# Patient Record
Sex: Male | Born: 1935 | Race: White | Hispanic: No | Marital: Married | Smoking: Never smoker
Health system: Southern US, Community
[De-identification: ages and names within clinical notes are randomized; demographics above are authoritative.]

## PROBLEM LIST (undated history)

## (undated) DIAGNOSIS — M21379 Foot drop, unspecified foot: Secondary | ICD-10-CM

## (undated) DIAGNOSIS — N400 Enlarged prostate without lower urinary tract symptoms: Secondary | ICD-10-CM

## (undated) DIAGNOSIS — C801 Malignant (primary) neoplasm, unspecified: Secondary | ICD-10-CM

## (undated) DIAGNOSIS — G629 Polyneuropathy, unspecified: Secondary | ICD-10-CM

## (undated) DIAGNOSIS — I5032 Chronic diastolic (congestive) heart failure: Secondary | ICD-10-CM

## (undated) DIAGNOSIS — K759 Inflammatory liver disease, unspecified: Secondary | ICD-10-CM

## (undated) DIAGNOSIS — I499 Cardiac arrhythmia, unspecified: Secondary | ICD-10-CM

## (undated) DIAGNOSIS — Z86718 Personal history of other venous thrombosis and embolism: Secondary | ICD-10-CM

## (undated) DIAGNOSIS — M779 Enthesopathy, unspecified: Secondary | ICD-10-CM

## (undated) DIAGNOSIS — N529 Male erectile dysfunction, unspecified: Secondary | ICD-10-CM

## (undated) DIAGNOSIS — M6740 Ganglion, unspecified site: Secondary | ICD-10-CM

## (undated) DIAGNOSIS — R0600 Dyspnea, unspecified: Secondary | ICD-10-CM

## (undated) DIAGNOSIS — J189 Pneumonia, unspecified organism: Secondary | ICD-10-CM

## (undated) DIAGNOSIS — J9621 Acute and chronic respiratory failure with hypoxia: Secondary | ICD-10-CM

## (undated) DIAGNOSIS — J986 Disorders of diaphragm: Secondary | ICD-10-CM

## (undated) DIAGNOSIS — E854 Organ-limited amyloidosis: Secondary | ICD-10-CM

## (undated) DIAGNOSIS — I482 Chronic atrial fibrillation, unspecified: Secondary | ICD-10-CM

## (undated) DIAGNOSIS — I2510 Atherosclerotic heart disease of native coronary artery without angina pectoris: Secondary | ICD-10-CM

## (undated) HISTORY — PX: BACK SURGERY: SHX140

## (undated) HISTORY — DX: Foot drop, unspecified foot: M21.379

## (undated) HISTORY — PX: PEG TUBE PLACEMENT: SUR1034

## (undated) HISTORY — PX: TONSILLECTOMY: SUR1361

## (undated) HISTORY — DX: Male erectile dysfunction, unspecified: N52.9

## (undated) HISTORY — PX: EYE SURGERY: SHX253

## (undated) HISTORY — DX: Enthesopathy, unspecified: M77.9

## (undated) HISTORY — PX: JOINT REPLACEMENT: SHX530

## (undated) HISTORY — DX: Benign prostatic hyperplasia without lower urinary tract symptoms: N40.0

## (undated) HISTORY — DX: Polyneuropathy, unspecified: G62.9

## (undated) HISTORY — DX: Ganglion, unspecified site: M67.40

## (undated) HISTORY — PX: OTHER SURGICAL HISTORY: SHX169

## (undated) HISTORY — PX: TRACHEOSTOMY: SUR1362

## (undated) HISTORY — DX: Atherosclerotic heart disease of native coronary artery without angina pectoris: I25.10

## (undated) HISTORY — PX: ASCENDING AORTIC ANEURYSM REPAIR: SHX1191

---

## 2019-02-15 HISTORY — PX: CARDIAC CATHETERIZATION: SHX172

## 2019-02-22 HISTORY — PX: CORONARY ARTERY BYPASS GRAFT: SHX141

## 2019-02-22 HISTORY — PX: ASCENDING AORTIC ANEURYSM REPAIR: SHX1191

## 2019-03-13 DIAGNOSIS — I639 Cerebral infarction, unspecified: Secondary | ICD-10-CM

## 2019-03-13 HISTORY — DX: Cerebral infarction, unspecified: I63.9

## 2019-06-04 ENCOUNTER — Other Ambulatory Visit (HOSPITAL_COMMUNITY): Payer: Medicare Other

## 2019-06-04 ENCOUNTER — Inpatient Hospital Stay
Admission: RE | Admit: 2019-06-04 | Discharge: 2019-07-10 | Disposition: A | Discharge status: Rehabilitation Facility | Payer: Medicare Other | Source: Other Acute Inpatient Hospital | Attending: Internal Medicine | Admitting: Internal Medicine

## 2019-06-04 DIAGNOSIS — J9690 Respiratory failure, unspecified, unspecified whether with hypoxia or hypercapnia: Secondary | ICD-10-CM

## 2019-06-04 DIAGNOSIS — R509 Fever, unspecified: Secondary | ICD-10-CM

## 2019-06-04 DIAGNOSIS — I482 Chronic atrial fibrillation, unspecified: Secondary | ICD-10-CM | POA: Diagnosis present

## 2019-06-04 DIAGNOSIS — J986 Disorders of diaphragm: Secondary | ICD-10-CM | POA: Diagnosis present

## 2019-06-04 DIAGNOSIS — Z86718 Personal history of other venous thrombosis and embolism: Secondary | ICD-10-CM

## 2019-06-04 DIAGNOSIS — I5032 Chronic diastolic (congestive) heart failure: Secondary | ICD-10-CM | POA: Diagnosis present

## 2019-06-04 DIAGNOSIS — J9621 Acute and chronic respiratory failure with hypoxia: Secondary | ICD-10-CM | POA: Diagnosis present

## 2019-06-04 DIAGNOSIS — Z931 Gastrostomy status: Secondary | ICD-10-CM

## 2019-06-04 DIAGNOSIS — R52 Pain, unspecified: Secondary | ICD-10-CM

## 2019-06-04 HISTORY — DX: Disorders of diaphragm: J98.6

## 2019-06-04 HISTORY — DX: Chronic diastolic (congestive) heart failure: I50.32

## 2019-06-04 HISTORY — DX: Chronic atrial fibrillation, unspecified: I48.20

## 2019-06-04 HISTORY — DX: Personal history of other venous thrombosis and embolism: Z86.718

## 2019-06-04 HISTORY — DX: Acute and chronic respiratory failure with hypoxia: J96.21

## 2019-06-04 LAB — BLOOD GAS, ARTERIAL
Acid-Base Excess: 5.5 mmol/L — ABNORMAL HIGH (ref 0.0–2.0)
Bicarbonate: 29.4 mmol/L — ABNORMAL HIGH (ref 20.0–28.0)
FIO2: 40
O2 Saturation: 97.7 %
Patient temperature: 37
pCO2 arterial: 41.9 mmHg (ref 32.0–48.0)
pH, Arterial: 7.46 — ABNORMAL HIGH (ref 7.350–7.450)
pO2, Arterial: 102 mmHg (ref 83.0–108.0)

## 2019-06-05 ENCOUNTER — Other Ambulatory Visit (HOSPITAL_COMMUNITY): Payer: Medicare Other

## 2019-06-05 DIAGNOSIS — I5032 Chronic diastolic (congestive) heart failure: Secondary | ICD-10-CM | POA: Diagnosis not present

## 2019-06-05 DIAGNOSIS — I482 Chronic atrial fibrillation, unspecified: Secondary | ICD-10-CM

## 2019-06-05 DIAGNOSIS — J986 Disorders of diaphragm: Secondary | ICD-10-CM

## 2019-06-05 DIAGNOSIS — J9621 Acute and chronic respiratory failure with hypoxia: Secondary | ICD-10-CM | POA: Diagnosis not present

## 2019-06-05 DIAGNOSIS — Z86718 Personal history of other venous thrombosis and embolism: Secondary | ICD-10-CM

## 2019-06-05 LAB — CBC
HCT: 29.8 % — ABNORMAL LOW (ref 39.0–52.0)
Hemoglobin: 8.7 g/dL — ABNORMAL LOW (ref 13.0–17.0)
MCH: 26.6 pg (ref 26.0–34.0)
MCHC: 29.2 g/dL — ABNORMAL LOW (ref 30.0–36.0)
MCV: 91.1 fL (ref 80.0–100.0)
Platelets: 303 10*3/uL (ref 150–400)
RBC: 3.27 MIL/uL — ABNORMAL LOW (ref 4.22–5.81)
RDW: 18.2 % — ABNORMAL HIGH (ref 11.5–15.5)
WBC: 6.1 10*3/uL (ref 4.0–10.5)
nRBC: 0 % (ref 0.0–0.2)

## 2019-06-05 LAB — COMPREHENSIVE METABOLIC PANEL
ALT: 36 U/L (ref 0–44)
AST: 40 U/L (ref 15–41)
Albumin: 2.4 g/dL — ABNORMAL LOW (ref 3.5–5.0)
Alkaline Phosphatase: 158 U/L — ABNORMAL HIGH (ref 38–126)
Anion gap: 10 (ref 5–15)
BUN: 20 mg/dL (ref 8–23)
CO2: 29 mmol/L (ref 22–32)
Calcium: 8.9 mg/dL (ref 8.9–10.3)
Chloride: 96 mmol/L — ABNORMAL LOW (ref 98–111)
Creatinine, Ser: 0.67 mg/dL (ref 0.61–1.24)
GFR calc Af Amer: >60 mL/min (ref >60)
GFR calc non Af Amer: >60 mL/min (ref >60)
Glucose, Bld: 99 mg/dL (ref 70–99)
Potassium: 4.1 mmol/L (ref 3.5–5.1)
Sodium: 135 mmol/L (ref 135–145)
Total Bilirubin: 0.4 mg/dL (ref 0.3–1.2)
Total Protein: 6.4 g/dL — ABNORMAL LOW (ref 6.5–8.1)

## 2019-06-05 LAB — PROTIME-INR
INR: 1.1 (ref 0.8–1.2)
Prothrombin Time: 14.1 seconds (ref 11.4–15.2)

## 2019-06-05 LAB — MAGNESIUM: Magnesium: 1.8 mg/dL (ref 1.7–2.4)

## 2019-06-05 LAB — TSH: TSH: 2.456 u[IU]/mL (ref 0.350–4.500)

## 2019-06-05 LAB — APTT: aPTT: 44 seconds — ABNORMAL HIGH (ref 24–36)

## 2019-06-05 MED ORDER — DEXTROMETHORPHAN POLISTIREX ER 30 MG/5ML PO SUER
30.00 | ORAL | Status: DC
Start: ? — End: 2019-06-05

## 2019-06-05 MED ORDER — SODIUM CHLORIDE 0.9 % IV SOLN
0.00 | INTRAVENOUS | Status: DC
Start: ? — End: 2019-06-05

## 2019-06-05 MED ORDER — POTASSIUM CHLORIDE CRYS ER 10 MEQ PO TBCR
20.00 | EXTENDED_RELEASE_TABLET | ORAL | Status: DC
Start: ? — End: 2019-06-05

## 2019-06-05 MED ORDER — GENERIC EXTERNAL MEDICATION
Status: DC
Start: ? — End: 2019-06-05

## 2019-06-05 MED ORDER — POTASSIUM CHLORIDE 20 MEQ/100ML IV SOLN
40.00 | INTRAVENOUS | Status: DC
Start: ? — End: 2019-06-05

## 2019-06-05 MED ORDER — ATORVASTATIN CALCIUM 80 MG PO TABS
80.00 | ORAL_TABLET | ORAL | Status: DC
Start: 2019-06-05 — End: 2019-06-05

## 2019-06-05 MED ORDER — SENNOSIDES 8.6 MG PO TABS
2.00 | ORAL_TABLET | ORAL | Status: DC
Start: ? — End: 2019-06-05

## 2019-06-05 MED ORDER — POTASSIUM CHLORIDE 40 MEQ/100ML IV SOLN
40.00 | INTRAVENOUS | Status: DC
Start: ? — End: 2019-06-05

## 2019-06-05 MED ORDER — DEXTROSE 5 % IV SOLN
42.00 | INTRAVENOUS | Status: DC
Start: ? — End: 2019-06-05

## 2019-06-05 MED ORDER — GUAIFENESIN 100 MG/5ML PO SYRP
10.00 | ORAL_SOLUTION | ORAL | Status: DC
Start: ? — End: 2019-06-05

## 2019-06-05 MED ORDER — POLYETHYLENE GLYCOL 3350 17 GM/SCOOP PO POWD
17.00 | ORAL | Status: DC
Start: 2019-06-05 — End: 2019-06-05

## 2019-06-05 MED ORDER — ENOXAPARIN SODIUM 40 MG/0.4ML ~~LOC~~ SOLN
40.00 | SUBCUTANEOUS | Status: DC
Start: 2019-06-05 — End: 2019-06-05

## 2019-06-05 MED ORDER — POTASSIUM CHLORIDE 20 MEQ PO PACK
40.00 | PACK | ORAL | Status: DC
Start: ? — End: 2019-06-05

## 2019-06-05 MED ORDER — MELATONIN 3 MG PO TABS
3.00 | ORAL_TABLET | ORAL | Status: DC
Start: ? — End: 2019-06-05

## 2019-06-05 MED ORDER — ASPIRIN 81 MG PO CHEW
81.00 | CHEWABLE_TABLET | ORAL | Status: DC
Start: 2019-06-05 — End: 2019-06-05

## 2019-06-05 MED ORDER — CARBOXYMETHYLCELLULOSE SODIUM 1 % OP GEL
1.00 | OPHTHALMIC | Status: DC
Start: ? — End: 2019-06-05

## 2019-06-05 MED ORDER — POTASSIUM CHLORIDE 20 MEQ/100ML IV SOLN
20.00 | INTRAVENOUS | Status: DC
Start: ? — End: 2019-06-05

## 2019-06-05 MED ORDER — GENERIC EXTERNAL MEDICATION
15.00 | Status: DC
Start: ? — End: 2019-06-05

## 2019-06-05 MED ORDER — GENERIC EXTERNAL MEDICATION
30.00 | Status: DC
Start: 2019-06-05 — End: 2019-06-05

## 2019-06-05 MED ORDER — ONDANSETRON HCL 4 MG/2ML IJ SOLN
4.00 | INTRAMUSCULAR | Status: DC
Start: ? — End: 2019-06-05

## 2019-06-05 MED ORDER — GENERIC EXTERNAL MEDICATION
12.50 | Status: DC
Start: 2019-06-04 — End: 2019-06-05

## 2019-06-05 MED ORDER — POTASSIUM CHLORIDE 20 MEQ PO PACK
20.00 | PACK | ORAL | Status: DC
Start: ? — End: 2019-06-05

## 2019-06-05 MED ORDER — DEXTROSE 50 % IV SOLN
12.50 | INTRAVENOUS | Status: DC
Start: ? — End: 2019-06-05

## 2019-06-05 MED ORDER — FINASTERIDE 5 MG PO TABS
5.00 | ORAL_TABLET | ORAL | Status: DC
Start: 2019-06-05 — End: 2019-06-05

## 2019-06-05 MED ORDER — IPRATROPIUM-ALBUTEROL 0.5-2.5 (3) MG/3ML IN SOLN
3.00 | RESPIRATORY_TRACT | Status: DC
Start: ? — End: 2019-06-05

## 2019-06-05 MED ORDER — POTASSIUM CHLORIDE CRYS ER 10 MEQ PO TBCR
40.00 | EXTENDED_RELEASE_TABLET | ORAL | Status: DC
Start: ? — End: 2019-06-05

## 2019-06-05 MED ORDER — DEXTROSE 50 % IV SOLN
25.00 | INTRAVENOUS | Status: DC
Start: ? — End: 2019-06-05

## 2019-06-05 MED ORDER — MAGNESIUM SULFATE 2 GM/50ML IV SOLN
6.00 | INTRAVENOUS | Status: DC
Start: ? — End: 2019-06-05

## 2019-06-05 MED ORDER — MAGNESIUM SULFATE 2 GM/50ML IV SOLN
2.00 | INTRAVENOUS | Status: DC
Start: ? — End: 2019-06-05

## 2019-06-05 MED ORDER — FUROSEMIDE 20 MG PO TABS
20.00 | ORAL_TABLET | ORAL | Status: DC
Start: 2019-06-05 — End: 2019-06-05

## 2019-06-05 MED ORDER — GLUCAGON (RDNA) 1 MG IJ KIT
1.00 | PACK | INTRAMUSCULAR | Status: DC
Start: ? — End: 2019-06-05

## 2019-06-05 MED ORDER — ACETAMINOPHEN 325 MG PO TABS
650.00 | ORAL_TABLET | ORAL | Status: DC
Start: ? — End: 2019-06-05

## 2019-06-05 MED ORDER — CHLORHEXIDINE GLUCONATE 0.12 % MT SOLN
15.00 | OROMUCOSAL | Status: DC
Start: 2019-06-04 — End: 2019-06-05

## 2019-06-05 MED ORDER — METOCLOPRAMIDE HCL 5 MG PO TABS
10.00 | ORAL_TABLET | ORAL | Status: DC
Start: 2019-06-04 — End: 2019-06-05

## 2019-06-05 MED ORDER — MAGNESIUM SULFATE 4 GM/100ML IV SOLN
4.00 | INTRAVENOUS | Status: DC
Start: ? — End: 2019-06-05

## 2019-06-05 NOTE — Consult Note (Signed)
Pulmonary Critical Care Medicine Travis Brewer: 06/05/2019  PULMONARY CRITICAL CARE [name]  [name]  DOB: 1934-10-18   DOA: 06/04/2019  Referring Physician: Merton Border, MD  HPI: Travis Brewer is a 85 y.o. male seen for follow up of Acute on Chronic Respiratory Failure.  Patient has multiple medical problems including ascending aortic aneurysm atrial fibrillation melanoma syncope pneumonia delirium who was admitted to the hospital after a 5.3 cm ascending aortic aneurysm repair and coronary artery disease with a history of a CABG.  Patient apparently had been having some issues with syncope was found to have multivessel coronary artery disease as well as the aneurysm underwent a CABG as well as repair complicated by a carbon dioxide embolism from the vein harvest.  He developed atrial fibrillation postoperatively also requiring amiodarone.  Patient also had respiratory failure with increased PCO2 and required ongoing intubation and chemical ventilation.  Patient at that time had been discharged was readmitted on December 4 for low sodium and was felt to be in volume overload with SIADH.  Fluid restriction was done and patient also was placed on the ventilator.  Basically has not been able to come off of the ventilator he also apparently had a sniff test showing a right hemidiaphragm paralysis and left hemidiaphragm which was greatly diminished and movement suggesting paresis.  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  Patient currently has been on pressure support wean and we are trying to gradually increase this time.  Review of Systems:  ROS performed and is unremarkable other than noted above.  Past Medical History:  Diagnosis Date  . Acute respiratory failure requiring reintubation 03/06/2019  . Arthritis  . Ascending aortic aneurysm 02/22/2019  .  Atrial fibrillation 02/2019  . Melanoma 2010  left shoulder  . Near syncope  . Palliative care by specialist 04/03/2019  . Pneumonia 19454  pt was 85 years old  . Postoperative delirium 02/2019  . Rotator cuff dysfunction, left 1995   Past Surgical History:  Procedure Laterality Date  . ASCENDING AORTIC ANEURYSM REPAIR N/A 02/22/2019  Procedure: REPAIR, ANEURYSM, AORTA, ASCENDING, EVH; Surgeon: Travis Cork, MD; Location: [facility] ZBOR 5; Brewer: Cardiac Surgery; Laterality: N/A;  . BACK SURGERY 2010  . CORONARY ARTERIOGRAPHY N/A 02/15/2019  Procedure: Coronary Arteriography; Surgeon: Travis Garin, MD; Location: [facility] CVIL; Brewer: Cardiac Catheterization; Laterality: N/A;  . CORONARY ARTERY BYPASS GRAFT N/A 02/22/2019  Procedure: CABG (CORONARY ARTERY BYPASS GRAFT; Surgeon: Travis Cork, MD; Location: [facility] 5; Brewer: Cardiac Surgery; Laterality: N/A;  . FOOT BONE SPUR EXCISION  . GANGLION CYST EXCISION Left  . KNEE REPLACEMENT TOTAL Bilateral left knee 2012, right knee 2020   Immunization History: There is no immunization history on file for this patient.  Social History Social History   Tobacco Use  . Smoking status: Never Smoker  . Smokeless tobacco: Never Used  Substance Use Topics  . Alcohol use: Yes  Comment: 10 alcoholic beverages a week  . Drug use: Not on file    Family History  Problem Relation Age of Onset  . Stroke Father  . Diabetes Brother    Medications: Reviewed on Rounds  Physical Exam:  Vitals: Temperature is 98.4 pulse 98 respiratory rate 16 blood pressure is 102/64 saturations 100%  Ventilator Settings mode of ventilation assist control FiO2 40% tidal volume 400 PEEP 5 respiratory rate set at 16  .  General: Comfortable at this time . Eyes: Grossly normal lids, irises & conjunctiva . ENT: grossly tongue is normal . Neck: no obvious mass . Cardiovascular: S1-S2 normal no gallop or rub . Respiratory: No rhonchi no rales are noted at  this time . Abdomen: Soft and nontender . Skin: no rash seen on limited exam . Musculoskeletal: not rigid . Psychiatric:unable to assess . Neurologic: no seizure no involuntary movements         Labs on Admission:  Basic Metabolic Panel: Recent Labs  Lab 06/05/19 0612  NA 135  K 4.1  CL 96*  CO2 29  GLUCOSE 99  BUN 20  CREATININE 0.67  CALCIUM 8.9  MG 1.8    Recent Labs  Lab 06/04/19 1932  PHART 7.460*  PCO2ART 41.9  PO2ART 102  HCO3 29.4*  O2SAT 97.7    Liver Function Tests: Recent Labs  Lab 06/05/19 0612  AST 40  ALT 36  ALKPHOS 158*  BILITOT 0.4  PROT 6.4*  ALBUMIN 2.4*   No results for input(s): LIPASE, AMYLASE in the last 168 hours. No results for input(s): AMMONIA in the last 168 hours.  CBC: Recent Labs  Lab 06/05/19 0612  WBC 6.1  HGB 8.7*  HCT 29.8*  MCV 91.1  PLT 303    Cardiac Enzymes: No results for input(s): CKTOTAL, CKMB, CKMBINDEX, TROPONINI in the last 168 hours.  BNP (last 3 results) No results for input(s): BNP in the last 8760 hours.  ProBNP (last 3 results) No results for input(s): PROBNP in the last 8760 hours.   Radiological Exams on Admission: DG ABDOMEN PEG TUBE LOCATION  Result Date: 06/04/2019 CLINICAL DATA:  PEG placement. EXAM: ABDOMEN - 1 VIEW COMPARISON:  None. FINDINGS: Enteric tube administered through indwelling gastrostomy tube. Contrast opacifies the stomach, duodenum, proximal small bowel. No evidence of extravasation or leak. Air-filled but nondilated small and large bowel in the abdomen. Bilateral pleural effusions suspected at the lung bases. IMPRESSION: Enteric tube in the stomach.  No evidence of extravasation or leak. Electronically Signed   By: Travis Brewer M.D.   On: 06/04/2019 20:25   DG CHEST PORT 1 VIEW  Result Date: 06/05/2019 CLINICAL DATA:  Respiratory failure, tracheostomy EXAM: PORTABLE CHEST 1 VIEW COMPARISON:  None FINDINGS: Tracheostomy tube terminates in the mid trachea.  Postsurgical changes related to prior CABG including intact and aligned sternotomy wires and multiple surgical clips projecting over the mediastinum. Mild cardiomegaly. There are hazy interstitial opacities with indistinct vascularity which could reflect a combination of edema and atelectasis. Small bilateral effusions. No pneumothorax. No acute osseous or soft tissue abnormality. Moderate to severe degenerative changes are noted in both shoulders, left greater than right. IMPRESSION: Mild cardiomegaly with hazy interstitial opacities which could reflect a combination of edema and atelectasis. Small bilateral effusions. Electronically Signed   By: Lovena Le M.D.   On: 06/05/2019 06:43    Assessment/Plan Active Problems:   Acute on chronic respiratory failure with hypoxia [facility])   Diaphragmatic paralysis   History of DVT of lower extremity   Chronic congestive heart failure with left ventricular diastolic dysfunction (HCC)   Chronic atrial fibrillation ([place])   1. Acute on chronic respiratory failure with hypoxia patient is right now on full support on the ventilator.  Chest x-ray shows some interstitial opacities noted plan is going to be to monitor the patient's fluid status diurese as tolerated and I will check the RSB I and try to begin with the weaning protocol.  Multiple  factors are involved here including paresis of his diaphragm with phrenic nerve damage also. 2. Chronic atrial fibrillation rate controlled patient has been on amiodarone which was ultimately taken off. 3. History of DVT patient had a right popliteal and left posterior tibial DVT but was not able to be anticoagulated because of angiopathy. 4. Chronic congestive heart failure preserved ejection fraction patient has been diuresed we will continue with the diuretics and monitor closely last chest x-ray does show cardiomegaly with some increasing interstitial opacities. 5. Diaphragmatic paralysis this is going to be difficult in  terms of being able to wean the patient will see how patient does ultimately it may end up that the patient will require some form of ventilatory support likely at nighttime  I have personally seen and evaluated the patient, evaluated laboratory and imaging results, formulated the assessment and plan and placed orders. The Patient requires high complexity decision making with multiple systems involvement.  Case was discussed on Rounds with the Respiratory Therapy Director and the Respiratory staff Time Spent 66minutes  Kyvon Hu A Etta Gassett, MD [facility] Pulmonary Critical Care Medicine Sleep Medicine

## 2019-06-06 ENCOUNTER — Other Ambulatory Visit (HOSPITAL_COMMUNITY): Payer: Medicare Other

## 2019-06-06 ENCOUNTER — Encounter: Payer: Self-pay | Admitting: Internal Medicine

## 2019-06-06 DIAGNOSIS — I5032 Chronic diastolic (congestive) heart failure: Secondary | ICD-10-CM | POA: Diagnosis present

## 2019-06-06 DIAGNOSIS — J986 Disorders of diaphragm: Secondary | ICD-10-CM | POA: Diagnosis present

## 2019-06-06 DIAGNOSIS — I482 Chronic atrial fibrillation, unspecified: Secondary | ICD-10-CM | POA: Diagnosis not present

## 2019-06-06 DIAGNOSIS — J9621 Acute and chronic respiratory failure with hypoxia: Secondary | ICD-10-CM | POA: Diagnosis not present

## 2019-06-06 DIAGNOSIS — Z86718 Personal history of other venous thrombosis and embolism: Secondary | ICD-10-CM

## 2019-06-06 NOTE — Progress Notes (Addendum)
Pulmonary Critical Care Medicine [place]   PULMONARY CRITICAL CARE SERVICE  PROGRESS NOTE  Date of Service: 06/06/2019  Travis Brewer  [id number]  DOB: May 22, 1934   DOA: 06/04/2019  Referring Physician: Merton Border, MD  HPI: Travis Brewer is a 85 y.o. male seen for follow up of Acute on Chronic Respiratory Failure.  Patient remains on pressure support this time 25 FiO2 40% for 12-hour goal satting well no fever distress.  Medications: Reviewed on Rounds  Physical Exam:  Vitals: Pulse 91 respirations 20 BP 97/50 O2 sat 100% temp 98.8  Ventilator Settings pressure support 12/5 FiO2 40%  . General: Comfortable at this time . Eyes: Grossly normal lids, irises & conjunctiva . ENT: grossly tongue is normal . Neck: no obvious mass . Cardiovascular: S1 S2 normal no gallop . Respiratory: No rales or rhonchi noted . Abdomen: soft . Skin: no rash seen on limited exam . Musculoskeletal: not rigid . Psychiatric:unable to assess . Neurologic: no seizure no involuntary movements         Lab Data:   Basic Metabolic Panel: Recent Labs  Lab 06/05/19 0612  NA 135  K 4.1  CL 96*  CO2 29  GLUCOSE 99  BUN 20  CREATININE 0.67  CALCIUM 8.9  MG 1.8    ABG: Recent Labs  Lab 06/04/19 1932  PHART 7.460*  PCO2ART 41.9  PO2ART 102  HCO3 29.4*  O2SAT 97.7    Liver Function Tests: Recent Labs  Lab 06/05/19 0612  AST 40  ALT 36  ALKPHOS 158*  BILITOT 0.4  PROT 6.4*  ALBUMIN 2.4*   No results for input(s): LIPASE, AMYLASE in the last 168 hours. No results for input(s): AMMONIA in the last 168 hours.  CBC: Recent Labs  Lab 06/05/19 0612  WBC 6.1  HGB 8.7*  HCT 29.8*  MCV 91.1  PLT 303    Cardiac Enzymes: No results for input(s): CKTOTAL, CKMB, CKMBINDEX, TROPONINI in the last 168 hours.  BNP (last 3 results) No results for input(s): BNP in the last 8760 hours.  ProBNP (last 3 results) No results for input(s): PROBNP in the last  8760 hours.  Radiological Exams: DG ABDOMEN PEG TUBE LOCATION  Result Date: 06/04/2019 CLINICAL DATA:  PEG placement. EXAM: ABDOMEN - 1 VIEW COMPARISON:  None. FINDINGS: Enteric tube administered through indwelling gastrostomy tube. Contrast opacifies the stomach, duodenum, proximal small bowel. No evidence of extravasation or leak. Air-filled but nondilated small and large bowel in the abdomen. Bilateral pleural effusions suspected at the lung bases. IMPRESSION: Enteric tube in the stomach.  No evidence of extravasation or leak. Electronically Signed   By: Keith Rake M.D.   On: 06/04/2019 20:25   DG CHEST PORT 1 VIEW  Result Date: 06/06/2019 CLINICAL DATA:  Follow-up pleural effusions. EXAM: PORTABLE CHEST 1 VIEW COMPARISON:  06/05/2019 FINDINGS: The cardiac silhouette, mediastinal and hilar contours are within normal limits and stable given the AP projection and portable technique. The tracheostomy tube is in good position, unchanged. Persistent small right pleural effusion and bibasilar atelectasis. No definite infiltrates or edema. No pneumothorax. Stable advanced degenerative changes involving both shoulders. IMPRESSION: Persistent small right pleural effusion and bibasilar atelectasis. Electronically Signed   By: Marijo Sanes M.D.   On: 06/06/2019 16:21   DG CHEST PORT 1 VIEW  Result Date: 06/05/2019 CLINICAL DATA:  Respiratory failure, tracheostomy EXAM: PORTABLE CHEST 1 VIEW COMPARISON:  None FINDINGS: Tracheostomy tube terminates in the mid trachea. Postsurgical changes related  to prior CABG including intact and aligned sternotomy wires and multiple surgical clips projecting over the mediastinum. Mild cardiomegaly. There are hazy interstitial opacities with indistinct vascularity which could reflect a combination of edema and atelectasis. Small bilateral effusions. No pneumothorax. No acute osseous or soft tissue abnormality. Moderate to severe degenerative changes are noted in both  shoulders, left greater than right. IMPRESSION: Mild cardiomegaly with hazy interstitial opacities which could reflect a combination of edema and atelectasis. Small bilateral effusions. Electronically Signed   By: Lovena Le M.D.   On: 06/05/2019 06:43    Assessment/Plan Active Problems:   * No active hospital problems. *   1. Acute on chronic respiratory failure with hypoxia patient remains on full support on the ventilator at this time satting well we will continue supportive measures and consider weaning when patient is able. 2. Chronic atrial fibrillation rate controlled continue to follow 3. History of DVT patient had right popliteal and left posterior tibial DVT was not able to be anticoagulated because of angiopathy. 4. Chronic congestive heart failure with preserved ejection fraction has been diuresed continue to monitor closely 5. Diaphragmatic paralysis will make weaning difficult from the ventilator.  Continue to monitor closely.   I have personally seen and evaluated the patient, evaluated laboratory and imaging results, formulated the assessment and plan and placed orders. The Patient requires high complexity decision making with multiple systems involvement.  Rounds were done with the Respiratory Therapy Director and Staff therapists and discussed with nursing staff also.  Allyne Gee, MD [facility] Pulmonary Critical Care Medicine Sleep Medicine

## 2019-06-07 DIAGNOSIS — I482 Chronic atrial fibrillation, unspecified: Secondary | ICD-10-CM | POA: Diagnosis not present

## 2019-06-07 DIAGNOSIS — J986 Disorders of diaphragm: Secondary | ICD-10-CM | POA: Diagnosis not present

## 2019-06-07 DIAGNOSIS — I5032 Chronic diastolic (congestive) heart failure: Secondary | ICD-10-CM | POA: Diagnosis not present

## 2019-06-07 DIAGNOSIS — J9621 Acute and chronic respiratory failure with hypoxia: Secondary | ICD-10-CM | POA: Diagnosis not present

## 2019-06-07 LAB — BASIC METABOLIC PANEL
Anion gap: 9 (ref 5–15)
BUN: 27 mg/dL — ABNORMAL HIGH (ref 8–23)
CO2: 32 mmol/L (ref 22–32)
Calcium: 8.7 mg/dL — ABNORMAL LOW (ref 8.9–10.3)
Chloride: 96 mmol/L — ABNORMAL LOW (ref 98–111)
Creatinine, Ser: 0.65 mg/dL (ref 0.61–1.24)
GFR calc Af Amer: >60 mL/min (ref >60)
GFR calc non Af Amer: >60 mL/min (ref >60)
Glucose, Bld: 130 mg/dL — ABNORMAL HIGH (ref 70–99)
Potassium: 4.3 mmol/L (ref 3.5–5.1)
Sodium: 137 mmol/L (ref 135–145)

## 2019-06-07 LAB — CBC
HCT: 27.2 % — ABNORMAL LOW (ref 39.0–52.0)
Hemoglobin: 8.1 g/dL — ABNORMAL LOW (ref 13.0–17.0)
MCH: 27.1 pg (ref 26.0–34.0)
MCHC: 29.8 g/dL — ABNORMAL LOW (ref 30.0–36.0)
MCV: 91 fL (ref 80.0–100.0)
Platelets: 252 10*3/uL (ref 150–400)
RBC: 2.99 MIL/uL — ABNORMAL LOW (ref 4.22–5.81)
RDW: 18.4 % — ABNORMAL HIGH (ref 11.5–15.5)
WBC: 5.7 10*3/uL (ref 4.0–10.5)
nRBC: 0 % (ref 0.0–0.2)

## 2019-06-07 NOTE — Progress Notes (Signed)
Pulmonary Critical Care Medicine [place]   PULMONARY CRITICAL CARE SERVICE  PROGRESS NOTE  Date of Service: 06/07/2019  Travis Brewer  [id number]  DOB: Jan 03, 1935   DOA: 06/04/2019  Referring Physician: Merton Border, MD  HPI: Travis Brewer is a 85 y.o. male seen for follow up of Acute on Chronic Respiratory Failure.  Patient currently is on full support pressure support wean 12/5  Medications: Reviewed on Rounds  Physical Exam:  Vitals: Temperature is 100.2 pulse 90 respiratory rate 22 blood pressure is one 4/80 saturations 96%  Ventilator Settings mode of ventilation pressure support FiO2 30% tidal volume is 350 pressure poor 12 PEEP 5  . General: Comfortable at this time . Eyes: Grossly normal lids, irises & conjunctiva . ENT: grossly tongue is normal . Neck: no obvious mass . Cardiovascular: S1 S2 normal no gallop . Respiratory: No rhonchi coarse breath sounds . Abdomen: soft . Skin: no rash seen on limited exam . Musculoskeletal: not rigid . Psychiatric:unable to assess . Neurologic: no seizure no involuntary movements         Lab Data:   Basic Metabolic Panel: Recent Labs  Lab 06/05/19 0612 06/07/19 0703  NA 135 137  K 4.1 4.3  CL 96* 96*  CO2 29 32  GLUCOSE 99 130*  BUN 20 27*  CREATININE 0.67 0.65  CALCIUM 8.9 8.7*  MG 1.8  --     ABG: Recent Labs  Lab 06/04/19 1932  PHART 7.460*  PCO2ART 41.9  PO2ART 102  HCO3 29.4*  O2SAT 97.7    Liver Function Tests: Recent Labs  Lab 06/05/19 0612  AST 40  ALT 36  ALKPHOS 158*  BILITOT 0.4  PROT 6.4*  ALBUMIN 2.4*   No results for input(s): LIPASE, AMYLASE in the last 168 hours. No results for input(s): AMMONIA in the last 168 hours.  CBC: Recent Labs  Lab 06/05/19 0612 06/07/19 0703  WBC 6.1 5.7  HGB 8.7* 8.1*  HCT 29.8* 27.2*  MCV 91.1 91.0  PLT 303 252    Cardiac Enzymes: No results for input(s): CKTOTAL, CKMB, CKMBINDEX, TROPONINI in the last 168  hours.  BNP (last 3 results) No results for input(s): BNP in the last 8760 hours.  ProBNP (last 3 results) No results for input(s): PROBNP in the last 8760 hours.  Radiological Exams: DG CHEST PORT 1 VIEW  Result Date: 06/06/2019 CLINICAL DATA:  Follow-up pleural effusions. EXAM: PORTABLE CHEST 1 VIEW COMPARISON:  06/05/2019 FINDINGS: The cardiac silhouette, mediastinal and hilar contours are within normal limits and stable given the AP projection and portable technique. The tracheostomy tube is in good position, unchanged. Persistent small right pleural effusion and bibasilar atelectasis. No definite infiltrates or edema. No pneumothorax. Stable advanced degenerative changes involving both shoulders. IMPRESSION: Persistent small right pleural effusion and bibasilar atelectasis. Electronically Signed   By: Marijo Sanes M.D.   On: 06/06/2019 16:21    Assessment/Plan Active Problems:   Acute on chronic respiratory failure with hypoxia (HCC)   Diaphragmatic paralysis   History of DVT of lower extremity   Chronic congestive heart failure with left ventricular diastolic dysfunction (HCC)   Chronic atrial fibrillation ([place])   1. Acute on chronic respiratory failure hypoxia plan is to continue with pressure support as ordered titrate oxygen continue pulmonary toilet. 2. Diaphragmatic paralysis this will be difficult in terms of being able to be completely liberated from the ventilator 3. DVT treated 4. Congestive heart failure compensated we'll continue to monitor.  Last chest x-ray showing small right-sided pleural effusions 5. Chronic atrial fibrillation right now rate controlled   I have personally seen and evaluated the patient, evaluated laboratory and imaging results, formulated the assessment and plan and placed orders. The Patient requires high complexity decision making with multiple systems involvement.  Rounds were done with the Respiratory Therapy Director and Staff therapists  and discussed with nursing staff also.  Allyne Gee, MD [facility] Pulmonary Critical Care Medicine Sleep Medicine

## 2019-06-08 DIAGNOSIS — J986 Disorders of diaphragm: Secondary | ICD-10-CM | POA: Diagnosis not present

## 2019-06-08 DIAGNOSIS — I5032 Chronic diastolic (congestive) heart failure: Secondary | ICD-10-CM | POA: Diagnosis not present

## 2019-06-08 DIAGNOSIS — J9621 Acute and chronic respiratory failure with hypoxia: Secondary | ICD-10-CM | POA: Diagnosis not present

## 2019-06-08 DIAGNOSIS — I482 Chronic atrial fibrillation, unspecified: Secondary | ICD-10-CM | POA: Diagnosis not present

## 2019-06-08 MED ORDER — GENERIC EXTERNAL MEDICATION
Status: DC
Start: ? — End: 2019-06-08

## 2019-06-08 NOTE — Progress Notes (Addendum)
Pulmonary Critical Care Medicine [place]   PULMONARY CRITICAL CARE SERVICE  PROGRESS NOTE  Date of Service: 06/08/2019  Travis Brewer  [id number]  DOB: 1934-06-25   DOA: 06/04/2019  Referring Physician: Merton Border, MD  HPI: Travis Brewer is a 85 y.o. male seen for follow up of Acute on Chronic Respiratory Failure.  Patient did 1/2 hours on aerosol trach collar 35% today is now back on pressure support 12/5 FiO2 40% satting well no fever distress.  Medications: Reviewed on Rounds  Physical Exam:  Vitals: Pulse 89 respirations 22 BP 137/95 O2 sat 100% temp 100.5  Ventilator Settings pressure support 12/5 FiO2 40%  . General: Comfortable at this time . Eyes: Grossly normal lids, irises & conjunctiva . ENT: grossly tongue is normal . Neck: no obvious mass . Cardiovascular: S1 S2 normal no gallop . Respiratory: No rales or rhonchi noted . Abdomen: soft . Skin: no rash seen on limited exam . Musculoskeletal: not rigid . Psychiatric:unable to assess . Neurologic: no seizure no involuntary movements         Lab Data:   Basic Metabolic Panel: Recent Labs  Lab 06/05/19 0612 06/07/19 0703  NA 135 137  K 4.1 4.3  CL 96* 96*  CO2 29 32  GLUCOSE 99 130*  BUN 20 27*  CREATININE 0.67 0.65  CALCIUM 8.9 8.7*  MG 1.8  --     ABG: Recent Labs  Lab 06/04/19 1932  PHART 7.460*  PCO2ART 41.9  PO2ART 102  HCO3 29.4*  O2SAT 97.7    Liver Function Tests: Recent Labs  Lab 06/05/19 0612  AST 40  ALT 36  ALKPHOS 158*  BILITOT 0.4  PROT 6.4*  ALBUMIN 2.4*   No results for input(s): LIPASE, AMYLASE in the last 168 hours. No results for input(s): AMMONIA in the last 168 hours.  CBC: Recent Labs  Lab 06/05/19 0612 06/07/19 0703  WBC 6.1 5.7  HGB 8.7* 8.1*  HCT 29.8* 27.2*  MCV 91.1 91.0  PLT 303 252    Cardiac Enzymes: No results for input(s): CKTOTAL, CKMB, CKMBINDEX, TROPONINI in the last 168 hours.  BNP (last 3 results) No  results for input(s): BNP in the last 8760 hours.  ProBNP (last 3 results) No results for input(s): PROBNP in the last 8760 hours.  Radiological Exams: No results found.  Assessment/Plan Active Problems:   Acute on chronic respiratory failure with hypoxia (HCC)   Diaphragmatic paralysis   History of DVT of lower extremity   Chronic congestive heart failure with left ventricular diastolic dysfunction (HCC)   Chronic atrial fibrillation ([place])   1. Acute on chronic respiratory failure hypoxia plan is to continue weaning per protocol.  Did 1/2 hours on ATC today currently on pressure support mode.  Continue supportive measures and pulmonary toilet. 2. Diaphragmatic paralysis this will be difficult in terms of being able to be completely liberated from the ventilator 3. DVT treated 4. Congestive heart failure compensated we'll continue to monitor.  Last chest x-ray showing small right-sided pleural effusions 5. Chronic atrial fibrillation right now rate controlled   I have personally seen and evaluated the patient, evaluated laboratory and imaging results, formulated the assessment and plan and placed orders. The Patient requires high complexity decision making with multiple systems involvement.  Rounds were done with the Respiratory Therapy Director and Staff therapists and discussed with nursing staff also.  Allyne Gee, MD [facility] Pulmonary Critical Care Medicine Sleep Medicine

## 2019-06-09 DIAGNOSIS — J986 Disorders of diaphragm: Secondary | ICD-10-CM | POA: Diagnosis not present

## 2019-06-09 DIAGNOSIS — J9621 Acute and chronic respiratory failure with hypoxia: Secondary | ICD-10-CM | POA: Diagnosis not present

## 2019-06-09 DIAGNOSIS — I482 Chronic atrial fibrillation, unspecified: Secondary | ICD-10-CM | POA: Diagnosis not present

## 2019-06-09 DIAGNOSIS — I5032 Chronic diastolic (congestive) heart failure: Secondary | ICD-10-CM | POA: Diagnosis not present

## 2019-06-09 LAB — BASIC METABOLIC PANEL
Anion gap: 9 (ref 5–15)
BUN: 32 mg/dL — ABNORMAL HIGH (ref 8–23)
CO2: 30 mmol/L (ref 22–32)
Calcium: 8.5 mg/dL — ABNORMAL LOW (ref 8.9–10.3)
Chloride: 99 mmol/L (ref 98–111)
Creatinine, Ser: 0.76 mg/dL (ref 0.61–1.24)
GFR calc Af Amer: >60 mL/min (ref >60)
GFR calc non Af Amer: >60 mL/min (ref >60)
Glucose, Bld: 121 mg/dL — ABNORMAL HIGH (ref 70–99)
Potassium: 4.2 mmol/L (ref 3.5–5.1)
Sodium: 138 mmol/L (ref 135–145)

## 2019-06-09 LAB — CBC
HCT: 25.5 % — ABNORMAL LOW (ref 39.0–52.0)
Hemoglobin: 7.4 g/dL — ABNORMAL LOW (ref 13.0–17.0)
MCH: 26.3 pg (ref 26.0–34.0)
MCHC: 29 g/dL — ABNORMAL LOW (ref 30.0–36.0)
MCV: 90.7 fL (ref 80.0–100.0)
Platelets: 200 10*3/uL (ref 150–400)
RBC: 2.81 MIL/uL — ABNORMAL LOW (ref 4.22–5.81)
RDW: 18.7 % — ABNORMAL HIGH (ref 11.5–15.5)
WBC: 5.8 10*3/uL (ref 4.0–10.5)
nRBC: 0 % (ref 0.0–0.2)

## 2019-06-09 NOTE — Progress Notes (Addendum)
Pulmonary Critical Care Medicine [place]   PULMONARY CRITICAL CARE SERVICE  PROGRESS NOTE  Date of Service: 06/09/2019  CHANON CROES  [id number]  DOB: 10/23/1934   DOA: 06/04/2019  Referring Physician: Merton Border, MD  HPI: JONATHEN CALLUM is a 85 y.o. male seen for follow up of Acute on Chronic Respiratory Failure.  Patient did 6 hours today on aerosol trach collar 40% FiO2 and is now resting back on pressure support satting well no fever or distress.  Medications: Reviewed on Rounds  Physical Exam:  Vitals: Pulse 96 respirations 32 BP 102/51 O2 sat 90% temp 98.7  Ventilator Settings pressure support over 5 FiO2 35%  . General: Comfortable at this time . Eyes: Grossly normal lids, irises & conjunctiva . ENT: grossly tongue is normal . Neck: no obvious mass . Cardiovascular: S1 S2 normal no gallop . Respiratory: No rales or rhonchi noted . Abdomen: soft . Skin: no rash seen on limited exam . Musculoskeletal: not rigid . Psychiatric:unable to assess . Neurologic: no seizure no involuntary movements         Lab Data:   Basic Metabolic Panel: Recent Labs  Lab 06/05/19 0612 06/07/19 0703 06/09/19 0659  NA 135 137 138  K 4.1 4.3 4.2  CL 96* 96* 99  CO2 29 32 30  GLUCOSE 99 130* 121*  BUN 20 27* 32*  CREATININE 0.67 0.65 0.76  CALCIUM 8.9 8.7* 8.5*  MG 1.8  --   --     ABG: Recent Labs  Lab 06/04/19 1932  PHART 7.460*  PCO2ART 41.9  PO2ART 102  HCO3 29.4*  O2SAT 97.7    Liver Function Tests: Recent Labs  Lab 06/05/19 0612  AST 40  ALT 36  ALKPHOS 158*  BILITOT 0.4  PROT 6.4*  ALBUMIN 2.4*   No results for input(s): LIPASE, AMYLASE in the last 168 hours. No results for input(s): AMMONIA in the last 168 hours.  CBC: Recent Labs  Lab 06/05/19 0612 06/07/19 0703 06/09/19 0659  WBC 6.1 5.7 5.8  HGB 8.7* 8.1* 7.4*  HCT 29.8* 27.2* 25.5*  MCV 91.1 91.0 90.7  PLT 303 252 200    Cardiac Enzymes: No results for  input(s): CKTOTAL, CKMB, CKMBINDEX, TROPONINI in the last 168 hours.  BNP (last 3 results) No results for input(s): BNP in the last 8760 hours.  ProBNP (last 3 results) No results for input(s): PROBNP in the last 8760 hours.  Radiological Exams: No results found.  Assessment/Plan Active Problems:   Acute on chronic respiratory failure with hypoxia (HCC)   Diaphragmatic paralysis   History of DVT of lower extremity   Chronic congestive heart failure with left ventricular diastolic dysfunction (HCC)   Chronic atrial fibrillation ([place])   1. Acute on chronic respiratory failure hypoxia plan is to continue with pressure support as ordered titrate oxygen continue pulmonary toilet. 2. Diaphragmatic paralysis this will be difficult in terms of being able to be completely liberated from the ventilator 3. DVT treated 4. Congestive heart failure compensated we'll continue to monitor.  Last chest x-ray showing small right-sided pleural effusions 5. Chronic atrial fibrillation right now rate controlled   I have personally seen and evaluated the patient, evaluated laboratory and imaging results, formulated the assessment and plan and placed orders. The Patient requires high complexity decision making with multiple systems involvement.  Rounds were done with the Respiratory Therapy Director and Staff therapists and discussed with nursing staff also.  Allyne Gee, MD [facility]  Pulmonary Critical Care Medicine Sleep Medicine

## 2019-06-10 DIAGNOSIS — J986 Disorders of diaphragm: Secondary | ICD-10-CM | POA: Diagnosis not present

## 2019-06-10 DIAGNOSIS — J9621 Acute and chronic respiratory failure with hypoxia: Secondary | ICD-10-CM | POA: Diagnosis not present

## 2019-06-10 DIAGNOSIS — I482 Chronic atrial fibrillation, unspecified: Secondary | ICD-10-CM | POA: Diagnosis not present

## 2019-06-10 DIAGNOSIS — I5032 Chronic diastolic (congestive) heart failure: Secondary | ICD-10-CM | POA: Diagnosis not present

## 2019-06-10 LAB — CULTURE, RESPIRATORY W GRAM STAIN

## 2019-06-10 NOTE — Progress Notes (Signed)
Pulmonary Critical Care Medicine [place]   PULMONARY CRITICAL CARE SERVICE  PROGRESS NOTE  Date of Service: 06/10/2019  REYES KASTER  [name]  DOB: 1934-10-10   DOA: 06/04/2019  Referring Physician: Merton Border, MD  HPI: Travis WINSTANLEY is a 85 y.o. male seen for follow up of Acute on Chronic Respiratory Failure.  Patient is on T collar at this time on 35% FiO2 good saturations are noted at the goal is for about 12 hours of weaning today  Medications: Reviewed on Rounds  Physical Exam:  Vitals: Temperature is 98.4 pulse 70 respiratory 21 blood pressure is 95/58 saturations 100%  Ventilator Settings on T collar with an FiO2 35%  . General: Comfortable at this time . Eyes: Grossly normal lids, irises & conjunctiva . ENT: grossly tongue is normal . Neck: no obvious mass . Cardiovascular: S1 S2 normal no gallop . Respiratory: No rhonchi no rales are noted at this time . Abdomen: soft . Skin: no rash seen on limited exam . Musculoskeletal: not rigid . Psychiatric:unable to assess . Neurologic: no seizure no involuntary movements         Lab Data:   Basic Metabolic Panel: Recent Labs  Lab 06/05/19 0612 06/07/19 0703 06/09/19 0659  NA 135 137 138  K 4.1 4.3 4.2  CL 96* 96* 99  CO2 29 32 30  GLUCOSE 99 130* 121*  BUN 20 27* 32*  CREATININE 0.67 0.65 0.76  CALCIUM 8.9 8.7* 8.5*  MG 1.8  --   --     ABG: Recent Labs  Lab 06/04/19 1932  PHART 7.460*  PCO2ART 41.9  PO2ART 102  HCO3 29.4*  O2SAT 97.7    Liver Function Tests: Recent Labs  Lab 06/05/19 0612  AST 40  ALT 36  ALKPHOS 158*  BILITOT 0.4  PROT 6.4*  ALBUMIN 2.4*   No results for input(s): LIPASE, AMYLASE in the last 168 hours. No results for input(s): AMMONIA in the last 168 hours.  CBC: Recent Labs  Lab 06/05/19 0612 06/07/19 0703 06/09/19 0659  WBC 6.1 5.7 5.8  HGB 8.7* 8.1* 7.4*  HCT 29.8* 27.2* 25.5*  MCV 91.1 91.0 90.7  PLT 303 252 200     Cardiac Enzymes: No results for input(s): CKTOTAL, CKMB, CKMBINDEX, TROPONINI in the last 168 hours.  BNP (last 3 results) No results for input(s): BNP in the last 8760 hours.  ProBNP (last 3 results) No results for input(s): PROBNP in the last 8760 hours.  Radiological Exams: No results found.  Assessment/Plan Active Problems:   Acute on chronic respiratory failure with hypoxia (HCC)   Diaphragmatic paralysis   History of DVT of lower extremity   Chronic congestive heart failure with left ventricular diastolic dysfunction (HCC)   Chronic atrial fibrillation ([place])   1. Acute on chronic respiratory failure hypoxia continue with T collar trials currently on 35% FiO2 titrate oxygen continue pulmonary toilet 2. Diaphragmatic paralysis no change we will continue with supportive care 3. History of DVT treated we will continue to follow 4. Chronic congestive heart failure with left ventricular diastolic dysfunction monitor fluid status 5. Chronic atrial fibrillation rate is controlled at this time   I have personally seen and evaluated the patient, evaluated laboratory and imaging results, formulated the assessment and plan and placed orders. The Patient requires high complexity decision making with multiple systems involvement.  Rounds were done with the Respiratory Therapy Director and Staff therapists and discussed with nursing staff also.  Hendricks Schwandt  Richardson Dopp, MD [facility] Pulmonary Critical Care Medicine Sleep Medicine

## 2019-06-11 DIAGNOSIS — J9621 Acute and chronic respiratory failure with hypoxia: Secondary | ICD-10-CM | POA: Diagnosis not present

## 2019-06-11 DIAGNOSIS — I482 Chronic atrial fibrillation, unspecified: Secondary | ICD-10-CM | POA: Diagnosis not present

## 2019-06-11 DIAGNOSIS — I5032 Chronic diastolic (congestive) heart failure: Secondary | ICD-10-CM | POA: Diagnosis not present

## 2019-06-11 DIAGNOSIS — J986 Disorders of diaphragm: Secondary | ICD-10-CM | POA: Diagnosis not present

## 2019-06-11 LAB — BLOOD GAS, ARTERIAL
Acid-Base Excess: 7.2 mmol/L — ABNORMAL HIGH (ref 0.0–2.0)
Bicarbonate: 31.6 mmol/L — ABNORMAL HIGH (ref 20.0–28.0)
FIO2: 40
O2 Saturation: 97.6 %
Patient temperature: 37
pCO2 arterial: 48.4 mmHg — ABNORMAL HIGH (ref 32.0–48.0)
pH, Arterial: 7.43 (ref 7.350–7.450)
pO2, Arterial: 112 mmHg — ABNORMAL HIGH (ref 83.0–108.0)

## 2019-06-11 NOTE — Progress Notes (Signed)
Pulmonary Critical Care Medicine [place]   PULMONARY CRITICAL CARE SERVICE  PROGRESS NOTE  Date of Service: 06/11/2019  Travis Brewer  [name]  DOB: June 27, 1934   DOA: 06/04/2019  Referring Physician: Merton Border, MD  HPI: Travis Brewer is a 85 y.o. male seen for follow up of Acute on Chronic Respiratory Failure.  On T collar right now on 40% FiO2 the goal today is for about 12 hours  Medications: Reviewed on Rounds  Physical Exam:  Vitals: Temperature is 98.6 pulse 92 respiratory rate 20 blood pressure is 119/79 saturations 96%  Ventilator Settings off the ventilator on T collar FiO2 40%  . General: Comfortable at this time . Eyes: Grossly normal lids, irises & conjunctiva . ENT: grossly tongue is normal . Neck: no obvious mass . Cardiovascular: S1 S2 normal no gallop . Respiratory: No rhonchi no rales are noted at this time . Abdomen: soft . Skin: no rash seen on limited exam . Musculoskeletal: not rigid . Psychiatric:unable to assess . Neurologic: no seizure no involuntary movements         Lab Data:   Basic Metabolic Panel: Recent Labs  Lab 06/05/19 0612 06/07/19 0703 06/09/19 0659  NA 135 137 138  K 4.1 4.3 4.2  CL 96* 96* 99  CO2 29 32 30  GLUCOSE 99 130* 121*  BUN 20 27* 32*  CREATININE 0.67 0.65 0.76  CALCIUM 8.9 8.7* 8.5*  MG 1.8  --   --     ABG: Recent Labs  Lab 06/04/19 1932  PHART 7.460*  PCO2ART 41.9  PO2ART 102  HCO3 29.4*  O2SAT 97.7    Liver Function Tests: Recent Labs  Lab 06/05/19 0612  AST 40  ALT 36  ALKPHOS 158*  BILITOT 0.4  PROT 6.4*  ALBUMIN 2.4*   No results for input(s): LIPASE, AMYLASE in the last 168 hours. No results for input(s): AMMONIA in the last 168 hours.  CBC: Recent Labs  Lab 06/05/19 0612 06/07/19 0703 06/09/19 0659  WBC 6.1 5.7 5.8  HGB 8.7* 8.1* 7.4*  HCT 29.8* 27.2* 25.5*  MCV 91.1 91.0 90.7  PLT 303 252 200    Cardiac Enzymes: No results for input(s):  CKTOTAL, CKMB, CKMBINDEX, TROPONINI in the last 168 hours.  BNP (last 3 results) No results for input(s): BNP in the last 8760 hours.  ProBNP (last 3 results) No results for input(s): PROBNP in the last 8760 hours.  Radiological Exams: No results found.  Assessment/Plan Active Problems:   Acute on chronic respiratory failure with hypoxia (HCC)   Diaphragmatic paralysis   History of DVT of lower extremity   Chronic congestive heart failure with left ventricular diastolic dysfunction (HCC)   Chronic atrial fibrillation ([place])   1. Acute on chronic respiratory failure with hypoxia we will continue with T collar trials titrate as tolerated continue pulmonary toilet 2. Diaphragmatic paralysis at baseline we will continue to follow along 3. DVT lower extremity treated 4. Chronic congestive heart failure follow fluid status 5. Chronic atrial fibrillation rate controlled   I have personally seen and evaluated the patient, evaluated laboratory and imaging results, formulated the assessment and plan and placed orders. The Patient requires high complexity decision making with multiple systems involvement.  Rounds were done with the Respiratory Therapy Director and Staff therapists and discussed with nursing staff also.  Allyne Gee, MD [facility] Pulmonary Critical Care Medicine Sleep Medicine

## 2019-06-12 DIAGNOSIS — J986 Disorders of diaphragm: Secondary | ICD-10-CM | POA: Diagnosis not present

## 2019-06-12 DIAGNOSIS — I5032 Chronic diastolic (congestive) heart failure: Secondary | ICD-10-CM | POA: Diagnosis not present

## 2019-06-12 DIAGNOSIS — I482 Chronic atrial fibrillation, unspecified: Secondary | ICD-10-CM | POA: Diagnosis not present

## 2019-06-12 DIAGNOSIS — J9621 Acute and chronic respiratory failure with hypoxia: Secondary | ICD-10-CM | POA: Diagnosis not present

## 2019-06-12 NOTE — Progress Notes (Signed)
Pulmonary Critical Care Medicine [place]   PULMONARY CRITICAL CARE SERVICE  PROGRESS NOTE  Date of Service: 06/12/2019  Travis Brewer  [id number]  DOB: 1935/01/16   DOA: 06/04/2019  Referring Physician: Merton Border, MD  HPI: Travis Brewer is a 85 y.o. male seen for follow up of Acute on Chronic Respiratory Failure.  Patient is on T collar now has been on 35% FiO2 the goal is for 16 hours  Medications: Reviewed on Rounds  Physical Exam:  Vitals: Temperature is 97.4 pulse 95 respiratory 20 blood pressure is 106/62 saturations 99%  Ventilator Settings on T collar currently on 35% FiO2  . General: Comfortable at this time . Eyes: Grossly normal lids, irises & conjunctiva . ENT: grossly tongue is normal . Neck: no obvious mass . Cardiovascular: S1 S2 normal no gallop . Respiratory: Coarse breath sounds few rhonchi . Abdomen: soft . Skin: no rash seen on limited exam . Musculoskeletal: not rigid . Psychiatric:unable to assess . Neurologic: no seizure no involuntary movements         Lab Data:   Basic Metabolic Panel: Recent Labs  Lab 06/07/19 0703 06/09/19 0659  NA 137 138  K 4.3 4.2  CL 96* 99  CO2 32 30  GLUCOSE 130* 121*  BUN 27* 32*  CREATININE 0.65 0.76  CALCIUM 8.7* 8.5*    ABG: Recent Labs  Lab 06/11/19 1350  PHART 7.430  PCO2ART 48.4*  PO2ART 112*  HCO3 31.6*  O2SAT 97.6    Liver Function Tests: No results for input(s): AST, ALT, ALKPHOS, BILITOT, PROT, ALBUMIN in the last 168 hours. No results for input(s): LIPASE, AMYLASE in the last 168 hours. No results for input(s): AMMONIA in the last 168 hours.  CBC: Recent Labs  Lab 06/07/19 0703 06/09/19 0659  WBC 5.7 5.8  HGB 8.1* 7.4*  HCT 27.2* 25.5*  MCV 91.0 90.7  PLT 252 200    Cardiac Enzymes: No results for input(s): CKTOTAL, CKMB, CKMBINDEX, TROPONINI in the last 168 hours.  BNP (last 3 results) No results for input(s): BNP in the last 8760  hours.  ProBNP (last 3 results) No results for input(s): PROBNP in the last 8760 hours.  Radiological Exams: No results found.  Assessment/Plan Active Problems:   Acute on chronic respiratory failure with hypoxia (HCC)   Diaphragmatic paralysis   History of DVT of lower extremity   Chronic congestive heart failure with left ventricular diastolic dysfunction (HCC)   Chronic atrial fibrillation ([place])   1. Acute on chronic respiratory failure hypoxia plan is to continue with T collar trials patient is on 35% FiO2 with a goal of 16 hours 2. Diaphragmatic paralysis we are monitoring closely 3. History of DVT treated 4. Chronic congestive heart failure compensated 5. Chronic atrial fibrillation rate controlled we will continue with supportive care   I have personally seen and evaluated the patient, evaluated laboratory and imaging results, formulated the assessment and plan and placed orders. The Patient requires high complexity decision making with multiple systems involvement.  Rounds were done with the Respiratory Therapy Director and Staff therapists and discussed with nursing staff also.  Allyne Gee, MD [facility] Pulmonary Critical Care Medicine Sleep Medicine

## 2019-06-13 DIAGNOSIS — I5032 Chronic diastolic (congestive) heart failure: Secondary | ICD-10-CM | POA: Diagnosis not present

## 2019-06-13 DIAGNOSIS — J9621 Acute and chronic respiratory failure with hypoxia: Secondary | ICD-10-CM | POA: Diagnosis not present

## 2019-06-13 DIAGNOSIS — I482 Chronic atrial fibrillation, unspecified: Secondary | ICD-10-CM | POA: Diagnosis not present

## 2019-06-13 DIAGNOSIS — J986 Disorders of diaphragm: Secondary | ICD-10-CM | POA: Diagnosis not present

## 2019-06-13 NOTE — Progress Notes (Addendum)
Pulmonary Critical Care Medicine [place]   PULMONARY CRITICAL CARE SERVICE  PROGRESS NOTE  Date of Service: 06/13/2019  JULION RIGOLI  [id number]  DOB: 09-07-1934   DOA: 06/04/2019  Referring Physician: Merton Border, MD  HPI: Travis Brewer is a 85 y.o. male seen for follow up of Acute on Chronic Respiratory Failure.  Patient is on T collar currently on 30% FiO2 good saturations are noted  Medications: Reviewed on Rounds  Physical Exam:  Vitals: Temperature is 96.6 pulse 96 respiratory 20 blood pressure is 113/62 saturations 100%  Ventilator Settings off the ventilator on T collar on 30% FiO2  . General: Comfortable at this time . Eyes: Grossly normal lids, irises & conjunctiva . ENT: grossly tongue is normal . Neck: no obvious mass . Cardiovascular: S1 S2 normal no gallop . Respiratory: Coarse rhonchi expansion is equal at this time . Abdomen: soft . Skin: no rash seen on limited exam . Musculoskeletal: not rigid . Psychiatric:unable to assess . Neurologic: no seizure no involuntary movements         Lab Data:   Basic Metabolic Panel: Recent Labs  Lab 06/07/19 0703 06/09/19 0659  NA 137 138  K 4.3 4.2  CL 96* 99  CO2 32 30  GLUCOSE 130* 121*  BUN 27* 32*  CREATININE 0.65 0.76  CALCIUM 8.7* 8.5*    ABG: Recent Labs  Lab 06/11/19 1350  PHART 7.430  PCO2ART 48.4*  PO2ART 112*  HCO3 31.6*  O2SAT 97.6    Liver Function Tests: No results for input(s): AST, ALT, ALKPHOS, BILITOT, PROT, ALBUMIN in the last 168 hours. No results for input(s): LIPASE, AMYLASE in the last 168 hours. No results for input(s): AMMONIA in the last 168 hours.  CBC: Recent Labs  Lab 06/07/19 0703 06/09/19 0659  WBC 5.7 5.8  HGB 8.1* 7.4*  HCT 27.2* 25.5*  MCV 91.0 90.7  PLT 252 200    Cardiac Enzymes: No results for input(s): CKTOTAL, CKMB, CKMBINDEX, TROPONINI in the last 168 hours.  BNP (last 3 results) No results for input(s): BNP in the  last 8760 hours.  ProBNP (last 3 results) No results for input(s): PROBNP in the last 8760 hours.  Radiological Exams: No results found.  Assessment/Plan Active Problems:   Acute on chronic respiratory failure with hypoxia (HCC)   Diaphragmatic paralysis   History of DVT of lower extremity   Chronic congestive heart failure with left ventricular diastolic dysfunction (HCC)   Chronic atrial fibrillation ([place])   1. Acute on chronic respiratory failure hypoxia patient is weaning on T collar will need nocturnal vent support.  Secondary to the diaphragmatic paralysis he will need some form of positive airway pressure 2. Diaphragmatic paralysis at baseline right now nocturnal vent support likely 3. History of DVT treated 4. Chronic congestive heart failure compensated 5. Chronic atrial fibrillation rate controlled   I have personally seen and evaluated the patient, evaluated laboratory and imaging results, formulated the assessment and plan and placed orders. The Patient requires high complexity decision making with multiple systems involvement.  Rounds were done with the Respiratory Therapy Director and Staff therapists and discussed with nursing staff also.  Allyne Gee, MD [facility] Pulmonary Critical Care Medicine Sleep Medicine

## 2019-06-14 DIAGNOSIS — J986 Disorders of diaphragm: Secondary | ICD-10-CM | POA: Diagnosis not present

## 2019-06-14 DIAGNOSIS — I482 Chronic atrial fibrillation, unspecified: Secondary | ICD-10-CM | POA: Diagnosis not present

## 2019-06-14 DIAGNOSIS — J9621 Acute and chronic respiratory failure with hypoxia: Secondary | ICD-10-CM | POA: Diagnosis not present

## 2019-06-14 DIAGNOSIS — I5032 Chronic diastolic (congestive) heart failure: Secondary | ICD-10-CM | POA: Diagnosis not present

## 2019-06-14 LAB — BLOOD GAS, ARTERIAL
Acid-Base Excess: 4.6 mmol/L — ABNORMAL HIGH (ref 0.0–2.0)
Bicarbonate: 28.8 mmol/L — ABNORMAL HIGH (ref 20.0–28.0)
FIO2: 30
O2 Saturation: 92 %
Patient temperature: 36.5
pCO2 arterial: 43.1 mmHg (ref 32.0–48.0)
pH, Arterial: 7.437 (ref 7.350–7.450)
pO2, Arterial: 70.2 mmHg — ABNORMAL LOW (ref 83.0–108.0)

## 2019-06-14 MED ORDER — GENERIC EXTERNAL MEDICATION
Status: DC
Start: ? — End: 2019-06-14

## 2019-06-14 NOTE — Progress Notes (Signed)
Pulmonary Critical Care Medicine [place]   PULMONARY CRITICAL CARE SERVICE  PROGRESS NOTE  Date of Service: 06/14/2019  Travis Brewer  [id number]  DOB: 1935-03-15   DOA: 06/04/2019  Referring Physician: Merton Border, MD  HPI: Travis Brewer is a 85 y.o. male seen for follow up of Acute on Chronic Respiratory Failure.  Patient at this time is on T collar has been on 30% FiO2 the goal today is for 48 hours  Medications: Reviewed on Rounds  Physical Exam:  Vitals: Temperature is 97.8 pulse 122 respiratory 20 blood pressure is 109/68 saturations 97%  Ventilator Settings on T collar within 30% FiO2  . General: Comfortable at this time . Eyes: Grossly normal lids, irises & conjunctiva . ENT: grossly tongue is normal . Neck: no obvious mass . Cardiovascular: S1 S2 normal no gallop . Respiratory: No rhonchi no rales are noted at this time . Abdomen: soft . Skin: no rash seen on limited exam . Musculoskeletal: not rigid . Psychiatric:unable to assess . Neurologic: no seizure no involuntary movements         Lab Data:   Basic Metabolic Panel: Recent Labs  Lab 06/09/19 0659  NA 138  K 4.2  CL 99  CO2 30  GLUCOSE 121*  BUN 32*  CREATININE 0.76  CALCIUM 8.5*    ABG: Recent Labs  Lab 06/11/19 1350 06/14/19 1208  PHART 7.430 7.437  PCO2ART 48.4* 43.1  PO2ART 112* 70.2*  HCO3 31.6* 28.8*  O2SAT 97.6 92.0    Liver Function Tests: No results for input(s): AST, ALT, ALKPHOS, BILITOT, PROT, ALBUMIN in the last 168 hours. No results for input(s): LIPASE, AMYLASE in the last 168 hours. No results for input(s): AMMONIA in the last 168 hours.  CBC: Recent Labs  Lab 06/09/19 0659  WBC 5.8  HGB 7.4*  HCT 25.5*  MCV 90.7  PLT 200    Cardiac Enzymes: No results for input(s): CKTOTAL, CKMB, CKMBINDEX, TROPONINI in the last 168 hours.  BNP (last 3 results) No results for input(s): BNP in the last 8760 hours.  ProBNP (last 3 results) No  results for input(s): PROBNP in the last 8760 hours.  Radiological Exams: No results found.  Assessment/Plan Active Problems:   Acute on chronic respiratory failure with hypoxia (HCC)   Diaphragmatic paralysis   History of DVT of lower extremity   Chronic congestive heart failure with left ventricular diastolic dysfunction (HCC)   Chronic atrial fibrillation ([place])   1. Acute on chronic respiratory failure hypoxia continue T collar trials titrate oxygen as tolerated today will be 48 hours completed 2. Diaphragmatic paralysis no change we will continue to monitor 3. History of DVT continue with supportive care 4. Chronic congestive heart failure with left ventricular diastolic dysfunction 5. Chronic atrial fibrillation rate is controlled at this time   I have personally seen and evaluated the patient, evaluated laboratory and imaging results, formulated the assessment and plan and placed orders. The Patient requires high complexity decision making with multiple systems involvement.  Rounds were done with the Respiratory Therapy Director and Staff therapists and discussed with nursing staff also.  Allyne Gee, MD [facility] Pulmonary Critical Care Medicine Sleep Medicine

## 2019-06-15 ENCOUNTER — Other Ambulatory Visit (HOSPITAL_COMMUNITY): Payer: Medicare Other

## 2019-06-15 DIAGNOSIS — I5032 Chronic diastolic (congestive) heart failure: Secondary | ICD-10-CM | POA: Diagnosis not present

## 2019-06-15 DIAGNOSIS — I482 Chronic atrial fibrillation, unspecified: Secondary | ICD-10-CM | POA: Diagnosis not present

## 2019-06-15 DIAGNOSIS — J986 Disorders of diaphragm: Secondary | ICD-10-CM | POA: Diagnosis not present

## 2019-06-15 DIAGNOSIS — J9621 Acute and chronic respiratory failure with hypoxia: Secondary | ICD-10-CM | POA: Diagnosis not present

## 2019-06-15 MED ORDER — GENERIC EXTERNAL MEDICATION
Status: DC
Start: ? — End: 2019-06-15

## 2019-06-15 NOTE — Progress Notes (Addendum)
Pulmonary Critical Care Medicine [place]   PULMONARY CRITICAL CARE SERVICE  PROGRESS NOTE  Date of Service: 06/15/2019  RICH YAEGER  [name]  DOB: November 06, 1934   DOA: 06/04/2019  Referring Physician: Merton Border, MD  HPI: SHAYE CHUSTZ is a 85 y.o. male seen for follow up of Acute on Chronic Respiratory Failure.  Patient respiratory: As tolerated.  Satting well for distress.  Medications: Reviewed on Rounds  Physical Exam:  Vitals: Pulse 69 respirations 19 BP 90/59 O2 sat 99%  Temp 99.2  Ventilator Settings ATC 40%  . General: Comfortable at this time . Eyes: Grossly normal lids, irises & conjunctiva . ENT: grossly tongue is normal . Neck: no obvious mass . Cardiovascular: S1 S2 normal no gallop . Respiratory: No rales or rhonchi noted . Abdomen: soft . Skin: no rash seen on limited exam . Musculoskeletal: not rigid . Psychiatric:unable to assess . Neurologic: no seizure no involuntary movements         Lab Data:   Basic Metabolic Panel: Recent Labs  Lab 06/09/19 0659  NA 138  K 4.2  CL 99  CO2 30  GLUCOSE 121*  BUN 32*  CREATININE 0.76  CALCIUM 8.5*    ABG: Recent Labs  Lab 06/11/19 1350 06/14/19 1208  PHART 7.430 7.437  PCO2ART 48.4* 43.1  PO2ART 112* 70.2*  HCO3 31.6* 28.8*  O2SAT 97.6 92.0    Liver Function Tests: No results for input(s): AST, ALT, ALKPHOS, BILITOT, PROT, ALBUMIN in the last 168 hours. No results for input(s): LIPASE, AMYLASE in the last 168 hours. No results for input(s): AMMONIA in the last 168 hours.  CBC: Recent Labs  Lab 06/09/19 0659  WBC 5.8  HGB 7.4*  HCT 25.5*  MCV 90.7  PLT 200    Cardiac Enzymes: No results for input(s): CKTOTAL, CKMB, CKMBINDEX, TROPONINI in the last 168 hours.  BNP (last 3 results) No results for input(s): BNP in the last 8760 hours.  ProBNP (last 3 results) No results for input(s): PROBNP in the last 8760 hours.  Radiological Exams: No results  found.  Assessment/Plan Active Problems:   Acute on chronic respiratory failure with hypoxia (HCC)   Diaphragmatic paralysis   History of DVT of lower extremity   Chronic congestive heart failure with left ventricular diastolic dysfunction (HCC)   Chronic atrial fibrillation ([place])   1. Acute on chronic respiratory failure hypoxia continue T collar trials at this time currently on 40% aerosol trach collar.  Patient goes on as tolerated.  Continue supportive measures and pulmonary toilet. 2. Diaphragmatic paralysis no change we will continue to monitor 3. History of DVT continue with supportive care 4. Chronic congestive heart failure with left ventricular diastolic dysfunction 5. Chronic atrial fibrillation rate is controlled at this time   I have personally seen and evaluated the patient, evaluated laboratory and imaging results, formulated the assessment and plan and placed orders. The Patient requires high complexity decision making with multiple systems involvement.  Rounds were done with the Respiratory Therapy Director and Staff therapists and discussed with nursing staff also.  Allyne Gee, MD [facility] Pulmonary Critical Care Medicine Sleep Medicine

## 2019-06-16 DIAGNOSIS — J9621 Acute and chronic respiratory failure with hypoxia: Secondary | ICD-10-CM | POA: Diagnosis not present

## 2019-06-16 DIAGNOSIS — I5032 Chronic diastolic (congestive) heart failure: Secondary | ICD-10-CM | POA: Diagnosis not present

## 2019-06-16 DIAGNOSIS — J986 Disorders of diaphragm: Secondary | ICD-10-CM | POA: Diagnosis not present

## 2019-06-16 DIAGNOSIS — I482 Chronic atrial fibrillation, unspecified: Secondary | ICD-10-CM | POA: Diagnosis not present

## 2019-06-16 NOTE — Progress Notes (Addendum)
Pulmonary Critical Care Medicine [place]   PULMONARY CRITICAL CARE SERVICE  PROGRESS NOTE  Date of Service: 06/16/2019  Travis Brewer  [name]  DOB: [birth year]   DOA: 06/04/2019  Referring Physician: Merton Border, MD  HPI: Travis Brewer is a 85 y.o. male seen for follow up of Acute on Chronic Respiratory Failure.  Patient remains on 35% aerosol trach collar using PMV with no difficulty.  Medications: Reviewed on Rounds  Physical Exam:  Vitals: Pulse 103 respirations 20 BP 101/47 O2 sat 100% temp 98.6  Ventilator Settings 35% ATC  . General: Comfortable at this time . Eyes: Grossly normal lids, irises & conjunctiva . ENT: grossly tongue is normal . Neck: no obvious mass . Cardiovascular: S1 S2 normal no gallop . Respiratory: No rales or rhonchi noted . Abdomen: soft . Skin: no rash seen on limited exam . Musculoskeletal: not rigid . Psychiatric:unable to assess . Neurologic: no seizure no involuntary movements         Lab Data:   Basic Metabolic Panel: No results for input(s): NA, K, CL, CO2, GLUCOSE, BUN, CREATININE, CALCIUM, MG, PHOS in the last 168 hours.  ABG: Recent Labs  Lab 06/11/19 1350 06/14/19 1208  PHART 7.430 7.437  PCO2ART 48.4* 43.1  PO2ART 112* 70.2*  HCO3 31.6* 28.8*  O2SAT 97.6 92.0    Liver Function Tests: No results for input(s): AST, ALT, ALKPHOS, BILITOT, PROT, ALBUMIN in the last 168 hours. No results for input(s): LIPASE, AMYLASE in the last 168 hours. No results for input(s): AMMONIA in the last 168 hours.  CBC: No results for input(s): WBC, NEUTROABS, HGB, HCT, MCV, PLT in the last 168 hours.  Cardiac Enzymes: No results for input(s): CKTOTAL, CKMB, CKMBINDEX, TROPONINI in the last 168 hours.  BNP (last 3 results) No results for input(s): BNP in the last 8760 hours.  ProBNP (last 3 results) No results for input(s): PROBNP in the last 8760 hours.  Radiological Exams: DG CHEST PORT 1  VIEW  Result Date: 06/15/2019 CLINICAL DATA:  Respiratory failure. EXAM: PORTABLE CHEST 1 VIEW COMPARISON:  Radiographs 06/06/2019 and 06/05/2019. FINDINGS: 1339 hours. Tracheostomy appears well positioned. 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 There are persistent low lung volumes with bibasilar atelectasis and probable small bilateral pleural effusions. No edema, confluent airspace opacity or pneumothorax. Glenohumeral degenerative changes are present bilaterally. IMPRESSION: Stable chest with persistent right-greater-than-left pleural effusions and bibasilar atelectasis. Electronically Signed   By: Richardean Sale M.D.   On: 06/15/2019 14:11    Assessment/Plan Active Problems:   Acute on chronic respiratory failure with hypoxia (HCC)   Diaphragmatic paralysis   History of DVT of lower extremity   Chronic congestive heart failure with left ventricular diastolic dysfunction (HCC)   Chronic atrial fibrillation ([place])   1. Acute on chronic respiratory failure hypoxia continue T collar trials at this time currently on 35% aerosol trach collar.  Patient goes on as tolerated.  Continue supportive measures and pulmonary toilet. 2. Diaphragmatic paralysis no change we will continue to monitor 3. History of DVT continue with supportive care 4. Chronic congestive heart failure with left ventricular diastolic dysfunction 5. Chronic atrial fibrillation rate is controlled at this time   I have personally seen and evaluated the patient, evaluated laboratory and imaging results, formulated the assessment and plan and placed orders. The Patient requires high complexity decision making with multiple systems involvement.  Rounds were done with the Respiratory Therapy Director and  Staff therapists and discussed with nursing staff also.  Allyne Gee, MD [facility] Pulmonary Critical Care Medicine Sleep Medicine

## 2019-06-17 DIAGNOSIS — J986 Disorders of diaphragm: Secondary | ICD-10-CM | POA: Diagnosis not present

## 2019-06-17 DIAGNOSIS — I5032 Chronic diastolic (congestive) heart failure: Secondary | ICD-10-CM | POA: Diagnosis not present

## 2019-06-17 DIAGNOSIS — I482 Chronic atrial fibrillation, unspecified: Secondary | ICD-10-CM | POA: Diagnosis not present

## 2019-06-17 DIAGNOSIS — J9621 Acute and chronic respiratory failure with hypoxia: Secondary | ICD-10-CM | POA: Diagnosis not present

## 2019-06-17 NOTE — Progress Notes (Addendum)
Pulmonary Critical Care Medicine [place]   PULMONARY CRITICAL CARE SERVICE  PROGRESS NOTE  Date of Service: 06/17/2019  Travis Brewer  [name]  DOB: 05/07/1934   DOA: 06/04/2019  Referring Physician: Merton Border, MD  HPI: Travis Brewer is a 85 y.o. male seen for follow up of Acute on Chronic Respiratory Failure.  Patient continues on 35% aerosol trach collar satting well no fever or distress.  Medications: Reviewed on Rounds  Physical Exam:  Vitals: Pulse 103 respirations 18 BP 103/65 O2 sat 99% temp 97.6  Ventilator Settings ATC 35%  . General: Comfortable at this time . Eyes: Grossly normal lids, irises & conjunctiva . ENT: grossly tongue is normal . Neck: no obvious mass . Cardiovascular: S1 S2 normal no gallop . Respiratory: No rales or rhonchi noted . Abdomen: soft . Skin: no rash seen on limited exam . Musculoskeletal: not rigid . Psychiatric:unable to assess . Neurologic: no seizure no involuntary movements         Lab Data:   Basic Metabolic Panel: No results for input(s): NA, K, CL, CO2, GLUCOSE, BUN, CREATININE, CALCIUM, MG, PHOS in the last 168 hours.  ABG: Recent Labs  Lab 06/11/19 1350 06/14/19 1208  PHART 7.430 7.437  PCO2ART 48.4* 43.1  PO2ART 112* 70.2*  HCO3 31.6* 28.8*  O2SAT 97.6 92.0    Liver Function Tests: No results for input(s): AST, ALT, ALKPHOS, BILITOT, PROT, ALBUMIN in the last 168 hours. No results for input(s): LIPASE, AMYLASE in the last 168 hours. No results for input(s): AMMONIA in the last 168 hours.  CBC: No results for input(s): WBC, NEUTROABS, HGB, HCT, MCV, PLT in the last 168 hours.  Cardiac Enzymes: No results for input(s): CKTOTAL, CKMB, CKMBINDEX, TROPONINI in the last 168 hours.  BNP (last 3 results) No results for input(s): BNP in the last 8760 hours.  ProBNP (last 3 results) No results for input(s): PROBNP in the last 8760 hours.  Radiological Exams: No results  found.  Assessment/Plan Active Problems:   Acute on chronic respiratory failure with hypoxia (HCC)   Diaphragmatic paralysis   History of DVT of lower extremity   Chronic congestive heart failure with left ventricular diastolic dysfunction (HCC)   Chronic atrial fibrillation ([place])   1. Acute on chronic respiratory failure hypoxiacontinue T collar trials at this time currently on 35% aerosol trach collar. Patient goes on as tolerated. Continue supportive measures and pulmonary toilet. 2. Diaphragmatic paralysis no change we will continue to monitor 3. History of DVT continue with supportive care 4. Chronic congestive heart failure with left ventricular diastolic dysfunction 5. Chronic atrial fibrillation rate is controlled at this time   I have personally seen and evaluated the patient, evaluated laboratory and imaging results, formulated the assessment and plan and placed orders. The Patient requires high complexity decision making with multiple systems involvement.  Rounds were done with the Respiratory Therapy Director and Staff therapists and discussed with nursing staff also.  Allyne Gee, MD [facility] Pulmonary Critical Care Medicine Sleep Medicine

## 2019-06-18 DIAGNOSIS — J9621 Acute and chronic respiratory failure with hypoxia: Secondary | ICD-10-CM | POA: Diagnosis not present

## 2019-06-18 DIAGNOSIS — I5032 Chronic diastolic (congestive) heart failure: Secondary | ICD-10-CM | POA: Diagnosis not present

## 2019-06-18 DIAGNOSIS — I482 Chronic atrial fibrillation, unspecified: Secondary | ICD-10-CM | POA: Diagnosis not present

## 2019-06-18 DIAGNOSIS — J986 Disorders of diaphragm: Secondary | ICD-10-CM | POA: Diagnosis not present

## 2019-06-18 NOTE — Progress Notes (Signed)
Pulmonary Critical Care Medicine [place]   PULMONARY CRITICAL CARE SERVICE  PROGRESS NOTE  Date of Service: 06/18/2019  Travis Brewer  [id number]  DOB: 1934-10-17   DOA: 06/04/2019  Referring Physician: Merton Border, MD  HPI: Travis Brewer is a 85 y.o. male seen for follow up of Acute on Chronic Respiratory Failure.  Patient currently is on T collar 28% FiO2 good saturations are noted.  Patient is also tolerating the PMV fairly well  Medications: Reviewed on Rounds  Physical Exam:  Vitals: Temperature is 97.6 pulse 107 respiratory 20 blood pressure is 100/55 saturations 98%  Ventilator Settings on T collar FiO2 28% with PMV  . General: Comfortable at this time . Eyes: Grossly normal lids, irises & conjunctiva . ENT: grossly tongue is normal . Neck: no obvious mass . Cardiovascular: S1 S2 normal no gallop . Respiratory: No rhonchi coarse breath sounds . Abdomen: soft . Skin: no rash seen on limited exam . Musculoskeletal: not rigid . Psychiatric:unable to assess . Neurologic: no seizure no involuntary movements         Lab Data:   Basic Metabolic Panel: No results for input(s): NA, K, CL, CO2, GLUCOSE, BUN, CREATININE, CALCIUM, MG, PHOS in the last 168 hours.  ABG: Recent Labs  Lab 06/14/19 1208  PHART 7.437  PCO2ART 43.1  PO2ART 70.2*  HCO3 28.8*  O2SAT 92.0    Liver Function Tests: No results for input(s): AST, ALT, ALKPHOS, BILITOT, PROT, ALBUMIN in the last 168 hours. No results for input(s): LIPASE, AMYLASE in the last 168 hours. No results for input(s): AMMONIA in the last 168 hours.  CBC: No results for input(s): WBC, NEUTROABS, HGB, HCT, MCV, PLT in the last 168 hours.  Cardiac Enzymes: No results for input(s): CKTOTAL, CKMB, CKMBINDEX, TROPONINI in the last 168 hours.  BNP (last 3 results) No results for input(s): BNP in the last 8760 hours.  ProBNP (last 3 results) No results for input(s): PROBNP in the last 8760  hours.  Radiological Exams: No results found.  Assessment/Plan Active Problems:   Acute on chronic respiratory failure with hypoxia (HCC)   Diaphragmatic paralysis   History of DVT of lower extremity   Chronic congestive heart failure with left ventricular diastolic dysfunction (HCC)   Chronic atrial fibrillation ([place])   1. Acute on chronic respiratory failure hypoxia continue with T collar weaning as tolerated continue secretion management pulmonary toilet 2. Diaphragmatic paralysis supportive care we will see if patient is able to keep off the ventilator or need nocturnal support 3. DVT lower extremity treated we will follow 4. Chronic congestive heart failure compensated 5. Chronic atrial fibrillation rate is controlled we will continue to monitor   I have personally seen and evaluated the patient, evaluated laboratory and imaging results, formulated the assessment and plan and placed orders. The Patient requires high complexity decision making with multiple systems involvement.  Rounds were done with the Respiratory Therapy Director and Staff therapists and discussed with nursing staff also.  Allyne Gee, MD [facility] Pulmonary Critical Care Medicine Sleep Medicine

## 2019-06-19 ENCOUNTER — Other Ambulatory Visit (HOSPITAL_COMMUNITY): Payer: Medicare Other

## 2019-06-19 DIAGNOSIS — I482 Chronic atrial fibrillation, unspecified: Secondary | ICD-10-CM | POA: Diagnosis not present

## 2019-06-19 DIAGNOSIS — J9621 Acute and chronic respiratory failure with hypoxia: Secondary | ICD-10-CM | POA: Diagnosis not present

## 2019-06-19 DIAGNOSIS — J986 Disorders of diaphragm: Secondary | ICD-10-CM | POA: Diagnosis not present

## 2019-06-19 DIAGNOSIS — I5032 Chronic diastolic (congestive) heart failure: Secondary | ICD-10-CM | POA: Diagnosis not present

## 2019-06-19 LAB — CBC
HCT: 28.5 % — ABNORMAL LOW (ref 39.0–52.0)
Hemoglobin: 8.1 g/dL — ABNORMAL LOW (ref 13.0–17.0)
MCH: 25.2 pg — ABNORMAL LOW (ref 26.0–34.0)
MCHC: 28.4 g/dL — ABNORMAL LOW (ref 30.0–36.0)
MCV: 88.8 fL (ref 80.0–100.0)
Platelets: 305 10*3/uL (ref 150–400)
RBC: 3.21 MIL/uL — ABNORMAL LOW (ref 4.22–5.81)
RDW: 19.4 % — ABNORMAL HIGH (ref 11.5–15.5)
WBC: 6.9 10*3/uL (ref 4.0–10.5)
nRBC: 0 % (ref 0.0–0.2)

## 2019-06-19 LAB — BASIC METABOLIC PANEL
Anion gap: 9 (ref 5–15)
BUN: 18 mg/dL (ref 8–23)
CO2: 31 mmol/L (ref 22–32)
Calcium: 9 mg/dL (ref 8.9–10.3)
Chloride: 104 mmol/L (ref 98–111)
Creatinine, Ser: 0.66 mg/dL (ref 0.61–1.24)
GFR calc Af Amer: >60 mL/min (ref >60)
GFR calc non Af Amer: >60 mL/min (ref >60)
Glucose, Bld: 102 mg/dL — ABNORMAL HIGH (ref 70–99)
Potassium: 3.4 mmol/L — ABNORMAL LOW (ref 3.5–5.1)
Sodium: 144 mmol/L (ref 135–145)

## 2019-06-19 LAB — URINALYSIS, ROUTINE W REFLEX MICROSCOPIC
Bilirubin Urine: NEGATIVE
Glucose, UA: NEGATIVE mg/dL
Ketones, ur: NEGATIVE mg/dL
Leukocytes,Ua: NEGATIVE
Nitrite: NEGATIVE
Protein, ur: 30 mg/dL — AB
RBC / HPF: >50 RBC/hpf — ABNORMAL HIGH (ref 0–5)
Specific Gravity, Urine: 1.015 (ref 1.005–1.030)
pH: 7 (ref 5.0–8.0)

## 2019-06-19 LAB — MAGNESIUM: Magnesium: 1.7 mg/dL (ref 1.7–2.4)

## 2019-06-19 NOTE — Consult Note (Signed)
Referring Physician: Dr. Delman Kitten is an 85 y.o. male.                       Chief Complaint: Atrial fibrillation  HPI: 85 years old white male with PMH of 5.3 cm ascending aortic aneurysm repaired, CAD, CABG, CHF, atrial fibrillation, syncope, anemia, pneumonia, delirium, CO2 embolism from vein harvest. 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 He has weaning attempted with fair success. He has atrial fibrillation with RVR and has responded partially to increasing dose of metoprolol. Echocardiogram done 2 months ago showed RV and LV systolic dysfunction. EKG today atrial fibrillation with moderate ventricular rate control with non-specific ST-T changes..  Past Medical History:  Diagnosis Date  . Acute on chronic respiratory failure with hypoxia ([place])   . Chronic atrial fibrillation ([place])   . Chronic congestive heart failure with left ventricular diastolic dysfunction ([place])   . Diaphragmatic paralysis   . History of DVT of lower extremity      PMH: As above.  PSH: Ascending aortic aneurysm repair-02/22/2019 Back surgery-2010 CAD/Cardiac cath: 02/15/2019, CABG11/03/2019 Heel spur surgery Left wrist ganglion cyst excision Total knee replacement left knee 2012 and right knee 2020  The histories are not reviewed yet. Please review them in the "History" navigator section and refresh this [place].  Family history: Stroke to father and diabetes to mother.  Social History:  has no history for tobacco use, Positive alcohol use and no drug use.  Allergies: Not on File  No medications prior to admission.    Results for orders placed or performed during the hospital encounter of 06/04/19 (from the past 48 hour(s))  CBC     Status: Abnormal   Collection Time: 06/19/19 12:28 PM  Result Value Ref Range   WBC 6.9 4.0 - 10.5 K/uL   RBC 3.21 (L) 4.22 - 5.81 MIL/uL   Hemoglobin 8.1 (L) 13.0 - 17.0 g/dL   HCT 28.5 (L)  39.0 - 52.0 %   MCV 88.8 80.0 - 100.0 fL   MCH 25.2 (L) 26.0 - 34.0 pg   MCHC 28.4 (L) 30.0 - 36.0 g/dL   RDW 19.4 (H) 11.5 - 15.5 %   Platelets 305 150 - 400 K/uL   nRBC 0.0 0.0 - 0.2 %    Comment: Performed at [facility], [address], [place], [place] [address]  Magnesium     Status: None   Collection Time: 06/19/19 12:28 PM  Result Value Ref Range   Magnesium 1.7 1.7 - 2.4 mg/dL    Comment: Performed at [facility], [place] [address], [place], [place] [id number]  Basic metabolic panel     Status: Abnormal   Collection Time: 06/19/19 12:28 PM  Result Value Ref Range   Sodium 144 135 - 145 mmol/L   Potassium 3.4 (L) 3.5 - 5.1 mmol/L   Chloride 104 98 - 111 mmol/L   CO2 31 22 - 32 mmol/L   Glucose, Bld 102 (H) 70 - 99 mg/dL    Comment: Glucose reference range applies only to samples taken after fasting for at least 8 hours.   BUN 18 8 - 23 mg/dL   Creatinine, Ser 0.66 0.61 - 1.24 mg/dL   Calcium 9.0 8.9 - 10.3 mg/dL   GFR calc non Af Amer >60 >60 mL/min   GFR calc Af Amer >60 >60 mL/min   Anion gap 9 5 -  15    Comment: Performed at [facility], [place] [address] [place], [place] [address]  Urinalysis, Routine w reflex microscopic     Status: Abnormal   Collection Time: 06/19/19  4:00 PM  Result Value Ref Range   Color, Urine YELLOW YELLOW   APPearance HAZY (A) CLEAR   Specific Gravity, Urine 1.015 1.005 - 1.030   pH 7.0 5.0 - 8.0   Glucose, UA NEGATIVE NEGATIVE mg/dL   Hgb urine dipstick LARGE (A) NEGATIVE   Bilirubin Urine NEGATIVE NEGATIVE   Ketones, ur NEGATIVE NEGATIVE mg/dL   Protein, ur 30 (A) NEGATIVE mg/dL   Nitrite NEGATIVE NEGATIVE   Leukocytes,Ua NEGATIVE NEGATIVE   RBC / HPF >50 (H) 0 - 5 RBC/hpf   WBC, UA 0-5 0 - 5 WBC/hpf   Bacteria, UA RARE (A) NONE SEEN    Comment: Performed at [facility] [address], [place], [place] [address]   No results found.  Review Of Systems Constitutional: No fever, chills, Positive weight  loss. Eyes: No vision change, wears glasses. No discharge or pain. Ears: No hearing loss, No tinnitus. Respiratory: No asthma, COPD, Positive pneumonias positive shortness of breath. No hemoptysis. Cardiovascular: No chest pain, palpitation, leg edema. Gastrointestinal: No nausea, vomiting, diarrhea, constipation. No GI bleed. No hepatitis. Genitourinary: No dysuria, hematuria, kidney stone. No incontinance. Neurological: No headache, stroke, seizures.  Psychiatry: No psych facility admission for anxiety, depression, suicide. No detox. Skin: No rash. Musculoskeletal: Positive joint pain, no fibromyalgia. No neck pain, back pain. S/P knee surgeries. Lymphadenopathy: No lymphadenopathy. Hematology: Positive anemia or easy bruising.  T: 97.6 degree F, P: 107, R: 20, BP: 100/55 and oxygen saturation 98 %. T collar FiO2 28 %. There were no vitals taken for this visit. There is no height or weight on file to calculate BMI. General appearance: alert, cooperative, appears stated age and mild respiratory distress at rest Head: Normocephalic, atraumatic. Eyes: Blue eyes, pink conjunctiva, corneas clear.  Neck: No adenopathy, no carotid bruit, no JVD, supple, symmetrical, trachea midline and thyroid not enlarged. Resp: Clearing to auscultation bilaterally. Cardio: Irregular rate and rhythm with tachycardia, S1, S2 normal, II/VI systolic murmur, no click, rub or gallop GI: Soft, non-tender; bowel sounds normal; no organomegaly. Extremities: No edema, cyanosis or clubbing. Skin: Warm and dry.  Neurologic: Alert and oriented X 2, chronic left sided weakness.  Assessment/Plan Acute on chronic respiratory failure with hypoxia Chronic LV systolic failure Chronic RV systolic failure Diaphragmatic paralysis with atelectasis of lung H/O DVT of lower extremity Chronic atrial fibrillation with moderate ventricular rate control, [id number] score of 5 CAD CABG S/P Ascending aneurysm repair Anemia of  blood loss and chronic disease S/P stroke Hyperlipidemia Hypokalemia  Plan: Continue metoprolol for heart rate control. Add Lanoxin for RV failure and rate control. Potassium supplement, already ordered.  Time spent: Review of old records, Lab, x-rays, EKG, other cardiac tests, examination, discussion with patient and referring doctor over 70 minutes.  Birdie Riddle, MD  06/19/2019, 5:39 PM

## 2019-06-19 NOTE — Progress Notes (Signed)
Pulmonary Critical Care Medicine [place]   PULMONARY CRITICAL CARE SERVICE  PROGRESS NOTE  Date of Service: 06/19/2019  Travis Brewer  [id number]  DOB: 04/01/1935   DOA: 06/04/2019  Referring Physician: Merton Border, MD  HPI: Travis Brewer is a 85 y.o. male seen for follow up of Acute on Chronic Respiratory Failure.  Patient is right now on T collar has been on 28% FiO2 using PMV.  Believe that he is ready for capping  Medications: Reviewed on Rounds  Physical Exam:  Vitals: Temperature is 96.9 pulse 100 respiratory rate 20 blood pressure is 126/66 saturations 100%  Ventilator Settings on T collar with an FiO2 of 28%  . General: Comfortable at this time . Eyes: Grossly normal lids, irises & conjunctiva . ENT: grossly tongue is normal . Neck: no obvious mass . Cardiovascular: S1 S2 normal no gallop . Respiratory: No rhonchi coarse breath sounds are noted at this time . Abdomen: soft . Skin: no rash seen on limited exam . Musculoskeletal: not rigid . Psychiatric:unable to assess . Neurologic: no seizure no involuntary movements         Lab Data:   Basic Metabolic Panel: Recent Labs  Lab 06/19/19 1228  NA 144  K 3.4*  CL 104  CO2 31  GLUCOSE 102*  BUN 18  CREATININE 0.66  CALCIUM 9.0  MG 1.7    ABG: Recent Labs  Lab 06/14/19 1208  PHART 7.437  PCO2ART 43.1  PO2ART 70.2*  HCO3 28.8*  O2SAT 92.0    Liver Function Tests: No results for input(s): AST, ALT, ALKPHOS, BILITOT, PROT, ALBUMIN in the last 168 hours. No results for input(s): LIPASE, AMYLASE in the last 168 hours. No results for input(s): AMMONIA in the last 168 hours.  CBC: Recent Labs  Lab 06/19/19 1228  WBC 6.9  HGB 8.1*  HCT 28.5*  MCV 88.8  PLT 305    Cardiac Enzymes: No results for input(s): CKTOTAL, CKMB, CKMBINDEX, TROPONINI in the last 168 hours.  BNP (last 3 results) No results for input(s): BNP in the last 8760 hours.  ProBNP (last 3  results) No results for input(s): PROBNP in the last 8760 hours.  Radiological Exams: No results found.  Assessment/Plan Active Problems:   Acute on chronic respiratory failure with hypoxia (HCC)   Diaphragmatic paralysis   History of DVT of lower extremity   Chronic congestive heart failure with left ventricular diastolic dysfunction (HCC)   Chronic atrial fibrillation ([place])   1. Acute on chronic respiratory failure with hypoxia continue with advancing the weaning ready for capping trials at this point 2. Diaphragmatic paralysis no change we will continue with present management 3. DVT treated 4. Chronic congestive heart failure with diastolic dysfunction compensated at this time we will continue to monitor 5. Chronic atrial fibrillation rate is controlled   I have personally seen and evaluated the patient, evaluated laboratory and imaging results, formulated the assessment and plan and placed orders. The Patient requires high complexity decision making with multiple systems involvement.  Rounds were done with the Respiratory Therapy Director and Staff therapists and discussed with nursing staff also.  Allyne Gee, MD [facility] Pulmonary Critical Care Medicine Sleep Medicine

## 2019-06-20 DIAGNOSIS — J9621 Acute and chronic respiratory failure with hypoxia: Secondary | ICD-10-CM | POA: Diagnosis not present

## 2019-06-20 DIAGNOSIS — I482 Chronic atrial fibrillation, unspecified: Secondary | ICD-10-CM | POA: Diagnosis not present

## 2019-06-20 DIAGNOSIS — I5032 Chronic diastolic (congestive) heart failure: Secondary | ICD-10-CM | POA: Diagnosis not present

## 2019-06-20 DIAGNOSIS — J986 Disorders of diaphragm: Secondary | ICD-10-CM | POA: Diagnosis not present

## 2019-06-20 LAB — URINE CULTURE: Culture: NO GROWTH

## 2019-06-20 NOTE — Progress Notes (Addendum)
Pulmonary Critical Care Medicine [place]   PULMONARY CRITICAL CARE SERVICE  PROGRESS NOTE  Date of Service: 06/20/2019  KAYVAN LASKEY  [id number]  DOB: 09-13-1934   DOA: 06/04/2019  Referring Physician: Merton Border, MD  HPI: Travis Brewer is a 85 y.o. male seen for follow up of Acute on Chronic Respiratory Failure.  Patient mains on 20% aerosol trach collar using PMV with no difficulty.  Medications: Reviewed on Rounds  Physical Exam:  Vitals: Pulse 101 respirations 21 BP 101/57 O2 sat 96% temp 97.2  Ventilator Settings 28% aerosol trach collar  . General: Comfortable at this time . Eyes: Grossly normal lids, irises & conjunctiva . ENT: grossly tongue is normal . Neck: no obvious mass . Cardiovascular: S1 S2 normal no gallop . Respiratory: No rales or rhonchi noted . Abdomen: soft . Skin: no rash seen on limited exam . Musculoskeletal: not rigid . Psychiatric:unable to assess . Neurologic: no seizure no involuntary movements         Lab Data:   Basic Metabolic Panel: Recent Labs  Lab 06/19/19 1228  NA 144  K 3.4*  CL 104  CO2 31  GLUCOSE 102*  BUN 18  CREATININE 0.66  CALCIUM 9.0  MG 1.7    ABG: Recent Labs  Lab 06/14/19 1208  PHART 7.437  PCO2ART 43.1  PO2ART 70.2*  HCO3 28.8*  O2SAT 92.0    Liver Function Tests: No results for input(s): AST, ALT, ALKPHOS, BILITOT, PROT, ALBUMIN in the last 168 hours. No results for input(s): LIPASE, AMYLASE in the last 168 hours. No results for input(s): AMMONIA in the last 168 hours.  CBC: Recent Labs  Lab 06/19/19 1228  WBC 6.9  HGB 8.1*  HCT 28.5*  MCV 88.8  PLT 305    Cardiac Enzymes: No results for input(s): CKTOTAL, CKMB, CKMBINDEX, TROPONINI in the last 168 hours.  BNP (last 3 results) No results for input(s): BNP in the last 8760 hours.  ProBNP (last 3 results) No results for input(s): PROBNP in the last 8760 hours.  Radiological Exams: No results  found.  Assessment/Plan Active Problems:   Acute on chronic respiratory failure with hypoxia (HCC)   Diaphragmatic paralysis   History of DVT of lower extremity   Chronic congestive heart failure with left ventricular diastolic dysfunction (HCC)   Chronic atrial fibrillation ([place])   1. Acute on chronic respiratory failure with hypoxia continue with PMV use at this time may begin capping trials.  Continue supportive measures and pulmonary toilet. 2. Diaphragmatic paralysis no change we will continue with present management 3. DVT treated 4. Chronic congestive heart failure with diastolic dysfunction compensated at this time we will continue to monitor 5. Chronic atrial fibrillation rate is controlled   I have personally seen and evaluated the patient, evaluated laboratory and imaging results, formulated the assessment and plan and placed orders. The Patient requires high complexity decision making with multiple systems involvement.  Rounds were done with the Respiratory Therapy Director and Staff therapists and discussed with nursing staff also.  Allyne Gee, MD [facility] Pulmonary Critical Care Medicine Sleep Medicine

## 2019-06-21 DIAGNOSIS — I482 Chronic atrial fibrillation, unspecified: Secondary | ICD-10-CM | POA: Diagnosis not present

## 2019-06-21 DIAGNOSIS — J9621 Acute and chronic respiratory failure with hypoxia: Secondary | ICD-10-CM | POA: Diagnosis not present

## 2019-06-21 DIAGNOSIS — J986 Disorders of diaphragm: Secondary | ICD-10-CM | POA: Diagnosis not present

## 2019-06-21 DIAGNOSIS — I5032 Chronic diastolic (congestive) heart failure: Secondary | ICD-10-CM | POA: Diagnosis not present

## 2019-06-21 NOTE — Progress Notes (Signed)
Pulmonary Critical Care Medicine [place]   PULMONARY CRITICAL CARE SERVICE  PROGRESS NOTE  Date of Service: 06/21/2019  Travis Brewer  [name]  DOB: December 11, 1934   DOA: 06/04/2019  Referring Physician: Merton Border, MD  HPI: Travis Brewer is a 85 y.o. male seen for follow up of Acute on Chronic Respiratory Failure.  At this time patient is on T collar with 28% FiO2 also is tolerating the PMV fairly well  Medications: Reviewed on Rounds  Physical Exam:  Vitals: Temperature is 97.8 pulse 67 respiratory 28 blood pressure is 112/62 saturations 99%  Ventilator Settings on T collar with an FiO2 28%  . General: Comfortable at this time . Eyes: Grossly normal lids, irises & conjunctiva . ENT: grossly tongue is normal . Neck: no obvious mass . Cardiovascular: S1 S2 normal no gallop . Respiratory: No rhonchi no rales are noted at this time . Abdomen: soft . Skin: no rash seen on limited exam . Musculoskeletal: not rigid . Psychiatric:unable to assess . Neurologic: no seizure no involuntary movements         Lab Data:   Basic Metabolic Panel: Recent Labs  Lab 06/19/19 1228  NA 144  K 3.4*  CL 104  CO2 31  GLUCOSE 102*  BUN 18  CREATININE 0.66  CALCIUM 9.0  MG 1.7    ABG: Recent Labs  Lab 06/14/19 1208  PHART 7.437  PCO2ART 43.1  PO2ART 70.2*  HCO3 28.8*  O2SAT 92.0    Liver Function Tests: No results for input(s): AST, ALT, ALKPHOS, BILITOT, PROT, ALBUMIN in the last 168 hours. No results for input(s): LIPASE, AMYLASE in the last 168 hours. No results for input(s): AMMONIA in the last 168 hours.  CBC: Recent Labs  Lab 06/19/19 1228  WBC 6.9  HGB 8.1*  HCT 28.5*  MCV 88.8  PLT 305    Cardiac Enzymes: No results for input(s): CKTOTAL, CKMB, CKMBINDEX, TROPONINI in the last 168 hours.  BNP (last 3 results) No results for input(s): BNP in the last 8760 hours.  ProBNP (last 3 results) No results for input(s): PROBNP in  the last 8760 hours.  Radiological Exams: No results found.  Assessment/Plan Active Problems:   Acute on chronic respiratory failure with hypoxia (HCC)   Diaphragmatic paralysis   History of DVT of lower extremity   Chronic congestive heart failure with left ventricular diastolic dysfunction (HCC)   Chronic atrial fibrillation ([place])   1. Chronic respiratory failure with hypoxia we will continue with the T collar trials again.  Hopefully will be able to advance to capping 2. Diaphragmatic paralysis he is actually doing quite well and has been liberated from the ventilator 3. History of DVT at baseline we will continue with the management 4. Chronic congestive heart failure monitoring fluid status 5. Chronic atrial fibrillation rate controlled   I have personally seen and evaluated the patient, evaluated laboratory and imaging results, formulated the assessment and plan and placed orders. The Patient requires high complexity decision making with multiple systems involvement.  Rounds were done with the Respiratory Therapy Director and Staff therapists and discussed with nursing staff also.  Allyne Gee, MD [facility] Pulmonary Critical Care Medicine Sleep Medicine

## 2019-06-22 DIAGNOSIS — I482 Chronic atrial fibrillation, unspecified: Secondary | ICD-10-CM | POA: Diagnosis not present

## 2019-06-22 DIAGNOSIS — J986 Disorders of diaphragm: Secondary | ICD-10-CM | POA: Diagnosis not present

## 2019-06-22 DIAGNOSIS — J9621 Acute and chronic respiratory failure with hypoxia: Secondary | ICD-10-CM | POA: Diagnosis not present

## 2019-06-22 DIAGNOSIS — I5032 Chronic diastolic (congestive) heart failure: Secondary | ICD-10-CM | POA: Diagnosis not present

## 2019-06-22 LAB — POTASSIUM: Potassium: 3.5 mmol/L (ref 3.5–5.1)

## 2019-06-22 NOTE — Progress Notes (Addendum)
Pulmonary Critical Care Medicine [place]   PULMONARY CRITICAL CARE SERVICE  PROGRESS NOTE  Date of Service: 06/22/2019  Travis Brewer  [name]  DOB: 09-30-1934   DOA: 06/04/2019  Referring Physician: Merton Border, MD  HPI: Travis Brewer is a 85 y.o. male seen for follow up of Acute on Chronic Respiratory Failure.  Patient mains on 20% aerosol trach collar satting well time using PMV capping intermittently.  Medications: Reviewed on Rounds  Physical Exam:  Vitals: Pulse 96 respirations 22 BP 151/98 O2 sat 95% temp 96.9  Ventilator Settings ATC 28%  . General: Comfortable at this time . Eyes: Grossly normal lids, irises & conjunctiva . ENT: grossly tongue is normal . Neck: no obvious mass . Cardiovascular: S1 S2 normal no gallop . Respiratory: No rales or rhonchi noted . Abdomen: soft . Skin: no rash seen on limited exam . Musculoskeletal: not rigid . Psychiatric:unable to assess . Neurologic: no seizure no involuntary movements         Lab Data:   Basic Metabolic Panel: Recent Labs  Lab 06/19/19 1228  NA 144  K 3.4*  CL 104  CO2 31  GLUCOSE 102*  BUN 18  CREATININE 0.66  CALCIUM 9.0  MG 1.7    ABG: No results for input(s): PHART, PCO2ART, PO2ART, HCO3, O2SAT in the last 168 hours.  Liver Function Tests: No results for input(s): AST, ALT, ALKPHOS, BILITOT, PROT, ALBUMIN in the last 168 hours. No results for input(s): LIPASE, AMYLASE in the last 168 hours. No results for input(s): AMMONIA in the last 168 hours.  CBC: Recent Labs  Lab 06/19/19 1228  WBC 6.9  HGB 8.1*  HCT 28.5*  MCV 88.8  PLT 305    Cardiac Enzymes: No results for input(s): CKTOTAL, CKMB, CKMBINDEX, TROPONINI in the last 168 hours.  BNP (last 3 results) No results for input(s): BNP in the last 8760 hours.  ProBNP (last 3 results) No results for input(s): PROBNP in the last 8760 hours.  Radiological Exams: No results  found.  Assessment/Plan Active Problems:   Acute on chronic respiratory failure with hypoxia (HCC)   Diaphragmatic paralysis   History of DVT of lower extremity   Chronic congestive heart failure with left ventricular diastolic dysfunction (HCC)   Chronic atrial fibrillation ([place])   1. Chronic respiratory failure with hypoxia continue T collar at this time as well using PMV.  Continue to advance to capping as patient can tolerate. 2. Diaphragmatic paralysis he is actually doing quite well and has been liberated from the ventilator 3. History of DVT at baseline we will continue with the management 4. Chronic congestive heart failure monitoring fluid status 5. Chronic atrial fibrillation rate controlled   I have personally seen and evaluated the patient, evaluated laboratory and imaging results, formulated the assessment and plan and placed orders. The Patient requires high complexity decision making with multiple systems involvement.  Rounds were done with the Respiratory Therapy Director and Staff therapists and discussed with nursing staff also.  Allyne Gee, MD [facility] Pulmonary Critical Care Medicine Sleep Medicine

## 2019-06-23 DIAGNOSIS — I482 Chronic atrial fibrillation, unspecified: Secondary | ICD-10-CM | POA: Diagnosis not present

## 2019-06-23 DIAGNOSIS — I5032 Chronic diastolic (congestive) heart failure: Secondary | ICD-10-CM | POA: Diagnosis not present

## 2019-06-23 DIAGNOSIS — J986 Disorders of diaphragm: Secondary | ICD-10-CM | POA: Diagnosis not present

## 2019-06-23 DIAGNOSIS — J9621 Acute and chronic respiratory failure with hypoxia: Secondary | ICD-10-CM | POA: Diagnosis not present

## 2019-06-23 LAB — URINALYSIS, ROUTINE W REFLEX MICROSCOPIC
Bacteria, UA: NONE SEEN
Bilirubin Urine: NEGATIVE
Glucose, UA: NEGATIVE mg/dL
Ketones, ur: NEGATIVE mg/dL
Nitrite: NEGATIVE
Protein, ur: 30 mg/dL — AB
RBC / HPF: >50 RBC/hpf — ABNORMAL HIGH (ref 0–5)
Specific Gravity, Urine: 1.012 (ref 1.005–1.030)
pH: 7 (ref 5.0–8.0)

## 2019-06-23 NOTE — Progress Notes (Signed)
Pulmonary Critical Care Medicine [place]   PULMONARY CRITICAL CARE SERVICE  PROGRESS NOTE  Date of Service: 06/23/2019  Travis Brewer  [id number]  DOB: 1934-12-08   DOA: 06/04/2019  Referring Physician: Merton Border, MD  HPI: Travis Brewer is a 85 y.o. male seen for follow up of Acute on Chronic Respiratory Failure.  Patient at this time is on T collar weaning appears to be comfortable right now without any distress  Medications: Reviewed on Rounds  Physical Exam:  Vitals: Temperature is 97.4 pulse 71 respiratory rate 22 blood pressure is 97/51 saturations 99%  Ventilator Settings off the ventilator on T collar  . General: Comfortable at this time . Eyes: Grossly normal lids, irises & conjunctiva . ENT: grossly tongue is normal . Neck: no obvious mass . Cardiovascular: S1 S2 normal no gallop . Respiratory: No rhonchi coarse breath sounds . Abdomen: soft . Skin: no rash seen on limited exam . Musculoskeletal: not rigid . Psychiatric:unable to assess . Neurologic: no seizure no involuntary movements         Lab Data:   Basic Metabolic Panel: Recent Labs  Lab 06/19/19 1228 06/22/19 1207  NA 144  --   K 3.4* 3.5  CL 104  --   CO2 31  --   GLUCOSE 102*  --   BUN 18  --   CREATININE 0.66  --   CALCIUM 9.0  --   MG 1.7  --     ABG: No results for input(s): PHART, PCO2ART, PO2ART, HCO3, O2SAT in the last 168 hours.  Liver Function Tests: No results for input(s): AST, ALT, ALKPHOS, BILITOT, PROT, ALBUMIN in the last 168 hours. No results for input(s): LIPASE, AMYLASE in the last 168 hours. No results for input(s): AMMONIA in the last 168 hours.  CBC: Recent Labs  Lab 06/19/19 1228  WBC 6.9  HGB 8.1*  HCT 28.5*  MCV 88.8  PLT 305    Cardiac Enzymes: No results for input(s): CKTOTAL, CKMB, CKMBINDEX, TROPONINI in the last 168 hours.  BNP (last 3 results) No results for input(s): BNP in the last 8760 hours.  ProBNP (last 3  results) No results for input(s): PROBNP in the last 8760 hours.  Radiological Exams: No results found.  Assessment/Plan Active Problems:   Acute on chronic respiratory failure with hypoxia (HCC)   Diaphragmatic paralysis   History of DVT of lower extremity   Chronic congestive heart failure with left ventricular diastolic dysfunction (HCC)   Chronic atrial fibrillation ([place])   1. Acute on chronic respiratory failure with hypoxia continue with T collar wean and also with PMV. 2. Diaphragmatic paralysis patient is at baseline we will continue to monitor closely. 3. History of DVT treated 4. Chronic congestive heart failure compensated 5. Chronic atrial fibrillation right now rate controlled   I have personally seen and evaluated the patient, evaluated laboratory and imaging results, formulated the assessment and plan and placed orders. The Patient requires high complexity decision making with multiple systems involvement.  Rounds were done with the Respiratory Therapy Director and Staff therapists and discussed with nursing staff also.  Allyne Gee, MD [facility] Pulmonary Critical Care Medicine Sleep Medicine

## 2019-06-24 ENCOUNTER — Other Ambulatory Visit (HOSPITAL_COMMUNITY): Payer: Medicare Other

## 2019-06-24 DIAGNOSIS — J986 Disorders of diaphragm: Secondary | ICD-10-CM | POA: Diagnosis not present

## 2019-06-24 DIAGNOSIS — I5032 Chronic diastolic (congestive) heart failure: Secondary | ICD-10-CM | POA: Diagnosis not present

## 2019-06-24 DIAGNOSIS — J9621 Acute and chronic respiratory failure with hypoxia: Secondary | ICD-10-CM | POA: Diagnosis not present

## 2019-06-24 DIAGNOSIS — I482 Chronic atrial fibrillation, unspecified: Secondary | ICD-10-CM | POA: Diagnosis not present

## 2019-06-24 LAB — URINE CULTURE: Culture: NO GROWTH

## 2019-06-24 NOTE — Progress Notes (Signed)
Pulmonary Critical Care Medicine [place]   PULMONARY CRITICAL CARE SERVICE  PROGRESS NOTE  Date of Service: 06/24/2019  Travis Brewer  [id number]  DOB: 04-07-1935   DOA: 06/04/2019  Referring Physician: Merton Border, MD  HPI: Travis Brewer is a 85 y.o. male seen for follow up of Acute on Chronic Respiratory Failure.  Patient currently is on T collar has been on 28% FiO2 using PMV  Medications: Reviewed on Rounds  Physical Exam:  Vitals: Temperature is 96.3 pulse 80 respiratory 28 blood pressure is 137/77 saturations 97%  Ventilator Settings on T collar with an FiO2 of 28%  . General: Comfortable at this time . Eyes: Grossly normal lids, irises & conjunctiva . ENT: grossly tongue is normal . Neck: no obvious mass . Cardiovascular: S1 S2 normal no gallop . Respiratory: Scattered rhonchi expansion is equal . Abdomen: soft . Skin: no rash seen on limited exam . Musculoskeletal: not rigid . Psychiatric:unable to assess . Neurologic: no seizure no involuntary movements         Lab Data:   Basic Metabolic Panel: Recent Labs  Lab 06/19/19 1228 06/22/19 1207  NA 144  --   K 3.4* 3.5  CL 104  --   CO2 31  --   GLUCOSE 102*  --   BUN 18  --   CREATININE 0.66  --   CALCIUM 9.0  --   MG 1.7  --     ABG: No results for input(s): PHART, PCO2ART, PO2ART, HCO3, O2SAT in the last 168 hours.  Liver Function Tests: No results for input(s): AST, ALT, ALKPHOS, BILITOT, PROT, ALBUMIN in the last 168 hours. No results for input(s): LIPASE, AMYLASE in the last 168 hours. No results for input(s): AMMONIA in the last 168 hours.  CBC: Recent Labs  Lab 06/19/19 1228  WBC 6.9  HGB 8.1*  HCT 28.5*  MCV 88.8  PLT 305    Cardiac Enzymes: No results for input(s): CKTOTAL, CKMB, CKMBINDEX, TROPONINI in the last 168 hours.  BNP (last 3 results) No results for input(s): BNP in the last 8760 hours.  ProBNP (last 3 results) No results for input(s):  PROBNP in the last 8760 hours.  Radiological Exams: DG Shoulder Right Port  Result Date: 06/24/2019 CLINICAL DATA:  Pain with no known injury.  Agitation. EXAM: PORTABLE RIGHT SHOULDER COMPARISON:  None. FINDINGS: Mild spurring at the glenohumeral and acromioclavicular joints. There is no fracture or erosion. No acute soft tissue finding or dislocation. IMPRESSION: Unremarkable for age. Electronically Signed   By: Monte Fantasia M.D.   On: 06/24/2019 10:05    Assessment/Plan Active Problems:   Acute on chronic respiratory failure with hypoxia (HCC)   Diaphragmatic paralysis   History of DVT of lower extremity   Chronic congestive heart failure with left ventricular diastolic dysfunction (HCC)   Chronic atrial fibrillation ([place])   1. Acute on chronic respiratory failure with hypoxia continue with T collar trials titrate oxygen continue pulmonary toilet. 2. Diaphragmatic paralysis no change supportive care 3. History of DVT treated 4. Chronic congestive heart failure we will continue with present management 5. Chronic atrial fibrillation rate is controlled we will continue to follow   I have personally seen and evaluated the patient, evaluated laboratory and imaging results, formulated the assessment and plan and placed orders. The Patient requires high complexity decision making with multiple systems involvement.  Rounds were done with the Respiratory Therapy Director and Staff therapists and discussed with nursing staff  also.  Allyne Gee, MD [facility] Pulmonary Critical Care Medicine Sleep Medicine

## 2019-06-25 DIAGNOSIS — I482 Chronic atrial fibrillation, unspecified: Secondary | ICD-10-CM | POA: Diagnosis not present

## 2019-06-25 DIAGNOSIS — J9621 Acute and chronic respiratory failure with hypoxia: Secondary | ICD-10-CM | POA: Diagnosis not present

## 2019-06-25 DIAGNOSIS — I5032 Chronic diastolic (congestive) heart failure: Secondary | ICD-10-CM | POA: Diagnosis not present

## 2019-06-25 DIAGNOSIS — J986 Disorders of diaphragm: Secondary | ICD-10-CM | POA: Diagnosis not present

## 2019-06-25 LAB — BASIC METABOLIC PANEL WITH GFR
Anion gap: 9 (ref 5–15)
BUN: 19 mg/dL (ref 8–23)
CO2: 29 mmol/L (ref 22–32)
Calcium: 9.1 mg/dL (ref 8.9–10.3)
Chloride: 104 mmol/L (ref 98–111)
Creatinine, Ser: 0.96 mg/dL (ref 0.61–1.24)
GFR calc Af Amer: >60 mL/min
GFR calc non Af Amer: >60 mL/min
Glucose, Bld: 108 mg/dL — ABNORMAL HIGH (ref 70–99)
Potassium: 4 mmol/L (ref 3.5–5.1)
Sodium: 142 mmol/L (ref 135–145)

## 2019-06-25 LAB — CBC
HCT: 29 % — ABNORMAL LOW (ref 39.0–52.0)
Hemoglobin: 8.2 g/dL — ABNORMAL LOW (ref 13.0–17.0)
MCH: 25.2 pg — ABNORMAL LOW (ref 26.0–34.0)
MCHC: 28.3 g/dL — ABNORMAL LOW (ref 30.0–36.0)
MCV: 89 fL (ref 80.0–100.0)
Platelets: 267 10*3/uL (ref 150–400)
RBC: 3.26 MIL/uL — ABNORMAL LOW (ref 4.22–5.81)
RDW: 21.9 % — ABNORMAL HIGH (ref 11.5–15.5)
WBC: 5.7 10*3/uL (ref 4.0–10.5)
nRBC: 0 % (ref 0.0–0.2)

## 2019-06-25 LAB — MAGNESIUM: Magnesium: 1.9 mg/dL (ref 1.7–2.4)

## 2019-06-25 NOTE — Progress Notes (Signed)
Pulmonary Critical Care Medicine [place]   PULMONARY CRITICAL CARE SERVICE  PROGRESS NOTE  Date of Service: 06/25/2019  Travis Brewer  [name]  DOB: Nov 16, 1934   DOA: 06/04/2019  Referring Physician: Merton Border, MD  HPI: Travis Brewer is a 85 y.o. male seen for follow up of Acute on Chronic Respiratory Failure.  Patient at this time is on T collar has been on 28% FiO2 right now without any significant change  Medications: Reviewed on Rounds  Physical Exam:  Vitals: Temperature is 96.1 pulse 71 respiratory 18 blood pressure is 113/71 saturations 96%  Ventilator Settings on T collar with an FiO2 of 28%  . General: Comfortable at this time . Eyes: Grossly normal lids, irises & conjunctiva . ENT: grossly tongue is normal . Neck: no obvious mass . Cardiovascular: S1 S2 normal no gallop . Respiratory: No rhonchi no rales are noted at this time . Abdomen: soft . Skin: no rash seen on limited exam . Musculoskeletal: not rigid . Psychiatric:unable to assess . Neurologic: no seizure no involuntary movements         Lab Data:   Basic Metabolic Panel: Recent Labs  Lab 06/19/19 1228 06/22/19 1207 06/25/19 0425  NA 144  --  142  K 3.4* 3.5 4.0  CL 104  --  104  CO2 31  --  29  GLUCOSE 102*  --  108*  BUN 18  --  19  CREATININE 0.66  --  0.96  CALCIUM 9.0  --  9.1  MG 1.7  --  1.9    ABG: No results for input(s): PHART, PCO2ART, PO2ART, HCO3, O2SAT in the last 168 hours.  Liver Function Tests: No results for input(s): AST, ALT, ALKPHOS, BILITOT, PROT, ALBUMIN in the last 168 hours. No results for input(s): LIPASE, AMYLASE in the last 168 hours. No results for input(s): AMMONIA in the last 168 hours.  CBC: Recent Labs  Lab 06/19/19 1228 06/25/19 0425  WBC 6.9 5.7  HGB 8.1* 8.2*  HCT 28.5* 29.0*  MCV 88.8 89.0  PLT 305 267    Cardiac Enzymes: No results for input(s): CKTOTAL, CKMB, CKMBINDEX, TROPONINI in the last 168  hours.  BNP (last 3 results) No results for input(s): BNP in the last 8760 hours.  ProBNP (last 3 results) No results for input(s): PROBNP in the last 8760 hours.  Radiological Exams: DG Wrist 2 Views Left  Result Date: 06/24/2019 CLINICAL DATA:  Wrist pain. EXAM: LEFT WRIST - 2 VIEW COMPARISON:  None. FINDINGS: No acute fracture. No evidence of dislocation. There is ulna positive variance. There is mild dorsal tilt of the lunate without evidence of lunate or perilunate dislocation. Advanced osteoarthritis at the thumb carpal metacarpal joint. Multifocal cystic changes and lucencies throughout the carpal bones. Chondrocalcinosis. There are vascular calcifications. Generalized soft tissue edema. IMPRESSION: 1. 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 2. Multifocal cystic changes in the since ease throughout the carpal bones, typically degenerative but nonspecific. Generalized soft tissue edema. Recommend clinical correlation for infection. 3. Ulnar positive variance. 4. Chondrocalcinosis. Electronically Signed   By: Keith Rake M.D.   On: 06/24/2019 18:42   DG Shoulder Right Port  Result Date: 06/24/2019 CLINICAL DATA:  Pain with no known injury.  Agitation. EXAM: PORTABLE RIGHT SHOULDER COMPARISON:  None. FINDINGS: Mild spurring at the glenohumeral and acromioclavicular joints. There is no fracture or erosion. No acute soft tissue finding or dislocation. IMPRESSION: Unremarkable for age. Electronically Signed   By:  Monte Fantasia M.D.   On: 06/24/2019 10:05    Assessment/Plan Active Problems:   Acute on chronic respiratory failure with hypoxia (HCC)   Diaphragmatic paralysis   History of DVT of lower extremity   Chronic congestive heart failure with left ventricular diastolic dysfunction (HCC)   Chronic atrial fibrillation ([place])   1. Acute on chronic respiratory failure with hypoxia continue T collar trials patient secretions are fairly moderate 2. Diaphragmatic  paralysis no change at this time with supportive care 3. DVT treated 4. Chronic congestive heart failure compensated 5. Chronic atrial fibrillation rate controlled   I have personally seen and evaluated the patient, evaluated laboratory and imaging results, formulated the assessment and plan and placed orders. The Patient requires high complexity decision making with multiple systems involvement.  Rounds were done with the Respiratory Therapy Director and Staff therapists and discussed with nursing staff also.  Allyne Gee, MD [facility] Pulmonary Critical Care Medicine Sleep Medicine

## 2019-06-26 DIAGNOSIS — J9621 Acute and chronic respiratory failure with hypoxia: Secondary | ICD-10-CM | POA: Diagnosis not present

## 2019-06-26 DIAGNOSIS — I5032 Chronic diastolic (congestive) heart failure: Secondary | ICD-10-CM | POA: Diagnosis not present

## 2019-06-26 DIAGNOSIS — J986 Disorders of diaphragm: Secondary | ICD-10-CM | POA: Diagnosis not present

## 2019-06-26 DIAGNOSIS — I482 Chronic atrial fibrillation, unspecified: Secondary | ICD-10-CM | POA: Diagnosis not present

## 2019-06-26 NOTE — Progress Notes (Signed)
Pulmonary Critical Care Medicine [place]   PULMONARY CRITICAL CARE SERVICE  PROGRESS NOTE  Date of Service: 06/26/2019  Travis Brewer  [name]  DOB: 24-Jul-1934   DOA: 06/04/2019  Referring Physician: Merton Border, MD  HPI: Travis Brewer is a 85 y.o. male seen for follow up of Acute on Chronic Respiratory Failure.  Patient currently is on T collar using the PMV also has been on 28% FiO2  Medications: Reviewed on Rounds  Physical Exam:  Vitals: Temperature is 97.6 pulse 75 respiratory rate 35 blood pressure is 104/58 saturations 95%  Ventilator Settings on T collar with an FiO2 of 28%  . General: Comfortable at this time . Eyes: Grossly normal lids, irises & conjunctiva . ENT: grossly tongue is normal . Neck: no obvious mass . Cardiovascular: S1 S2 normal no gallop . Respiratory: No rhonchi coarse breath sounds . Abdomen: soft . Skin: no rash seen on limited exam . Musculoskeletal: not rigid . Psychiatric:unable to assess . Neurologic: no seizure no involuntary movements         Lab Data:   Basic Metabolic Panel: Recent Labs  Lab 06/22/19 1207 06/25/19 0425  NA  --  142  K 3.5 4.0  CL  --  104  CO2  --  29  GLUCOSE  --  108*  BUN  --  19  CREATININE  --  0.96  CALCIUM  --  9.1  MG  --  1.9    ABG: No results for input(s): PHART, PCO2ART, PO2ART, HCO3, O2SAT in the last 168 hours.  Liver Function Tests: No results for input(s): AST, ALT, ALKPHOS, BILITOT, PROT, ALBUMIN in the last 168 hours. No results for input(s): LIPASE, AMYLASE in the last 168 hours. No results for input(s): AMMONIA in the last 168 hours.  CBC: Recent Labs  Lab 06/25/19 0425  WBC 5.7  HGB 8.2*  HCT 29.0*  MCV 89.0  PLT 267    Cardiac Enzymes: No results for input(s): CKTOTAL, CKMB, CKMBINDEX, TROPONINI in the last 168 hours.  BNP (last 3 results) No results for input(s): BNP in the last 8760 hours.  ProBNP (last 3 results) No results for  input(s): PROBNP in the last 8760 hours.  Radiological Exams: DG Wrist 2 Views Left  Result Date: 06/24/2019 CLINICAL DATA:  Wrist pain. EXAM: LEFT WRIST - 2 VIEW COMPARISON:  None. FINDINGS: No acute fracture. No evidence of dislocation. There is ulna positive variance. There is mild dorsal tilt of the lunate without evidence of lunate or perilunate dislocation. Advanced osteoarthritis at the thumb carpal metacarpal joint. Multifocal cystic changes and lucencies throughout the carpal bones. Chondrocalcinosis. There are vascular calcifications. Generalized soft tissue edema. IMPRESSION: 1. 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 2. Multifocal cystic changes in the since ease throughout the carpal bones, typically degenerative but nonspecific. Generalized soft tissue edema. Recommend clinical correlation for infection. 3. Ulnar positive variance. 4. Chondrocalcinosis. Electronically Signed   By: Keith Rake M.D.   On: 06/24/2019 18:42    Assessment/Plan Active Problems:   Acute on chronic respiratory failure with hypoxia (HCC)   Diaphragmatic paralysis   History of DVT of lower extremity   Chronic congestive heart failure with left ventricular diastolic dysfunction (HCC)   Chronic atrial fibrillation ([place])   1. Acute on chronic respiratory failure with hypoxia plan is to continue with PMV trials as ordered. 2. Diaphragmatic paralysis at baseline liberated from the ventilator 3. History of DVT treated 4. Chronic congestive heart  failure compensated monitor fluid status 5. Chronic atrial fibrillation rate controlled   I have personally seen and evaluated the patient, evaluated laboratory and imaging results, formulated the assessment and plan and placed orders. The Patient requires high complexity decision making with multiple systems involvement.  Rounds were done with the Respiratory Therapy Director and Staff therapists and discussed with nursing staff  also.  Allyne Gee, MD [facility] Pulmonary Critical Care Medicine Sleep Medicine

## 2019-06-27 DIAGNOSIS — J9621 Acute and chronic respiratory failure with hypoxia: Secondary | ICD-10-CM | POA: Diagnosis not present

## 2019-06-27 DIAGNOSIS — J986 Disorders of diaphragm: Secondary | ICD-10-CM | POA: Diagnosis not present

## 2019-06-27 DIAGNOSIS — I482 Chronic atrial fibrillation, unspecified: Secondary | ICD-10-CM | POA: Diagnosis not present

## 2019-06-27 DIAGNOSIS — I5032 Chronic diastolic (congestive) heart failure: Secondary | ICD-10-CM | POA: Diagnosis not present

## 2019-06-27 NOTE — Progress Notes (Signed)
Pulmonary Critical Care Medicine [place]   PULMONARY CRITICAL CARE SERVICE  PROGRESS NOTE  Date of Service: 06/27/2019  Travis Brewer  [id number]  DOB: Mar 13, 1935   DOA: 06/04/2019  Referring Physician: Merton Border, MD  HPI: Travis Brewer is a 85 y.o. male seen for follow up of Acute on Chronic Respiratory Failure.  Patient currently is on T collar comfortable without distress at this time is been on 28% FiO2  Medications: Reviewed on Rounds  Physical Exam:  Vitals: Temperature is 98.1 pulse 86 respiratory 28 blood pressure is 97/69 saturations 98%  Ventilator Settings off the ventilator on T collar  . General: Comfortable at this time . Eyes: Grossly normal lids, irises & conjunctiva . ENT: grossly tongue is normal . Neck: no obvious mass . Cardiovascular: S1 S2 normal no gallop . Respiratory: No rhonchi no rales are noted . Abdomen: soft . Skin: no rash seen on limited exam . Musculoskeletal: not rigid . Psychiatric:unable to assess . Neurologic: no seizure no involuntary movements         Lab Data:   Basic Metabolic Panel: Recent Labs  Lab 06/22/19 1207 06/25/19 0425  NA  --  142  K 3.5 4.0  CL  --  104  CO2  --  29  GLUCOSE  --  108*  BUN  --  19  CREATININE  --  0.96  CALCIUM  --  9.1  MG  --  1.9    ABG: No results for input(s): PHART, PCO2ART, PO2ART, HCO3, O2SAT in the last 168 hours.  Liver Function Tests: No results for input(s): AST, ALT, ALKPHOS, BILITOT, PROT, ALBUMIN in the last 168 hours. No results for input(s): LIPASE, AMYLASE in the last 168 hours. No results for input(s): AMMONIA in the last 168 hours.  CBC: Recent Labs  Lab 06/25/19 0425  WBC 5.7  HGB 8.2*  HCT 29.0*  MCV 89.0  PLT 267    Cardiac Enzymes: No results for input(s): CKTOTAL, CKMB, CKMBINDEX, TROPONINI in the last 168 hours.  BNP (last 3 results) No results for input(s): BNP in the last 8760 hours.  ProBNP (last 3 results) No  results for input(s): PROBNP in the last 8760 hours.  Radiological Exams: No results found.  Assessment/Plan Active Problems:   Acute on chronic respiratory failure with hypoxia (HCC)   Diaphragmatic paralysis   History of DVT of lower extremity   Chronic congestive heart failure with left ventricular diastolic dysfunction (HCC)   Chronic atrial fibrillation ([place])   1. Acute on chronic respiratory failure hypoxia continue T collar trials titrate oxygen continue pulmonary toilet. 2. Diaphragmatic paralysis he is at baseline will remain with the tracheostomy in place however has been liberated from the ventilator 3. History of DVT treated 4. Chronic congestive heart failure compensated 5. Chronic atrial fibrillation rate is controlled   I have personally seen and evaluated the patient, evaluated laboratory and imaging results, formulated the assessment and plan and placed orders. The Patient requires high complexity decision making with multiple systems involvement.  Rounds were done with the Respiratory Therapy Director and Staff therapists and discussed with nursing staff also.  Allyne Gee, MD [facility] Pulmonary Critical Care Medicine Sleep Medicine

## 2019-06-28 DIAGNOSIS — I482 Chronic atrial fibrillation, unspecified: Secondary | ICD-10-CM | POA: Diagnosis not present

## 2019-06-28 DIAGNOSIS — J986 Disorders of diaphragm: Secondary | ICD-10-CM | POA: Diagnosis not present

## 2019-06-28 DIAGNOSIS — I5032 Chronic diastolic (congestive) heart failure: Secondary | ICD-10-CM | POA: Diagnosis not present

## 2019-06-28 DIAGNOSIS — J9621 Acute and chronic respiratory failure with hypoxia: Secondary | ICD-10-CM | POA: Diagnosis not present

## 2019-06-28 MED ORDER — GENERIC EXTERNAL MEDICATION
Status: DC
Start: ? — End: 2019-06-28

## 2019-06-28 NOTE — Progress Notes (Addendum)
Pulmonary Critical Care Medicine [place]   PULMONARY CRITICAL CARE SERVICE  PROGRESS NOTE  Date of Service: 06/28/2019  Travis Brewer  [id number]  DOB: 1934-05-01   DOA: 06/04/2019  Referring Physician: Merton Border, MD  HPI: Travis Brewer is a 85 y.o. male seen for follow up of Acute on Chronic Respiratory Failure.  Patient continues on 20% aerosol trach collar using PMV with no difficulty.  Medications: Reviewed on Rounds  Physical Exam:  Vitals: Pulse 98 respirations 22 BP 123/67 O2 sat 96% temp 97.2  Ventilator Settings ATC 28%  . General: Comfortable at this time . Eyes: Grossly normal lids, irises & conjunctiva . ENT: grossly tongue is normal . Neck: no obvious mass . Cardiovascular: S1 S2 normal no gallop . Respiratory: No rales or rhonchi noted . Abdomen: soft . Skin: no rash seen on limited exam . Musculoskeletal: not rigid . Psychiatric:unable to assess . Neurologic: no seizure no involuntary movements         Lab Data:   Basic Metabolic Panel: Recent Labs  Lab 06/22/19 1207 06/25/19 0425  NA  --  142  K 3.5 4.0  CL  --  104  CO2  --  29  GLUCOSE  --  108*  BUN  --  19  CREATININE  --  0.96  CALCIUM  --  9.1  MG  --  1.9    ABG: No results for input(s): PHART, PCO2ART, PO2ART, HCO3, O2SAT in the last 168 hours.  Liver Function Tests: No results for input(s): AST, ALT, ALKPHOS, BILITOT, PROT, ALBUMIN in the last 168 hours. No results for input(s): LIPASE, AMYLASE in the last 168 hours. No results for input(s): AMMONIA in the last 168 hours.  CBC: Recent Labs  Lab 06/25/19 0425  WBC 5.7  HGB 8.2*  HCT 29.0*  MCV 89.0  PLT 267    Cardiac Enzymes: No results for input(s): CKTOTAL, CKMB, CKMBINDEX, TROPONINI in the last 168 hours.  BNP (last 3 results) No results for input(s): BNP in the last 8760 hours.  ProBNP (last 3 results) No results for input(s): PROBNP in the last 8760 hours.  Radiological  Exams: No results found.  Assessment/Plan Active Problems:   Acute on chronic respiratory failure with hypoxia (HCC)   Diaphragmatic paralysis   History of DVT of lower extremity   Chronic congestive heart failure with left ventricular diastolic dysfunction (HCC)   Chronic atrial fibrillation ([place])   1. Acute on chronic respiratory failure hypoxia continue T collar trials titrate oxygen continue pulmonary toilet. 2. Diaphragmatic paralysis he is at baseline will remain with the tracheostomy in place however has been liberated from the ventilator 3. History of DVT treated 4. Chronic congestive heart failure compensated 5. Chronic atrial fibrillation rate is controlled   I have personally seen and evaluated the patient, evaluated laboratory and imaging results, formulated the assessment and plan and placed orders. The Patient requires high complexity decision making with multiple systems involvement.  Rounds were done with the Respiratory Therapy Director and Staff therapists and discussed with nursing staff also.  Allyne Gee, MD [facility] Pulmonary Critical Care Medicine Sleep Medicine

## 2019-06-29 DIAGNOSIS — I482 Chronic atrial fibrillation, unspecified: Secondary | ICD-10-CM | POA: Diagnosis not present

## 2019-06-29 DIAGNOSIS — J9621 Acute and chronic respiratory failure with hypoxia: Secondary | ICD-10-CM | POA: Diagnosis not present

## 2019-06-29 DIAGNOSIS — J986 Disorders of diaphragm: Secondary | ICD-10-CM | POA: Diagnosis not present

## 2019-06-29 DIAGNOSIS — I5032 Chronic diastolic (congestive) heart failure: Secondary | ICD-10-CM | POA: Diagnosis not present

## 2019-06-29 NOTE — Progress Notes (Addendum)
Pulmonary Critical Care Medicine [place]   PULMONARY CRITICAL CARE SERVICE  PROGRESS NOTE  Date of Service: 06/29/2019  Travis Brewer  [id number]  DOB: [birth year]   DOA: 06/04/2019  Referring Physician: Merton Border, MD  HPI: Travis Brewer is a 85 y.o. male seen for follow up of Acute on Chronic Respiratory Failure.  Patient is on aerosol trach collar 28% FiO2 satting well no fever or distress.  Medications: Reviewed on Rounds  Physical Exam:  Vitals: Pulse 73 respirations 24 BP 113/71 O2 sat 98% temp 98.1  Ventilator Settings 28% ATC  . General: Comfortable at this time . Eyes: Grossly normal lids, irises & conjunctiva . ENT: grossly tongue is normal . Neck: no obvious mass . Cardiovascular: S1 S2 normal no gallop . Respiratory: No rales or rhonchi noted . Abdomen: soft . Skin: no rash seen on limited exam . Musculoskeletal: not rigid . Psychiatric:unable to assess . Neurologic: no seizure no involuntary movements         Lab Data:   Basic Metabolic Panel: Recent Labs  Lab 06/22/19 1207 06/25/19 0425  NA  --  142  K 3.5 4.0  CL  --  104  CO2  --  29  GLUCOSE  --  108*  BUN  --  19  CREATININE  --  0.96  CALCIUM  --  9.1  MG  --  1.9    ABG: No results for input(s): PHART, PCO2ART, PO2ART, HCO3, O2SAT in the last 168 hours.  Liver Function Tests: No results for input(s): AST, ALT, ALKPHOS, BILITOT, PROT, ALBUMIN in the last 168 hours. No results for input(s): LIPASE, AMYLASE in the last 168 hours. No results for input(s): AMMONIA in the last 168 hours.  CBC: Recent Labs  Lab 06/25/19 0425  WBC 5.7  HGB 8.2*  HCT 29.0*  MCV 89.0  PLT 267    Cardiac Enzymes: No results for input(s): CKTOTAL, CKMB, CKMBINDEX, TROPONINI in the last 168 hours.  BNP (last 3 results) No results for input(s): BNP in the last 8760 hours.  ProBNP (last 3 results) No results for input(s): PROBNP in the last 8760 hours.  Radiological  Exams: No results found.  Assessment/Plan Active Problems:   Acute on chronic respiratory failure with hypoxia (HCC)   Diaphragmatic paralysis   History of DVT of lower extremity   Chronic congestive heart failure with left ventricular diastolic dysfunction (HCC)   Chronic atrial fibrillation ([place])   1. Acute on chronic respiratory failure hypoxia continue T collar trials titrate oxygen continue pulmonary toilet. 2. Diaphragmatic paralysis he is at baseline will remain with the tracheostomy in place however has been liberated from the ventilator 3. History of DVT treated 4. Chronic congestive heart failure compensated 5. Chronic atrial fibrillation rate is controlled   I have personally seen and evaluated the patient, evaluated laboratory and imaging results, formulated the assessment and plan and placed orders. The Patient requires high complexity decision making with multiple systems involvement.  Rounds were done with the Respiratory Therapy Director and Staff therapists and discussed with nursing staff also.  Allyne Gee, MD [facility] Pulmonary Critical Care Medicine Sleep Medicine

## 2019-06-30 LAB — URINALYSIS, ROUTINE W REFLEX MICROSCOPIC
Bacteria, UA: NONE SEEN
Bilirubin Urine: NEGATIVE
Glucose, UA: NEGATIVE mg/dL
Ketones, ur: NEGATIVE mg/dL
Nitrite: NEGATIVE
Protein, ur: 100 mg/dL — AB
RBC / HPF: >50 RBC/hpf — ABNORMAL HIGH (ref 0–5)
Specific Gravity, Urine: 1.014 (ref 1.005–1.030)
pH: 7 (ref 5.0–8.0)

## 2019-07-01 DIAGNOSIS — I482 Chronic atrial fibrillation, unspecified: Secondary | ICD-10-CM | POA: Diagnosis not present

## 2019-07-01 DIAGNOSIS — I5032 Chronic diastolic (congestive) heart failure: Secondary | ICD-10-CM | POA: Diagnosis not present

## 2019-07-01 DIAGNOSIS — J986 Disorders of diaphragm: Secondary | ICD-10-CM | POA: Diagnosis not present

## 2019-07-01 DIAGNOSIS — J9621 Acute and chronic respiratory failure with hypoxia: Secondary | ICD-10-CM | POA: Diagnosis not present

## 2019-07-01 LAB — BASIC METABOLIC PANEL
Anion gap: 12 (ref 5–15)
BUN: 20 mg/dL (ref 8–23)
CO2: 27 mmol/L (ref 22–32)
Calcium: 9.7 mg/dL (ref 8.9–10.3)
Chloride: 99 mmol/L (ref 98–111)
Creatinine, Ser: 0.83 mg/dL (ref 0.61–1.24)
GFR calc Af Amer: >60 mL/min (ref >60)
GFR calc non Af Amer: >60 mL/min (ref >60)
Glucose, Bld: 110 mg/dL — ABNORMAL HIGH (ref 70–99)
Potassium: 4.2 mmol/L (ref 3.5–5.1)
Sodium: 138 mmol/L (ref 135–145)

## 2019-07-01 LAB — CBC
HCT: 34.6 % — ABNORMAL LOW (ref 39.0–52.0)
Hemoglobin: 10.1 g/dL — ABNORMAL LOW (ref 13.0–17.0)
MCH: 26.2 pg (ref 26.0–34.0)
MCHC: 29.2 g/dL — ABNORMAL LOW (ref 30.0–36.0)
MCV: 89.6 fL (ref 80.0–100.0)
Platelets: 255 10*3/uL (ref 150–400)
RBC: 3.86 MIL/uL — ABNORMAL LOW (ref 4.22–5.81)
RDW: 23.5 % — ABNORMAL HIGH (ref 11.5–15.5)
WBC: 6.4 10*3/uL (ref 4.0–10.5)
nRBC: 0 % (ref 0.0–0.2)

## 2019-07-01 LAB — URINE CULTURE: Culture: NO GROWTH

## 2019-07-01 NOTE — Progress Notes (Addendum)
Pulmonary Critical Care Medicine [place]   PULMONARY CRITICAL CARE SERVICE  PROGRESS NOTE  Date of Service: 07/01/2019  Travis Brewer  [name]  DOB: 15-May-1934   DOA: 06/04/2019  Referring Physician: Merton Border, MD  HPI: Travis Brewer is a 85 y.o. male seen for follow up of Acute on Chronic Respiratory Failure.  Patient remains on 20% aerosol trach collar satting well no fever or distress.  Medications: Reviewed on Rounds  Physical Exam:  Vitals: Pulse 96 respirations 20 BP 106/68 O2 sat 98% temp 98.9  Ventilator Settings 28% ATC  . General: Comfortable at this time . Eyes: Grossly normal lids, irises & conjunctiva . ENT: grossly tongue is normal . Neck: no obvious mass . Cardiovascular: S1 S2 normal no gallop . Respiratory: No rales or rhonchi noted . Abdomen: soft . Skin: no rash seen on limited exam . Musculoskeletal: not rigid . Psychiatric:unable to assess . Neurologic: no seizure no involuntary movements         Lab Data:   Basic Metabolic Panel: Recent Labs  Lab 06/25/19 0425 07/01/19 0503  NA 142 138  K 4.0 4.2  CL 104 99  CO2 29 27  GLUCOSE 108* 110*  BUN 19 20  CREATININE 0.96 0.83  CALCIUM 9.1 9.7  MG 1.9  --     ABG: No results for input(s): PHART, PCO2ART, PO2ART, HCO3, O2SAT in the last 168 hours.  Liver Function Tests: No results for input(s): AST, ALT, ALKPHOS, BILITOT, PROT, ALBUMIN in the last 168 hours. No results for input(s): LIPASE, AMYLASE in the last 168 hours. No results for input(s): AMMONIA in the last 168 hours.  CBC: Recent Labs  Lab 06/25/19 0425 07/01/19 0503  WBC 5.7 6.4  HGB 8.2* 10.1*  HCT 29.0* 34.6*  MCV 89.0 89.6  PLT 267 255    Cardiac Enzymes: No results for input(s): CKTOTAL, CKMB, CKMBINDEX, TROPONINI in the last 168 hours.  BNP (last 3 results) No results for input(s): BNP in the last 8760 hours.  ProBNP (last 3 results) No results for input(s): PROBNP in the last  8760 hours.  Radiological Exams: No results found.  Assessment/Plan Active Problems:   Acute on chronic respiratory failure with hypoxia (HCC)   Diaphragmatic paralysis   History of DVT of lower extremity   Chronic congestive heart failure with left ventricular diastolic dysfunction (HCC)   Chronic atrial fibrillation ([place])   1. Acute on chronic respiratory failure hypoxia continue T collar trials titrate oxygen continue pulmonary toilet. 2. Diaphragmatic paralysis he is at baseline will remain with the tracheostomy in place however has been liberated from the ventilator 3. History of DVT treated 4. Chronic congestive heart failure compensated 5. Chronic atrial fibrillation rate is controlled   I have personally seen and evaluated the patient, evaluated laboratory and imaging results, formulated the assessment and plan and placed orders. The Patient requires high complexity decision making with multiple systems involvement.  Rounds were done with the Respiratory Therapy Director and Staff therapists and discussed with nursing staff also.  Allyne Gee, MD [facility] Pulmonary Critical Care Medicine Sleep Medicine

## 2019-07-02 DIAGNOSIS — J9621 Acute and chronic respiratory failure with hypoxia: Secondary | ICD-10-CM | POA: Diagnosis not present

## 2019-07-02 DIAGNOSIS — I5032 Chronic diastolic (congestive) heart failure: Secondary | ICD-10-CM | POA: Diagnosis not present

## 2019-07-02 DIAGNOSIS — I482 Chronic atrial fibrillation, unspecified: Secondary | ICD-10-CM | POA: Diagnosis not present

## 2019-07-02 DIAGNOSIS — J986 Disorders of diaphragm: Secondary | ICD-10-CM | POA: Diagnosis not present

## 2019-07-02 NOTE — Progress Notes (Addendum)
Pulmonary Critical Care Medicine [place]   PULMONARY CRITICAL CARE SERVICE  PROGRESS NOTE  Date of Service: 07/02/2019  Travis Brewer  [id number]  DOB: 24-Mar-1935   DOA: 06/04/2019  Referring Physician: Merton Border, MD  HPI: Travis Brewer is a 85 y.o. male seen for follow up of Acute on Chronic Respiratory Failure.  Patient remains on 28% T-bar satting well using PMV.  Medications: Reviewed on Rounds  Physical Exam:  Vitals: Pulse 100 respirations 39 BP 113/54 O2 sat 97% temp 98.3  Ventilator Settings ATC 28%  . General: Comfortable at this time . Eyes: Grossly normal lids, irises & conjunctiva . ENT: grossly tongue is normal . Neck: no obvious mass . Cardiovascular: S1 S2 normal no gallop . Respiratory: No rales or rhonchi noted . Abdomen: soft . Skin: no rash seen on limited exam . Musculoskeletal: not rigid . Psychiatric:unable to assess . Neurologic: no seizure no involuntary movements         Lab Data:   Basic Metabolic Panel: Recent Labs  Lab 07/01/19 0503  NA 138  K 4.2  CL 99  CO2 27  GLUCOSE 110*  BUN 20  CREATININE 0.83  CALCIUM 9.7    ABG: No results for input(s): PHART, PCO2ART, PO2ART, HCO3, O2SAT in the last 168 hours.  Liver Function Tests: No results for input(s): AST, ALT, ALKPHOS, BILITOT, PROT, ALBUMIN in the last 168 hours. No results for input(s): LIPASE, AMYLASE in the last 168 hours. No results for input(s): AMMONIA in the last 168 hours.  CBC: Recent Labs  Lab 07/01/19 0503  WBC 6.4  HGB 10.1*  HCT 34.6*  MCV 89.6  PLT 255    Cardiac Enzymes: No results for input(s): CKTOTAL, CKMB, CKMBINDEX, TROPONINI in the last 168 hours.  BNP (last 3 results) No results for input(s): BNP in the last 8760 hours.  ProBNP (last 3 results) No results for input(s): PROBNP in the last 8760 hours.  Radiological Exams: No results found.  Assessment/Plan Active Problems:   Acute on chronic respiratory  failure with hypoxia (HCC)   Diaphragmatic paralysis   History of DVT of lower extremity   Chronic congestive heart failure with left ventricular diastolic dysfunction (HCC)   Chronic atrial fibrillation ([place])   1. Acute on chronic respiratory failure hypoxia continue T collar trials titrate oxygen continue pulmonary toilet. 2. Diaphragmatic paralysis he is at baseline will remain with the tracheostomy in place however has been liberated from the ventilator 3. History of DVT treated 4. Chronic congestive heart failure compensated 5. Chronic atrial fibrillation rate is controlled   I have personally seen and evaluated the patient, evaluated laboratory and imaging results, formulated the assessment and plan and placed orders. The Patient requires high complexity decision making with multiple systems involvement.  Rounds were done with the Respiratory Therapy Director and Staff therapists and discussed with nursing staff also.  Allyne Gee, MD [facility] [place] Pulmonary Critical Care Medicine Sleep Medicine

## 2019-07-03 DIAGNOSIS — J986 Disorders of diaphragm: Secondary | ICD-10-CM | POA: Diagnosis not present

## 2019-07-03 DIAGNOSIS — J9621 Acute and chronic respiratory failure with hypoxia: Secondary | ICD-10-CM | POA: Diagnosis not present

## 2019-07-03 DIAGNOSIS — I5032 Chronic diastolic (congestive) heart failure: Secondary | ICD-10-CM | POA: Diagnosis not present

## 2019-07-03 DIAGNOSIS — I482 Chronic atrial fibrillation, unspecified: Secondary | ICD-10-CM | POA: Diagnosis not present

## 2019-07-03 NOTE — Progress Notes (Addendum)
Pulmonary Critical Care Medicine [place]   PULMONARY CRITICAL CARE SERVICE  PROGRESS NOTE  Date of Service: 07/03/2019  Travis Brewer  [id number]  DOB: [birth year]   DOA: 06/04/2019  Referring Physician: Merton Border, MD  HPI: Travis Brewer is a 85 y.o. male seen for follow up of Acute on Chronic Respiratory Failure.  Patient mains on aerosol trach collar 28% using PMV with no difficulty.  Medications: Reviewed on Rounds  Physical Exam:  Vitals: Pulse 88 respirations 31 BP 100/86 O2 sat 96% temp 97.2  Ventilator Settings 28% ATC  . General: Comfortable at this time . Eyes: Grossly normal lids, irises & conjunctiva . ENT: grossly tongue is normal . Neck: no obvious mass . Cardiovascular: S1 S2 normal no gallop . Respiratory: No rales or rhonchi noted . Abdomen: soft . Skin: no rash seen on limited exam . Musculoskeletal: not rigid . Psychiatric:unable to assess . Neurologic: no seizure no involuntary movements         Lab Data:   Basic Metabolic Panel: Recent Labs  Lab 07/01/19 0503  NA 138  K 4.2  CL 99  CO2 27  GLUCOSE 110*  BUN 20  CREATININE 0.83  CALCIUM 9.7    ABG: No results for input(s): PHART, PCO2ART, PO2ART, HCO3, O2SAT in the last 168 hours.  Liver Function Tests: No results for input(s): AST, ALT, ALKPHOS, BILITOT, PROT, ALBUMIN in the last 168 hours. No results for input(s): LIPASE, AMYLASE in the last 168 hours. No results for input(s): AMMONIA in the last 168 hours.  CBC: Recent Labs  Lab 07/01/19 0503  WBC 6.4  HGB 10.1*  HCT 34.6*  MCV 89.6  PLT 255    Cardiac Enzymes: No results for input(s): CKTOTAL, CKMB, CKMBINDEX, TROPONINI in the last 168 hours.  BNP (last 3 results) No results for input(s): BNP in the last 8760 hours.  ProBNP (last 3 results) No results for input(s): PROBNP in the last 8760 hours.  Radiological Exams: No results found.  Assessment/Plan Active Problems:   Acute on  chronic respiratory failure with hypoxia (HCC)   Diaphragmatic paralysis   History of DVT of lower extremity   Chronic congestive heart failure with left ventricular diastolic dysfunction (HCC)   Chronic atrial fibrillation ([place])   1. Acute on chronic respiratory failure hypoxia continue T collar trials titrate oxygen continue pulmonary toilet. 2. Diaphragmatic paralysis he is at baseline will remain with the tracheostomy in place however has been liberated from the ventilator 3. History of DVT treated 4. Chronic congestive heart failure compensated 5. Chronic atrial fibrillation rate is controlled   I have personally seen and evaluated the patient, evaluated laboratory and imaging results, formulated the assessment and plan and placed orders. The Patient requires high complexity decision making with multiple systems involvement.  Rounds were done with the Respiratory Therapy Director and Staff therapists and discussed with nursing staff also.  Allyne Gee, MD [facility] Pulmonary Critical Care Medicine Sleep Medicine

## 2019-07-04 DIAGNOSIS — I5032 Chronic diastolic (congestive) heart failure: Secondary | ICD-10-CM | POA: Diagnosis not present

## 2019-07-04 DIAGNOSIS — J986 Disorders of diaphragm: Secondary | ICD-10-CM | POA: Diagnosis not present

## 2019-07-04 DIAGNOSIS — I482 Chronic atrial fibrillation, unspecified: Secondary | ICD-10-CM | POA: Diagnosis not present

## 2019-07-04 DIAGNOSIS — J9621 Acute and chronic respiratory failure with hypoxia: Secondary | ICD-10-CM | POA: Diagnosis not present

## 2019-07-04 LAB — BASIC METABOLIC PANEL
Anion gap: 8 (ref 5–15)
BUN: 28 mg/dL — ABNORMAL HIGH (ref 8–23)
CO2: 30 mmol/L (ref 22–32)
Calcium: 9.4 mg/dL (ref 8.9–10.3)
Chloride: 101 mmol/L (ref 98–111)
Creatinine, Ser: 0.95 mg/dL (ref 0.61–1.24)
GFR calc Af Amer: >60 mL/min (ref >60)
GFR calc non Af Amer: >60 mL/min (ref >60)
Glucose, Bld: 116 mg/dL — ABNORMAL HIGH (ref 70–99)
Potassium: 4.1 mmol/L (ref 3.5–5.1)
Sodium: 139 mmol/L (ref 135–145)

## 2019-07-04 LAB — CBC
HCT: 32.2 % — ABNORMAL LOW (ref 39.0–52.0)
Hemoglobin: 9.4 g/dL — ABNORMAL LOW (ref 13.0–17.0)
MCH: 26.2 pg (ref 26.0–34.0)
MCHC: 29.2 g/dL — ABNORMAL LOW (ref 30.0–36.0)
MCV: 89.7 fL (ref 80.0–100.0)
Platelets: 230 10*3/uL (ref 150–400)
RBC: 3.59 MIL/uL — ABNORMAL LOW (ref 4.22–5.81)
RDW: 23.3 % — ABNORMAL HIGH (ref 11.5–15.5)
WBC: 5.3 10*3/uL (ref 4.0–10.5)
nRBC: 0 % (ref 0.0–0.2)

## 2019-07-04 NOTE — Progress Notes (Addendum)
Pulmonary Critical Care Medicine [place]   PULMONARY CRITICAL CARE SERVICE  PROGRESS NOTE  Date of Service: 07/04/2019  Travis Brewer  [id number]  DOB: 04/30/1934   DOA: 06/04/2019  Referring Physician: Merton Border, MD  HPI: Travis Brewer is a 85 y.o. male seen for follow up of Acute on Chronic Respiratory Failure.  Patient continues on 28% aerosol trach collar satting well using PMV with no difficulty.  Medications: Reviewed on Rounds  Physical Exam:  Vitals: Pulse 73 respirations 18 BP 130/62 O2 sat 99% temp 97.3  Ventilator Settings 28% ATC  . General: Comfortable at this time . Eyes: Grossly normal lids, irises & conjunctiva . ENT: grossly tongue is normal . Neck: no obvious mass . Cardiovascular: S1 S2 normal no gallop . Respiratory: No rales or rhonchi noted . Abdomen: soft . Skin: no rash seen on limited exam . Musculoskeletal: not rigid . Psychiatric:unable to assess . Neurologic: no seizure no involuntary movements         Lab Data:   Basic Metabolic Panel: Recent Labs  Lab 07/01/19 0503 07/04/19 0634  NA 138 139  K 4.2 4.1  CL 99 101  CO2 27 30  GLUCOSE 110* 116*  BUN 20 28*  CREATININE 0.83 0.95  CALCIUM 9.7 9.4    ABG: No results for input(s): PHART, PCO2ART, PO2ART, HCO3, O2SAT in the last 168 hours.  Liver Function Tests: No results for input(s): AST, ALT, ALKPHOS, BILITOT, PROT, ALBUMIN in the last 168 hours. No results for input(s): LIPASE, AMYLASE in the last 168 hours. No results for input(s): AMMONIA in the last 168 hours.  CBC: Recent Labs  Lab 07/01/19 0503 07/04/19 0634  WBC 6.4 5.3  HGB 10.1* 9.4*  HCT 34.6* 32.2*  MCV 89.6 89.7  PLT 255 230    Cardiac Enzymes: No results for input(s): CKTOTAL, CKMB, CKMBINDEX, TROPONINI in the last 168 hours.  BNP (last 3 results) No results for input(s): BNP in the last 8760 hours.  ProBNP (last 3 results) No results for input(s): PROBNP in the last  8760 hours.  Radiological Exams: No results found.  Assessment/Plan Active Problems:   Acute on chronic respiratory failure with hypoxia (HCC)   Diaphragmatic paralysis   History of DVT of lower extremity   Chronic congestive heart failure with left ventricular diastolic dysfunction (HCC)   Chronic atrial fibrillation ([place])   1. Acute on chronic respiratory failure hypoxia continue with T collar trials at this time.  Continue aggressive pulmonary toilet support measures. 2. Diaphragmatic paralysis he is at baseline will remain with the tracheostomy in place however has been liberated from the ventilator 3. History of DVT treated 4. Chronic congestive heart failure compensated 5. Chronic atrial fibrillation rate is controlled   I have personally seen and evaluated the patient, evaluated laboratory and imaging results, formulated the assessment and plan and placed orders. The Patient requires high complexity decision making with multiple systems involvement.  Rounds were done with the Respiratory Therapy Director and Staff therapists and discussed with nursing staff also.  Allyne Gee, MD [facility] Pulmonary Critical Care Medicine Sleep Medicine

## 2019-07-05 DIAGNOSIS — I5032 Chronic diastolic (congestive) heart failure: Secondary | ICD-10-CM | POA: Diagnosis not present

## 2019-07-05 DIAGNOSIS — J9621 Acute and chronic respiratory failure with hypoxia: Secondary | ICD-10-CM | POA: Diagnosis not present

## 2019-07-05 DIAGNOSIS — J986 Disorders of diaphragm: Secondary | ICD-10-CM | POA: Diagnosis not present

## 2019-07-05 DIAGNOSIS — I482 Chronic atrial fibrillation, unspecified: Secondary | ICD-10-CM | POA: Diagnosis not present

## 2019-07-05 NOTE — Progress Notes (Addendum)
Pulmonary Critical Care Medicine [place]   PULMONARY CRITICAL CARE SERVICE  PROGRESS NOTE  Date of Service: 07/05/2019  Travis Brewer  [id number]  DOB: October 30, 1934   DOA: 06/04/2019  Referring Physician: Merton Border, MD  HPI: Travis Brewer is a 85 y.o. male seen for follow up of Acute on Chronic Respiratory Failure.  Patient continues on 28% aerosol trach collar no change at this time continues PMV with no difficulty.  Medications: Reviewed on Rounds  Physical Exam:  Vitals: Pulse 95 respirations 26 BP 135/72 O2 sat 99% temp 7.4  Ventilator Settings ATC 28%  . General: Comfortable at this time . Eyes: Grossly normal lids, irises & conjunctiva . ENT: grossly tongue is normal . Neck: no obvious mass . Cardiovascular: S1 S2 normal no gallop . Respiratory: No rales or rhonchi noted . Abdomen: soft . Skin: no rash seen on limited exam . Musculoskeletal: not rigid . Psychiatric:unable to assess . Neurologic: no seizure no involuntary movements         Lab Data:   Basic Metabolic Panel: Recent Labs  Lab 07/01/19 0503 07/04/19 0634  NA 138 139  K 4.2 4.1  CL 99 101  CO2 27 30  GLUCOSE 110* 116*  BUN 20 28*  CREATININE 0.83 0.95  CALCIUM 9.7 9.4    ABG: No results for input(s): PHART, PCO2ART, PO2ART, HCO3, O2SAT in the last 168 hours.  Liver Function Tests: No results for input(s): AST, ALT, ALKPHOS, BILITOT, PROT, ALBUMIN in the last 168 hours. No results for input(s): LIPASE, AMYLASE in the last 168 hours. No results for input(s): AMMONIA in the last 168 hours.  CBC: Recent Labs  Lab 07/01/19 0503 07/04/19 0634  WBC 6.4 5.3  HGB 10.1* 9.4*  HCT 34.6* 32.2*  MCV 89.6 89.7  PLT 255 230    Cardiac Enzymes: No results for input(s): CKTOTAL, CKMB, CKMBINDEX, TROPONINI in the last 168 hours.  BNP (last 3 results) No results for input(s): BNP in the last 8760 hours.  ProBNP (last 3 results) No results for input(s): PROBNP  in the last 8760 hours.  Radiological Exams: No results found.  Assessment/Plan Active Problems:   Acute on chronic respiratory failure with hypoxia (HCC)   Diaphragmatic paralysis   History of DVT of lower extremity   Chronic congestive heart failure with left ventricular diastolic dysfunction (HCC)   Chronic atrial fibrillation ([place])   1. Acute on chronic respiratory failure hypoxia continue T collar trials titrate oxygen continue pulmonary toilet. 2. Diaphragmatic paralysis he is at baseline will remain with the tracheostomy in place however has been liberated from the ventilator 3. History of DVT treated 4. Chronic congestive heart failure compensated 5. Chronic atrial fibrillation rate is controlled   I have personally seen and evaluated the patient, evaluated laboratory and imaging results, formulated the assessment and plan and placed orders. The Patient requires high complexity decision making with multiple systems involvement.  Rounds were done with the Respiratory Therapy Director and Staff therapists and discussed with nursing staff also.  Allyne Gee, MD [facility] Pulmonary Critical Care Medicine Sleep Medicine

## 2019-07-06 DIAGNOSIS — J986 Disorders of diaphragm: Secondary | ICD-10-CM | POA: Diagnosis not present

## 2019-07-06 DIAGNOSIS — J9621 Acute and chronic respiratory failure with hypoxia: Secondary | ICD-10-CM | POA: Diagnosis not present

## 2019-07-06 DIAGNOSIS — I482 Chronic atrial fibrillation, unspecified: Secondary | ICD-10-CM | POA: Diagnosis not present

## 2019-07-06 DIAGNOSIS — I5032 Chronic diastolic (congestive) heart failure: Secondary | ICD-10-CM | POA: Diagnosis not present

## 2019-07-08 DIAGNOSIS — J9621 Acute and chronic respiratory failure with hypoxia: Secondary | ICD-10-CM | POA: Diagnosis not present

## 2019-07-08 DIAGNOSIS — J986 Disorders of diaphragm: Secondary | ICD-10-CM | POA: Diagnosis not present

## 2019-07-08 DIAGNOSIS — I482 Chronic atrial fibrillation, unspecified: Secondary | ICD-10-CM | POA: Diagnosis not present

## 2019-07-08 DIAGNOSIS — I5032 Chronic diastolic (congestive) heart failure: Secondary | ICD-10-CM | POA: Diagnosis not present

## 2019-07-08 LAB — BASIC METABOLIC PANEL
Anion gap: 10 (ref 5–15)
BUN: 36 mg/dL — ABNORMAL HIGH (ref 8–23)
CO2: 26 mmol/L (ref 22–32)
Calcium: 10 mg/dL (ref 8.9–10.3)
Chloride: 108 mmol/L (ref 98–111)
Creatinine, Ser: 0.71 mg/dL (ref 0.61–1.24)
GFR calc Af Amer: >60 mL/min (ref >60)
GFR calc non Af Amer: >60 mL/min (ref >60)
Glucose, Bld: 107 mg/dL — ABNORMAL HIGH (ref 70–99)
Potassium: 4.2 mmol/L (ref 3.5–5.1)
Sodium: 144 mmol/L (ref 135–145)

## 2019-07-08 LAB — CBC
HCT: 36.2 % — ABNORMAL LOW (ref 39.0–52.0)
Hemoglobin: 10.5 g/dL — ABNORMAL LOW (ref 13.0–17.0)
MCH: 26.6 pg (ref 26.0–34.0)
MCHC: 29 g/dL — ABNORMAL LOW (ref 30.0–36.0)
MCV: 91.9 fL (ref 80.0–100.0)
Platelets: 261 10*3/uL (ref 150–400)
RBC: 3.94 MIL/uL — ABNORMAL LOW (ref 4.22–5.81)
RDW: 23.1 % — ABNORMAL HIGH (ref 11.5–15.5)
WBC: 6.3 10*3/uL (ref 4.0–10.5)
nRBC: 0 % (ref 0.0–0.2)

## 2019-07-08 NOTE — Progress Notes (Addendum)
Pulmonary Critical Care Medicine [place]   PULMONARY CRITICAL CARE SERVICE  PROGRESS NOTE  Date of Service: 07/08/2019  Travis Brewer  [id number]  DOB: February 04, 1935   DOA: 06/04/2019  Referring Physician: Merton Border, MD  HPI: Travis Brewer is a 85 y.o. male seen for follow up of Acute on Chronic Respiratory Failure.  Patient remains on aerosol trach collar 28% FiO2 using PMV with no difficulty.  Satting well with no fever or distress.  Medications: Reviewed on Rounds  Physical Exam:  Vitals: Pulse 66 respirations 22 BP 117/68 O2 sat 98% temp 97.4  Ventilator Settings 28% ATC  . General: Comfortable at this time . Eyes: Grossly normal lids, irises & conjunctiva . ENT: grossly tongue is normal . Neck: no obvious mass . Cardiovascular: S1 S2 normal no gallop . Respiratory: No rales or rhonchi noted . Abdomen: soft . Skin: no rash seen on limited exam . Musculoskeletal: not rigid . Psychiatric:unable to assess . Neurologic: no seizure no involuntary movements         Lab Data:   Basic Metabolic Panel: Recent Labs  Lab 07/04/19 0634 07/08/19 0807  NA 139 144  K 4.1 4.2  CL 101 108  CO2 30 26  GLUCOSE 116* 107*  BUN 28* 36*  CREATININE 0.95 0.71  CALCIUM 9.4 10.0    ABG: No results for input(s): PHART, PCO2ART, PO2ART, HCO3, O2SAT in the last 168 hours.  Liver Function Tests: No results for input(s): AST, ALT, ALKPHOS, BILITOT, PROT, ALBUMIN in the last 168 hours. No results for input(s): LIPASE, AMYLASE in the last 168 hours. No results for input(s): AMMONIA in the last 168 hours.  CBC: Recent Labs  Lab 07/04/19 0634 07/08/19 0807  WBC 5.3 6.3  HGB 9.4* 10.5*  HCT 32.2* 36.2*  MCV 89.7 91.9  PLT 230 261    Cardiac Enzymes: No results for input(s): CKTOTAL, CKMB, CKMBINDEX, TROPONINI in the last 168 hours.  BNP (last 3 results) No results for input(s): BNP in the last 8760 hours.  ProBNP (last 3 results) No results  for input(s): PROBNP in the last 8760 hours.  Radiological Exams: No results found.  Assessment/Plan Active Problems:   Acute on chronic respiratory failure with hypoxia (HCC)   Diaphragmatic paralysis   History of DVT of lower extremity   Chronic congestive heart failure with left ventricular diastolic dysfunction (HCC)   Chronic atrial fibrillation ([place])   1. Acute on chronic respiratory failure hypoxia continue T collar trials titrate oxygen continue pulmonary toilet. 2. Diaphragmatic paralysis he is at baseline will remain with the tracheostomy in place however has been liberated from the ventilator 3. History of DVT treated 4. Chronic congestive heart failure compensated 5. Chronic atrial fibrillation rate is controlled   I have personally seen and evaluated the patient, evaluated laboratory and imaging results, formulated the assessment and plan and placed orders. The Patient requires high complexity decision making with multiple systems involvement.  Rounds were done with the Respiratory Therapy Director and Staff therapists and discussed with nursing staff also.  Allyne Gee, MD [facility] Pulmonary Critical Care Medicine Sleep Medicine

## 2019-07-08 NOTE — Progress Notes (Addendum)
Pulmonary Critical Care Medicine [place]   PULMONARY CRITICAL CARE SERVICE  PROGRESS NOTE  Date of Service: 07/08/2019  Travis Brewer  [id number]  DOB: [birth year]   DOA: 06/04/2019  Referring Physician: Merton Border, MD  HPI: Travis Brewer is a 85 y.o. male seen for follow up of Acute on Chronic Respiratory Failure.  Patient mains on aerosol trach collar 28% FiO2 using PMV with no difficulty satting well.  Medications: Reviewed on Rounds  Physical Exam:  Vitals: Pulse 73 respirations 24 BP 125/75 O2 sat 90% temp 96.8  Ventilator Settings 28% ATC  . General: Comfortable at this time . Eyes: Grossly normal lids, irises & conjunctiva . ENT: grossly tongue is normal . Neck: no obvious mass . Cardiovascular: S1 S2 normal no gallop . Respiratory: No rales or rhonchi noted . Abdomen: soft . Skin: no rash seen on limited exam . Musculoskeletal: not rigid . Psychiatric:unable to assess . Neurologic: no seizure no involuntary movements         Lab Data:   Basic Metabolic Panel: Recent Labs  Lab 07/04/19 0634 07/08/19 0807  NA 139 144  K 4.1 4.2  CL 101 108  CO2 30 26  GLUCOSE 116* 107*  BUN 28* 36*  CREATININE 0.95 0.71  CALCIUM 9.4 10.0    ABG: No results for input(s): PHART, PCO2ART, PO2ART, HCO3, O2SAT in the last 168 hours.  Liver Function Tests: No results for input(s): AST, ALT, ALKPHOS, BILITOT, PROT, ALBUMIN in the last 168 hours. No results for input(s): LIPASE, AMYLASE in the last 168 hours. No results for input(s): AMMONIA in the last 168 hours.  CBC: Recent Labs  Lab 07/04/19 0634 07/08/19 0807  WBC 5.3 6.3  HGB 9.4* 10.5*  HCT 32.2* 36.2*  MCV 89.7 91.9  PLT 230 261    Cardiac Enzymes: No results for input(s): CKTOTAL, CKMB, CKMBINDEX, TROPONINI in the last 168 hours.  BNP (last 3 results) No results for input(s): BNP in the last 8760 hours.  ProBNP (last 3 results) No results for input(s): PROBNP in the  last 8760 hours.  Radiological Exams: No results found.  Assessment/Plan Active Problems:   Acute on chronic respiratory failure with hypoxia (HCC)   Diaphragmatic paralysis   History of DVT of lower extremity   Chronic congestive heart failure with left ventricular diastolic dysfunction (HCC)   Chronic atrial fibrillation ([place])   1. Acute on chronic respiratory failure hypoxia continue T collar trials titrate oxygen continue pulmonary toilet. 2. Diaphragmatic paralysis he is at baseline will remain with the tracheostomy in place however has been liberated from the ventilator 3. History of DVT treated 4. Chronic congestive heart failure compensated 5. Chronic atrial fibrillation rate is controlled   I have personally seen and evaluated the patient, evaluated laboratory and imaging results, formulated the assessment and plan and placed orders. The Patient requires high complexity decision making with multiple systems involvement.  Rounds were done with the Respiratory Therapy Director and Staff therapists and discussed with nursing staff also.  Allyne Gee, MD [facility] Pulmonary Critical Care Medicine Sleep Medicine

## 2019-07-09 DIAGNOSIS — I5032 Chronic diastolic (congestive) heart failure: Secondary | ICD-10-CM | POA: Diagnosis not present

## 2019-07-09 DIAGNOSIS — I482 Chronic atrial fibrillation, unspecified: Secondary | ICD-10-CM | POA: Diagnosis not present

## 2019-07-09 DIAGNOSIS — J986 Disorders of diaphragm: Secondary | ICD-10-CM | POA: Diagnosis not present

## 2019-07-09 DIAGNOSIS — J9621 Acute and chronic respiratory failure with hypoxia: Secondary | ICD-10-CM | POA: Diagnosis not present

## 2019-07-09 LAB — SARS CORONAVIRUS 2 (TAT 6-24 HRS): SARS Coronavirus 2: NEGATIVE

## 2019-07-09 NOTE — Progress Notes (Signed)
Pulmonary Critical Care Medicine [place]   PULMONARY CRITICAL CARE SERVICE  PROGRESS NOTE  Date of Service: 07/09/2019  DERYCK SWOPES  [id number]  DOB: [birth year]   DOA: 06/04/2019  Referring Physician: Merton Border, MD  HPI: Travis Brewer is a 85 y.o. male seen for follow up of Acute on Chronic Respiratory Failure.  At this time patient is on T collar has been on 28% FiO2 this is baseline  Medications: Reviewed on Rounds  Physical Exam:  Vitals: Temperature is 97.7 pulse 62 respiratory 25 blood pressure 96/65 saturations 97%  Ventilator Settings off ventilator on T collar  . General: Comfortable at this time . Eyes: Grossly normal lids, irises & conjunctiva . ENT: grossly tongue is normal . Neck: no obvious mass . Cardiovascular: S1 S2 normal no gallop . Respiratory: No rhonchi no rales . Abdomen: soft . Skin: no rash seen on limited exam . Musculoskeletal: not rigid . Psychiatric:unable to assess . Neurologic: no seizure no involuntary movements         Lab Data:   Basic Metabolic Panel: Recent Labs  Lab 07/04/19 0634 07/08/19 0807  NA 139 144  K 4.1 4.2  CL 101 108  CO2 30 26  GLUCOSE 116* 107*  BUN 28* 36*  CREATININE 0.95 0.71  CALCIUM 9.4 10.0    ABG: No results for input(s): PHART, PCO2ART, PO2ART, HCO3, O2SAT in the last 168 hours.  Liver Function Tests: No results for input(s): AST, ALT, ALKPHOS, BILITOT, PROT, ALBUMIN in the last 168 hours. No results for input(s): LIPASE, AMYLASE in the last 168 hours. No results for input(s): AMMONIA in the last 168 hours.  CBC: Recent Labs  Lab 07/04/19 0634 07/08/19 0807  WBC 5.3 6.3  HGB 9.4* 10.5*  HCT 32.2* 36.2*  MCV 89.7 91.9  PLT 230 261    Cardiac Enzymes: No results for input(s): CKTOTAL, CKMB, CKMBINDEX, TROPONINI in the last 168 hours.  BNP (last 3 results) No results for input(s): BNP in the last 8760 hours.  ProBNP (last 3 results) No results for  input(s): PROBNP in the last 8760 hours.  Radiological Exams: No results found.  Assessment/Plan Active Problems:   Acute on chronic respiratory failure with hypoxia (HCC)   Diaphragmatic paralysis   History of DVT of lower extremity   Chronic congestive heart failure with left ventricular diastolic dysfunction (HCC)   Chronic atrial fibrillation ([place])   1. Acute on chronic respiratory failure with hypoxia plan is to continue with T collar weaning as tolerated secretions are still significant not a candidate for capping at this point 2. Diaphragmatic paralysis at baseline continue with supportive care 3. History of DVT treated 4. Chronic congestive heart failure baseline compensated 5. Chronic atrial fibrillation rate controlled   I have personally seen and evaluated the patient, evaluated laboratory and imaging results, formulated the assessment and plan and placed orders. The Patient requires high complexity decision making with multiple systems involvement.  Rounds were done with the Respiratory Therapy Director and Staff therapists and discussed with nursing staff also.  Allyne Gee, MD [facility] Pulmonary Critical Care Medicine Sleep Medicine

## 2019-09-19 ENCOUNTER — Encounter: Payer: Self-pay | Admitting: Internal Medicine

## 2019-09-19 ENCOUNTER — Ambulatory Visit (INDEPENDENT_AMBULATORY_CARE_PROVIDER_SITE_OTHER): Payer: Medicare Other | Admitting: Internal Medicine

## 2019-09-19 ENCOUNTER — Other Ambulatory Visit: Payer: Self-pay

## 2019-09-19 VITALS — BP 104/72 | HR 70 | Ht 67.0 in | Wt 148.6 lb

## 2019-09-19 DIAGNOSIS — I4811 Longstanding persistent atrial fibrillation: Secondary | ICD-10-CM | POA: Diagnosis not present

## 2019-09-19 DIAGNOSIS — Z951 Presence of aortocoronary bypass graft: Secondary | ICD-10-CM

## 2019-09-19 DIAGNOSIS — Z9889 Other specified postprocedural states: Secondary | ICD-10-CM | POA: Diagnosis not present

## 2019-09-19 DIAGNOSIS — E782 Mixed hyperlipidemia: Secondary | ICD-10-CM

## 2019-09-19 DIAGNOSIS — N528 Other male erectile dysfunction: Secondary | ICD-10-CM

## 2019-09-19 DIAGNOSIS — I2510 Atherosclerotic heart disease of native coronary artery without angina pectoris: Secondary | ICD-10-CM

## 2019-09-19 DIAGNOSIS — Z8679 Personal history of other diseases of the circulatory system: Secondary | ICD-10-CM

## 2019-09-19 MED ORDER — APIXABAN 5 MG PO TABS: 5.0000 mg | ORAL_TABLET | Freq: Two times a day (BID) | ORAL | 3 refills | Status: DC

## 2019-09-19 MED ORDER — ATORVASTATIN CALCIUM 80 MG PO TABS: 80.0000 mg | ORAL_TABLET | Freq: Every day | ORAL | 3 refills | Status: DC

## 2019-09-19 MED ORDER — FUROSEMIDE 20 MG PO TABS: 20.0000 mg | ORAL_TABLET | Freq: Every day | ORAL | 3 refills | Status: DC

## 2019-09-19 NOTE — Progress Notes (Signed)
OFFICE NOTE  Chief Complaint:  Establish cardiologist  Primary Care Physician: No primary care provider on file.  HPI:  Travis Brewer is a 85 y.o. male with a past medial history significant for recent diagnosis of ascending aortic aneurysm status post urgent elective surgery for aneurysm repair and possible replacement of a bicuspid aortic valve.  He underwent surgery at [facility] on 02/22/2019, undergoing an aneurysm replacement, resuspension of the aortic valve and CABG x2 with LIMA to LAD, SVG to D1.  Surgery was complicated by cardiac arrest that occurred shortly after sternotomy thought to be related to air embolism.  Ultimately the patient was successfully placed on bypass, the groin was explored and thought to be the source of air introduction during vein harvest.  Surgery then proceeded as scheduled however postoperatively was complicated by atrial fibrillation, 2 strokes, failure to wean off of the ventilator and ultimate placement of a trach and PEG.  He was subsequently [facility] to [facility] in [place] and spent most of March 2021 there, ultimately eventually being weaned off of the ventilator and having his PEG tube reversed.  He presents today with his wife is also a new patient of mine to establish cardiology care.  He was seen in the hospital by Dr. Doylene Canard, one of the local cardiologist, who had added digoxin for aid in A. fib rate control as well as possible RV inotropic stimulation.  He noted that the patient's chads vas score was 5 however anticoagulation was not recommended.  In fact I cannot understand why the patient has not been anticoagulated as there is no history of significant contraindication to this, but he did have 2 strokes and has had what appears to be persistent atrial fibrillation since surgery.  There is no evidence of left atrial appendage closure in fact it seemed that the surgery went pretty emergently after his arrest.  Recently  [name] has been complaining of positional dizziness, especially when bending over and sitting up too quickly or turning over to one side.  Blood pressure today was noted to be low at 104/72.  EKG showed that he is in A. fib with controlled ventricular response at 70-personally reviewed.  Other than this he reports he is slowly getting back to somewhat normal activities.  He did undergo some speech therapy.  He had an echo that was performed prior to bypass which showed normal LVEF and a subsequent echo in January which showed preserved LV function.  He denies any MI during the hospitalization.  He has no chest pain or shortness of breath.  He reports fatigue but it is difficult to know whether this is related to his A. fib.  The last echo in January while showing normal LVEF did show moderate to severe biatrial enlargement, suggesting that his A. fib may be challenging to rhythm control.  PMHx:  Past Medical History:  Diagnosis Date  . Acute on chronic respiratory failure with hypoxia ([place])   . BPH (benign prostatic hyperplasia)   . CAD (coronary artery disease)   . Chronic atrial fibrillation ([place])   . Chronic congestive heart failure with left ventricular diastolic dysfunction ([place])   . Diaphragmatic paralysis   . ED (erectile dysfunction)   . History of DVT of lower extremity   . 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 Diabetes Mother   . Stroke Father  SOCHx:   reports that he has never smoked. He has never used smokeless tobacco. No history on file for alcohol and drug.  ALLERGIES:  Allergies  Allergen Reactions  . Quetiapine Nausea And Vomiting  . Clonazepam Nausea Only    ROS: Pertinent items noted in HPI and remainder of comprehensive ROS otherwise negative.  HOME MEDS: Current Outpatient Medications on File Prior to Visit  Medication Sig Dispense Refill  . alendronate-cholecalciferol (FOSAMAX PLUS  D) 70-2800 MG-UNIT tablet Take by mouth.    . Ascorbic Acid (VITAMIN C) 1000 MG tablet Take 1,000 mg by mouth daily.    . DUREZOL 0.05 % EMUL Place 1 drop into both eyes as directed.    . metoprolol tartrate (LOPRESSOR) 25 MG tablet Take 1 tablet by mouth in the morning and at bedtime.    . Multiple Vitamins-Minerals (MULTIVITAMIN ADULT EXTRA C PO) Take 1 tablet by mouth daily.    . pantoprazole (PROTONIX) 40 MG tablet Take by mouth.    . tamsulosin (FLOMAX) 0.4 MG CAPS capsule Take by mouth.    . zinc sulfate 220 (50 Zn) MG capsule Take by mouth.     No current facility-administered medications on file prior to visit.    LABS/IMAGING: No results found for this or any previous visit (from the past 48 hour(s)). No results found.  LIPID PANEL: No results found for: CHOL, TRIG, HDL, CHOLHDL, VLDL, LDLCALC, LDLDIRECT   WEIGHTS: Wt Readings from Last 3 Encounters:  09/19/19 148 lb 9.6 oz (67.4 kg)    VITALS: BP 104/72   Pulse 70   Ht 5\' 7"  (1.702 m)   Wt 148 lb 9.6 oz (67.4 kg)   BMI 23.27 kg/m   EXAM: General appearance: alert, no distress and pale Neck: no carotid bruit, no JVD and thyroid not enlarged, symmetric, no tenderness/mass/nodules Lungs: clear to auscultation bilaterally Heart: irregularly irregular rhythm Abdomen: soft, non-tender; bowel sounds normal; no masses,  no organomegaly Extremities: extremities normal, atraumatic, no cyanosis or edema Pulses: 2+ and symmetric Skin: Skin color, texture, turgor normal. No rashes or lesions Neurologic: Grossly normal Psych: Pleasant  EKG: A. fib with controlled ventricular response of 70, nonspecific T wave changes- personally reviewed  ASSESSMENT: 1. Status post ascending aneurysm replacement (02/2019-[facility]) 2. CAD status post CABG x2 (LIMA to LAD, SVG to D1)-11/20, [facility] 3. Intraprocedural cardiac arrest secondary to possible air embolus 4. Prolonged ventilation necessitating trach  and PEG[facility] 5. Longstanding persistent atrial fibrillation postoperative with moderate to severe biatrial enlargement normal LV function by echo in January 2021, mild to moderate RV hypokinesis, CHADSVASC Score of 5 6. Dyslipidemia 7. BPH 8. ED  PLAN: 1.   Mr. Kubitz had a protracted course after surgery for an ascending aortic aneurysm which involved ultimate resuspension of the native aortic valve which was initially thought to be bicuspid, CABG x2 with complication of intraprocedural cardiac arrest, and longstanding persistent atrial fibrillation-not anticoagulated with a CHA2DS2-VASc score of 5.  He actually has made a remarkable recovery given the extent of his postoperative course.  He has not had repeat imaging that I am aware of despite [facility].  We will plan a repeat echo to reassess his aortic size as well as LV and RV function in about 3 months at follow-up.  Given his high CHA2DS2-VASc score and no clear contraindications to anticoagulation from what I can tell after an extensive review of his  chart, I would recommend starting Eliquis.  We will discontinue aspirin.  He has been on digoxin, started by Dr. Doylene Canard in March for RV dysfunction.  Levels have not been checked nor did he follow-up with his cardiologist.  There is little evidence for additional benefit of this medication with RV dysfunction as seen with LV dysfunction.  I would therefore recommend discontinuing digoxin due to its narrow therapeutic window and is most certainly having minimal effect and rate control regarding his A. fib.  He is also describing some positional dizziness which may likely be due to hypotension.  Blood pressure was in the low 100s today.  He is currently on metoprolol 25mg  3 times daily.  I advised decreasing that to twice daily.  This should help with his dizziness.  Additionally, he was concerned about issues with  BPH and erectile dysfunction.  He was previously on daily Cialis and wishes to go back on that instead of tamsulosin.  I will defer this to his primary care provider.  Finally he has been struggling with an inguinal hernia on the right and wishes to have a repair with Dr. Tally Due.  I would certainly consider him at an increased risk for surgery however not necessarily risk prohibitive.  Although we are planning on starting anticoagulation today, if he should proceed with that he would need to hold anticoagulation for 2 days prior to the procedure and restart as soon as practical afterwards.  Plan follow-up with me 3 months after his echo. Thanks for allowing me to participate in his care.  TIME SPENT WITH PATIENT: 65 minutes of direct patient care. More than 50% of that time was spent on coordination of care and counseling regarding complex medical problems and extensive review of old records and multiple databases.  Pixie Casino, MD, [facility], [place] Director of the Advanced Lipid Disorders &  Cardiovascular Risk Reduction Clinic Diplomate of the American Board of Clinical Lipidology Attending Cardiologist  Direct Dial: [phone]  Fax: [phone]  Website:  [email or web address] 09/19/2019, 9:48 PM

## 2019-09-19 NOTE — Patient Instructions (Signed)
Medication Instructions:  STOP aspirin STOP digoxin START eliquis twice daily DECREASE metoprolol to twice daily   *If you need a refill on your cardiac medications before your next appointment, please call your pharmacy*   Lab Work: NONE If you have labs (blood work) drawn today and your tests are completely normal, you will receive your results only by: Marland Kitchen MyChart Message (if you have MyChart) OR . A paper copy in the mail If you have any lab test that is abnormal or we need to change your treatment, we will call you to review the results.   Testing/Procedures: Echocardiogram in 3 months @ [address] Floor   Follow-Up: At [facility], you and your health needs are our priority.  As part of our continuing mission to provide you with exceptional heart care, we have created designated Provider Care Teams.  These Care Teams include your primary Cardiologist (physician) and Advanced Practice Providers (APPs -  Physician Assistants and Nurse Practitioners) who all work together to provide you with the care you need, when you need it.  We recommend signing up for the patient portal called "MyChart".  Sign up information is provided on this After Visit Summary.  MyChart is used to connect with patients for Virtual Visits (Telemedicine).  Patients are able to view lab/test results, encounter notes, upcoming appointments, etc.  Non-urgent messages can be sent to your provider as well.   To learn more about what you can do with MyChart, go to [email or web address].    Your next appointment:   3 month(s)  The format for your next appointment:   In Person  Provider:   You may see Dr. Debara Pickett or one of the following Advanced Practice Providers on your designated Care Team:    Almyra Deforest, PA-C  Fabian Sharp, [place] or   Roby Lofts, [place]    Other Instructions

## 2019-09-20 ENCOUNTER — Telehealth: Payer: Self-pay | Admitting: Internal Medicine

## 2019-09-20 DIAGNOSIS — K4090 Unilateral inguinal hernia, without obstruction or gangrene, not specified as recurrent: Secondary | ICD-10-CM

## 2019-09-20 NOTE — Telephone Encounter (Signed)
Pt called and reports he spoke with Dr. Debara Pickett yesterday at his appt and Dr. Debara Pickett offered a possible referral for a 2nd opinion for his hernia repair but the pt declined but he has thought about it and would like the referral made for the MD that Dr. Debara Pickett spoke with him about yesterday.. will forward to Dr. Debara Pickett for review.

## 2019-09-20 NOTE — Telephone Encounter (Signed)
Would refer to [facility] - Dr. 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  Dr Lemmie Evens

## 2019-09-20 NOTE — Telephone Encounter (Signed)
Wife notified of surgeon recommendations from Alden Hipp RN

## 2019-09-20 NOTE — Telephone Encounter (Signed)
New message   Per patient would like to know if he can get a referral for a hernia surgery. Please advise.

## 2019-09-20 NOTE — Telephone Encounter (Signed)
New Message   Pt is calling and would like to talk to Dr Rod Mae nurse  He did not say what the call was in reference too.    Please call

## 2019-09-20 NOTE — Telephone Encounter (Signed)
Pt advised and agrees to the referral.   Would refer to [facility] - Dr. 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

## 2019-09-20 NOTE — Telephone Encounter (Signed)
There is another open message RE: this call from the pt will address on the other phone note and close this call out in his chart.

## 2019-09-21 NOTE — Telephone Encounter (Signed)
Spoke with patient regarding appointment with Dr. Marlaine Hind scheduled Friday 10/12/19 at 11:00 am---arrival time is 10:30 am---[address]---[address]--be sure to take insurance cards and wear a mask.  Patient given phone # [phone] in case he needs to reschedule.  Patient voiced his understanding.

## 2019-10-10 ENCOUNTER — Other Ambulatory Visit: Payer: Self-pay | Admitting: Internal Medicine

## 2019-10-10 MED ORDER — APIXABAN 5 MG PO TABS: 5.0000 mg | ORAL_TABLET | Freq: Two times a day (BID) | ORAL | 3 refills | Status: DC

## 2019-10-10 NOTE — Telephone Encounter (Signed)
New Message:   Pt wants to know if he can start back taking his Difulcenac? If so, please call this in to [facility] please.

## 2019-10-10 NOTE — Telephone Encounter (Signed)
The patient is asking to start taking his Voltaren again. He was taken off of it when he has his recent surgery. He has since started on Eliquis. I informed the patient that this medication is typically contraindicated because it contains another anticoagulant (Aspirin) but I would ask Dr. Debara Pickett for another alternative as the patient says his arthritis is really bothering him.

## 2019-10-10 NOTE — Telephone Encounter (Signed)
*  STAT* If patient is at the pharmacy, call can be transferred to refill team.   1. Which medications need to be refilled? (please list name of each medication and dose if known) new prescription for Eliquis   2. Which pharmacy/location (including street and city if local pharmacy) is medication to be sent to?[facility]  3. Do they need a 30 day or 90 day supply? 90 days and refills  .

## 2019-10-10 NOTE — Telephone Encounter (Signed)
Pixie Casino, MD  You Just now (2:56 PM)     Ok to use voltaren topical with eliquis.  Dr H       Pt made aware of the above advisement from Dr. Debara Pickett.

## 2019-10-10 NOTE — Telephone Encounter (Signed)
Ok to use voltaren topical with eliquis.  Dr Lemmie Evens

## 2019-10-10 NOTE — Telephone Encounter (Signed)
Rx(s) sent to pharmacy electronically.  

## 2019-10-11 ENCOUNTER — Telehealth: Payer: Self-pay | Admitting: Internal Medicine

## 2019-10-11 NOTE — Telephone Encounter (Signed)
Spoke with patient.  Explained Eliquis dosing criteria.  Patient is 18, weighs 67 kg and has a SCr of 0.71.  Based on this information, he is on the correct dose of 5 mg bid.  He states [facility] has sent a form to Dr. Debara Pickett to verify dose.    Will forward to [place] (Dr. Debara Pickett RN) for her to watch for form.

## 2019-10-11 NOTE — Telephone Encounter (Signed)
    Pt c/o medication issue:  1. Name of Medication: Eliquis  2. How are you currently taking this medication (dosage and times per day)?   3. Are you having a reaction (difficulty breathing--STAT)?   4. What is your medication issue? [facility] said [facility] him said they fax over a letter to Dr. Debara Pickett saying people over 83 years old should reduced eliquis to 2.5 mg twice a day unless the doctor over rides it. He would like to speak with [facility] for clarification

## 2019-10-11 NOTE — Telephone Encounter (Signed)
Will forward to Pharm D for review  

## 2019-10-12 ENCOUNTER — Ambulatory Visit: Payer: Self-pay | Admitting: Surgery

## 2019-10-12 NOTE — H&P (Signed)
History of Present Illness Imogene Burn. Tanaja Ganger MD; 10/12/2019 12:22 PM) The patient is a 85 year old male who presents with an inguinal hernia. Referred by Dr. Mali Hilty for [facility]  This is an 85 year old male who recently moved to [place] from [place]. In November 2020, he underwent CABG 2, aortic aneurysm replacement, and resuspension of aortic valve. This was performed at [facility]. He had significant complications associated with the surgery including intraoperative cardiac arrest, atrial fibrillation, 2 strokes, prolonged vent dependence, trach and PEG. He is anticoagulated on Eliquis. He continues to work with home health physical therapy and still requires the use of a walker. He is scheduled to have cataract surgery next week. Prior to his heart surgery, he was quite active.   Past Surgical History Darden [place], [place]; 10/12/2019 11:12 AM) Aneurysm Repair Coronary Artery Bypass Graft Knee Surgery Bilateral.  Diagnostic Studies History Darden Palmer, [place]; 10/12/2019 11:12 AM) Colonoscopy 5-10 years ago  Allergies Darden Palmer, RMA; 10/12/2019 11:14 AM) No Known Drug Allergies [10/12/2019]:  Medication History Darden Palmer, RMA; 10/12/2019 11:15 AM) Eliquis (5MG  Tablet, Oral) Active. Atorvastatin Calcium (80MG  Tablet, Oral) Active. Furosemide (20MG  Tablet, Oral) Active. Durezol (0.05% Emulsion, Ophthalmic) Active. Pantoprazole Sodium (40MG  Tablet DR, Oral) Active. Tamsulosin HCl (0.4MG  Capsule, Oral) Active. Metoprolol Tartrate (25MG  Tablet, Oral) Active.  Social History Darden Palmer, [place]; 10/12/2019 11:12 AM) Alcohol use Moderate alcohol use. Tobacco use Never smoker.  Family History Darden Palmer, [place]; 10/12/2019 11:12 AM) Diabetes Mellitus Brother. Heart Disease Father.  Other Problems Darden Palmer, [place]; 10/12/2019 11:12 AM) Arthritis Back Pain Cerebrovascular Accident Enlarged Prostate Hepatitis Melanoma Umbilical Hernia  Repair     Review of Systems Lattie Haw [place] RMA; 10/12/2019 11:12 AM) HEENT Present- Ringing in the Ears. Not Present- Earache, Hearing Loss, Hoarseness, Nose Bleed, Oral Ulcers, Seasonal Allergies, Sinus Pain, Sore Throat, Visual Disturbances, Wears glasses/contact lenses and Yellow Eyes. Hematology Present- Blood Thinners. Not Present- Easy Bruising, Excessive bleeding, Gland problems, HIV and Persistent Infections.  Vitals Lattie Haw [place] RMA; 10/12/2019 11:16 AM) 10/12/2019 11:15 AM Weight: 152.25 lb Height: 67in Body Surface Area: 1.8 m Body Mass Index: 23.85 kg/m  Temp.: 97.23F  Pulse: 70 (Regular)  BP: 118/74(Sitting, Left Arm, Standard)        Physical Exam Rodman Key K. Braxon Suder MD; 10/12/2019 12:23 PM)  The physical exam findings are as follows: Note:Constitutional: WDWN in NAD, conversant, no obvious deformities; resting comfortably Eyes: Pupils equal, round; sclera anicteric; moist conjunctiva; no lid lag HENT: Oral mucosa moist; good dentition Neck: No masses palpated, trachea midline with healed trach site; no thyromegaly Lungs: CTA bilaterally; normal respiratory effort CV: Regular rate and rhythm; no murmurs; extremities well-perfused with no edema Abd: +bowel sounds, soft, non-tender, no palpable organomegaly; no palpable hernias; healed PEG site GU: Bilateral descended testes, no testicular masses, large reducible right inguinal hernia containing bowel, no sign of left inguinal hernia Musc: Normal gait; no apparent clubbing or cyanosis in extremities Lymphatic: No palpable cervical or axillary lymphadenopathy Skin: Warm, dry; no sign of jaundice Psychiatric - alert and oriented x 4; calm mood and affect    Assessment & Plan Rodman Key K. Lynley Killilea MD; 10/12/2019 12:24 PM)  INGUINAL HERNIA OF RIGHT SIDE WITHOUT OBSTRUCTION OR GANGRENE (K40.90)  Current Plans Schedule for Surgery - Right inguinal hernia repair with mesh. The surgical procedure has been  discussed with the patient. Potential risks, benefits, alternative treatments, and expected outcomes have been explained. All of the patient's questions at this time have been answered. The likelihood of reaching  the patient's treatment goal is good. The patient understand the proposed surgical procedure and wishes to proceed. Note:The patient may proceed with his cataract surgery. We will wait about a month after that surgery until he is fully recovered before proceeding with a hernia repair. I do not think that the patient should put off having his inguinal hernia repaired as it is fairly large and already contain some small bowel. His risk of incarceration or strangulation is elevated. We will perform surgery at [facility] and plan to keep him at least one night due to his recent cardiac surgery history.   Imogene Burn. Georgette Dover, MD, [facility] Surgery  General/ Trauma Surgery   10/12/2019 12:24 PM

## 2019-10-16 ENCOUNTER — Other Ambulatory Visit: Payer: Self-pay

## 2019-10-16 MED ORDER — APIXABAN 5 MG PO TABS: 5.0000 mg | ORAL_TABLET | Freq: Two times a day (BID) | ORAL | 0 refills | Status: DC

## 2019-10-16 NOTE — Telephone Encounter (Signed)
Form received. MD will be in office 10/18/19 to sign

## 2019-10-16 NOTE — Telephone Encounter (Signed)
Sent in RX to pharmacy  ° °

## 2019-10-16 NOTE — Telephone Encounter (Signed)
Follow up  [facility] said [facility] him and haven't heard from Dr. Debara Pickett to refill his eliquis, he is out of the medication and needs an emergency 10 day refill to be sent to [facility] [place], [address] AT [place]

## 2019-10-18 NOTE — Telephone Encounter (Signed)
Form faxed to [phone]

## 2019-10-19 ENCOUNTER — Telehealth: Payer: Self-pay | Admitting: Internal Medicine

## 2019-10-19 NOTE — Telephone Encounter (Signed)
Received a phone call today from [place], [place] with [facility] - she spoke with a Dr. Lynnette Caffey, who is the patient's neurologist - apparently, he is concerned about the risk of intracerebral hemorrhage on anticoagulation due to cerebral amyloid angiopathy. He is not recommending anticoagulation, despite the patient's afib and increased risk of embolic stroke. Will follow his recommendations. He is okay with low dose aspirin, however, from an afib standpoint that is not likely to be effective anticoagulation against stroke.  Pixie Casino, MD, [facility], [place] Director of the Advanced Lipid Disorders &  Cardiovascular Risk Reduction Clinic Diplomate of the American Board of Clinical Lipidology Attending Cardiologist  Direct Dial: [phone]  Fax: [phone]  Website:  [email or web address]

## 2019-10-19 NOTE — Telephone Encounter (Signed)
Called was transferred to [place] did advise PA that she should discuss with Dr.Hilty and Neuro doctor to decide what was best.

## 2019-10-19 NOTE — Telephone Encounter (Signed)
I have Princella Ion, who is  A PA  from [facility] that needs to speak with Dr. Debara Pickett regarding the patient. If he's not available, a nurse would be ok to speak with. Please call her at her direct number at [phone]

## 2019-10-23 ENCOUNTER — Telehealth: Payer: Self-pay | Admitting: Internal Medicine

## 2019-10-23 NOTE — Telephone Encounter (Signed)
    Pt c/o medication issue:  1. Name of Medication: voltaren osteo 12 hourly gel  2. How are you currently taking this medication (dosage and times per day)?   3. Are you having a reaction (difficulty breathing--STAT)?   4. What is your medication issue? Pt would like to restart taking this meds again and would like to know if its ok. Pt has appt and he said to call him around 4 pm today

## 2019-10-23 NOTE — Telephone Encounter (Signed)
Travis Brewer is calling wanting to confirm that it is the 12 hourly gel he's talking about. Please advise.

## 2019-10-23 NOTE — Telephone Encounter (Signed)
Ok to take Voltaren gel.  Dr Lemmie Evens

## 2019-10-24 NOTE — Telephone Encounter (Signed)
Pixie Casino, MD  13 hours ago (6:41 PM)     Ok to take Voltaren gel.  Dr H     Pt advised of the above message from Dr. Debara Pickett and verbalized understanding.

## 2019-10-31 ENCOUNTER — Ambulatory Visit: Payer: Medicare Other | Admitting: Internal Medicine

## 2019-11-07 NOTE — Progress Notes (Signed)
[id number] [address], [address] AT [facility] [address] [place] [address] Phone: [phone] Fax: [phone]  [facility] [place], [place] [address] [place] [address] Phone: [phone] Fax: [phone]      Your procedure is scheduled on Wednesday, August 4th.  Report to [facility] Main Entrance "A" at 9:30 A.M., and check in at the Admitting office.  Call this number if you have problems the morning of surgery:  [phone]  Call [phone] if you have any questions prior to your surgery date Monday-Friday 8am-4pm    Remember:  Do not eat after midnight the night before your surgery  You may drink clear liquids until 8:30 AM the morning of your surgery.   Clear liquids allowed are: Water, Non-Citrus Juices (without pulp), Carbonated Beverages, Clear Tea, Black Coffee Only, and Gatorade    Take these medicines the morning of surgery with A SIP OF WATER   Tylenol - if needed  Atorvastatin (Lipitor)  Eye drops   Metoprolol  Follow your surgeon's instructions on when to stop Aspirin.  If no instructions were given by your surgeon then you will need to call the office to get those instructions.    As of today, STOP taking any Aleve, Naproxen, Ibuprofen, Motrin, Advil, Goody's, BC's, all herbal medications, fish oil, and all vitamins.                      Do not wear jewelry            Do not wear lotions, powders, colognes, or deodorant.            Men may shave face and neck.            Do not bring valuables to the hospital.            [facility] is not responsible for any belongings or valuables.  Do NOT Smoke (Tobacco/Vaping) or drink Alcohol 24 hours prior to your procedure If you use a CPAP at night, you may bring all equipment for your overnight stay.   Contacts, glasses, dentures or bridgework may not be worn into surgery.      For  patients admitted to the hospital, discharge time will be determined by your treatment team.   Patients discharged the day of surgery will not be allowed to drive home, and someone needs to stay with them for 24 hours.    Special instructions:   [place]- Preparing For Surgery  Before surgery, you can play an important role. Because skin is not sterile, your skin needs to be as free of germs as possible. You can reduce the number of germs on your skin by washing with CHG (chlorahexidine gluconate) Soap before surgery.  CHG is an antiseptic cleaner which kills germs and bonds with the skin to continue killing germs even after washing.    Oral Hygiene is also important to reduce your risk of infection.  Remember - BRUSH YOUR TEETH THE MORNING OF SURGERY WITH YOUR REGULAR TOOTHPASTE  Please do not use if you have an allergy to CHG or antibacterial soaps. If your skin becomes reddened/irritated stop using the CHG.  Do not shave (including legs and underarms) for at least 48 hours prior to first CHG shower. It is OK to shave your face.  Please follow these instructions carefully.  1. Shower the NIGHT BEFORE SURGERY and the MORNING OF SURGERY with CHG Soap.   2. If you chose to wash your hair, wash your hair first as usual with your normal shampoo.  3. After you shampoo, rinse your hair and body thoroughly to remove the shampoo.  4. Use CHG as you would any other liquid soap. You can apply CHG directly to the skin and wash gently with a scrungie or a clean washcloth.   5. Apply the CHG Soap to your body ONLY FROM THE NECK DOWN.  Do not use on open wounds or open sores. Avoid contact with your eyes, ears, mouth and genitals (private parts). Wash Face and genitals (private parts)  with your normal soap.   6. Wash thoroughly, paying special attention to the area where your surgery will be performed.  7. Thoroughly rinse your body with warm water from the neck down.  8. DO NOT shower/wash  with your normal soap after using and rinsing off the CHG Soap.  9. Pat yourself dry with a CLEAN TOWEL.  10. Wear CLEAN PAJAMAS to bed the night before surgery  11. Place CLEAN SHEETS on your bed the night of your first shower and DO NOT SLEEP WITH PETS.   Day of Surgery: Wear Clean/Comfortable clothing the morning of surgery Do not apply any deodorants/lotions.   Remember to brush your teeth WITH YOUR REGULAR TOOTHPASTE.   Please read over the following fact sheets that you were given.

## 2019-11-08 ENCOUNTER — Other Ambulatory Visit: Payer: Self-pay

## 2019-11-08 ENCOUNTER — Encounter (HOSPITAL_COMMUNITY)
Admission: RE | Admit: 2019-11-08 | Discharge: 2019-11-08 | Disposition: A | Discharge status: Home / Self Care | Payer: Medicare Other | Source: Ambulatory Visit | Attending: Surgery | Admitting: Surgery

## 2019-11-08 ENCOUNTER — Encounter (HOSPITAL_COMMUNITY): Payer: Self-pay

## 2019-11-08 DIAGNOSIS — I482 Chronic atrial fibrillation, unspecified: Secondary | ICD-10-CM | POA: Insufficient documentation

## 2019-11-08 DIAGNOSIS — Z86718 Personal history of other venous thrombosis and embolism: Secondary | ICD-10-CM | POA: Insufficient documentation

## 2019-11-08 DIAGNOSIS — Z8673 Personal history of transient ischemic attack (TIA), and cerebral infarction without residual deficits: Secondary | ICD-10-CM | POA: Diagnosis not present

## 2019-11-08 DIAGNOSIS — Z7901 Long term (current) use of anticoagulants: Secondary | ICD-10-CM | POA: Diagnosis not present

## 2019-11-08 DIAGNOSIS — Z01812 Encounter for preprocedural laboratory examination: Secondary | ICD-10-CM | POA: Diagnosis present

## 2019-11-08 DIAGNOSIS — I2510 Atherosclerotic heart disease of native coronary artery without angina pectoris: Secondary | ICD-10-CM | POA: Diagnosis not present

## 2019-11-08 DIAGNOSIS — K4090 Unilateral inguinal hernia, without obstruction or gangrene, not specified as recurrent: Secondary | ICD-10-CM | POA: Insufficient documentation

## 2019-11-08 DIAGNOSIS — Z79899 Other long term (current) drug therapy: Secondary | ICD-10-CM | POA: Diagnosis not present

## 2019-11-08 DIAGNOSIS — Z7982 Long term (current) use of aspirin: Secondary | ICD-10-CM | POA: Insufficient documentation

## 2019-11-08 HISTORY — DX: Inflammatory liver disease, unspecified: K75.9

## 2019-11-08 HISTORY — DX: Pneumonia, unspecified organism: J18.9

## 2019-11-08 HISTORY — DX: Cardiac arrhythmia, unspecified: I49.9

## 2019-11-08 HISTORY — DX: Dyspnea, unspecified: R06.00

## 2019-11-08 HISTORY — DX: Organ-limited amyloidosis: E85.4

## 2019-11-08 HISTORY — DX: Malignant (primary) neoplasm, unspecified: C80.1

## 2019-11-08 LAB — CBC
HCT: 37 % — ABNORMAL LOW (ref 39.0–52.0)
Hemoglobin: 11.2 g/dL — ABNORMAL LOW (ref 13.0–17.0)
MCH: 29 pg (ref 26.0–34.0)
MCHC: 30.3 g/dL (ref 30.0–36.0)
MCV: 95.9 fL (ref 80.0–100.0)
Platelets: 161 10*3/uL (ref 150–400)
RBC: 3.86 MIL/uL — ABNORMAL LOW (ref 4.22–5.81)
RDW: 17.4 % — ABNORMAL HIGH (ref 11.5–15.5)
WBC: 6.5 10*3/uL (ref 4.0–10.5)
nRBC: 0 % (ref 0.0–0.2)

## 2019-11-08 LAB — COMPREHENSIVE METABOLIC PANEL
ALT: 23 U/L (ref 0–44)
AST: 27 U/L (ref 15–41)
Albumin: 4 g/dL (ref 3.5–5.0)
Alkaline Phosphatase: 131 U/L — ABNORMAL HIGH (ref 38–126)
Anion gap: 8 (ref 5–15)
BUN: 22 mg/dL (ref 8–23)
CO2: 25 mmol/L (ref 22–32)
Calcium: 9.2 mg/dL (ref 8.9–10.3)
Chloride: 103 mmol/L (ref 98–111)
Creatinine, Ser: 0.95 mg/dL (ref 0.61–1.24)
GFR calc Af Amer: >60 mL/min (ref >60)
GFR calc non Af Amer: >60 mL/min (ref >60)
Glucose, Bld: 83 mg/dL (ref 70–99)
Potassium: 4.1 mmol/L (ref 3.5–5.1)
Sodium: 136 mmol/L (ref 135–145)
Total Bilirubin: 1.3 mg/dL — ABNORMAL HIGH (ref 0.3–1.2)
Total Protein: 6.7 g/dL (ref 6.5–8.1)

## 2019-11-08 NOTE — Progress Notes (Signed)
PCP - Willeen Niece, PA Cardiologist - Lyman Bishop, MD  PPM/ICD - Denies  Chest x-ray - N/A EKG - 10/19/19 Stress Test - > 5 years ago, done at a Cardiology office in [place]. Patient does not remember the name of the office. Per patient, results were negative. ECHO - 09/19/19 Cardiac Cath - 02/15/19  Sleep Study - Denies  Patient denies being diabetic.  Blood Thinner Instructions: N/A Aspirin Instructions:  ERAS Protcol - Yes PRE-SURGERY Ensure or G2- None ordered  COVID TEST- 11/10/19 @ [facility]   Anesthesia review: Yes, cardiac hx. Abnormal EKG  Patient denies shortness of breath, fever, cough and chest pain at PAT appointment   All instructions explained to the patient, with a verbal understanding of the material. Patient agrees to go over the instructions while at home for a better understanding. Patient also instructed to self quarantine after being tested for COVID-19. The opportunity to ask questions was provided.

## 2019-11-09 ENCOUNTER — Encounter (HOSPITAL_COMMUNITY): Payer: Self-pay

## 2019-11-09 NOTE — Anesthesia Preprocedure Evaluation (Addendum)
Anesthesia Evaluation  Patient identified by MRN, date of birth, ID band Patient awake    Reviewed: Allergy & Precautions, NPO status , Patient's Chart, lab work & pertinent test results  Airway Mallampati: II  TM Distance: >3 FB Neck ROM: Full    Dental  (+) Dental Advisory Given   Pulmonary shortness of breath,    breath sounds clear to auscultation       Cardiovascular hypertension, Pt. on medications and Pt. on home beta blockers + CAD, + CABG and +CHF  + dysrhythmias  Rhythm:Regular Rate:Normal     Neuro/Psych  Neuromuscular disease CVA    GI/Hepatic negative GI ROS, Neg liver ROS,   Endo/Other  negative endocrine ROS  Renal/GU negative Renal ROS     Musculoskeletal   Abdominal   Peds  Hematology negative hematology ROS (+)   Anesthesia Other Findings   Reproductive/Obstetrics                             Lab Results  Component Value Date   WBC 6.5 11/08/2019   HGB 11.2 (L) 11/08/2019   HCT 37.0 (L) 11/08/2019   MCV 95.9 11/08/2019   PLT 161 11/08/2019   Lab Results  Component Value Date   CREATININE 0.95 11/08/2019   BUN 22 11/08/2019   NA 136 11/08/2019   K 4.1 11/08/2019   CL 103 11/08/2019   CO2 25 11/08/2019    Anesthesia Physical Anesthesia Plan  ASA: III  Anesthesia Plan: General   Post-op Pain Management:  Regional for Post-op pain   Induction: Intravenous  PONV Risk Score and Plan: 2 and Dexamethasone, Ondansetron and Treatment may vary due to age or medical condition  Airway Management Planned: Oral ETT  Additional Equipment:   Intra-op Plan:   Post-operative Plan: Extubation in OR  Informed Consent: I have reviewed the patients History and Physical, chart, labs and discussed the procedure including the risks, benefits and alternatives for the proposed anesthesia with the patient or authorized representative who has indicated his/her understanding  and acceptance.     Dental advisory given  Plan Discussed with: CRNA  Anesthesia Plan Comments: (PAT note written by Myra Gianotti, PA-C. Prolonged hospitalization following CABG/TAA repair on 02/22/19 (including LTAC stay ~ 06/2018), multiple reintubations and diagnosed with diaphragmatic paralysis (s/p tracheostomy ~ 05/2019-07/2019). Only on ASA for afib due to cerebral amyloid angiopathy. Pulmonologist Dr. Michela Pitcher on 11/08/19, "Consider perioperative nebulizers". Cardiologist Dr. Debara Pickett, "...at increased risk for surgery however not necessarily risk prohibitive..." )     Anesthesia Quick Evaluation

## 2019-11-09 NOTE — Progress Notes (Signed)
Anesthesia Chart Review:  Case: [id number] Date/Time: 11/14/19 1115   Procedure: RIGHT INGUINAL HERNIA REPAIR WITH MESH (Right ) - LMA AND TAP BLOCK   Anesthesia type: General   Pre-op diagnosis: right inguinal hernia   Location: [facility] OR ROOM 08 / [place] OR   Surgeons: Donnie Mesa, MD      DISCUSSION: Patient is an 85 year old male scheduled for the above procedure. He was referred for hernia surgery by cardiologist Dr. Debara Pickett.   History includes never smoker, CAD & ascending TAA (CABG: LIMA-LAD, SVG-DX and ascending TAA repair using 32 mm Hemashield graft, STJ to ascending interposition graft with resuspension of AV 02/22/2019, Dr. 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   He had a prolonged hospitalization at [facility] followed by [facility] stay in [place], [place]. Based on records reviewed in [place], he was initially hospitalized 02/22/2019 (first discharge 03/14/2019) at [facility] for CABG (LIMA-LAD, SVG-DX) and ascending TAA repair (32 mm Hemashield graft, STJ to ascending interposition graft with resuspension of AV). He had post-operative afib and a brain attack 02/23/2019 (blurred vision/diplopia, paresthesia right index finger). CT showed age related changed. TTE negative for PFO. He did have CO2 embolism from vein harvest noted during operative procedure due to injury at the junction of the greater saphenous vein to common femoral vein. MRI not done due to improvement in symptoms. He also became hypercarbic on 03/06/2019 and required re-intubation due  tosomnolence and decreased respiratory drive. He was extubated 03/07/2019.  Fluoroscopic sniff test was negative for hemidiaphragmatic paralysis, although chest ultrasound showed relative decreased mobility of right hemidiaphragm compared to left. 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 On 03/18/2019 he had another brain attack with AMS, dysarthria, left sided weakness and was intubated for airway protection. CT head and CTA head/neck did not reveal any acute abnormalities, but MRI brain showed left fontal acute/subacute infarct, as well as multiple microhemorrhages leading to concerns for cerebral amyloid angiopathy (CAA). He also had BLE DVTs during his admission. Neurology was consulted and advised that typically for someone with CAA and DVT, they would strongly recommend against anticoagulation as both subtypes of CAA are likely to bleed (with one likely to bleed even on baby aspirin). He was extubated 03/19/2019. On 03/22/2019, he was found to be obtunded and another brain attack was called and was reintubated for airway protection.  Nonobstructive ileus thought to be contributing and was medically managed. He was extubated again on 03/26/2019 to BiPAP.  On 03/29/2019 he again developed worsening hypercarbia and altered mental status requiring intubation.  A subsequent 24-hour EEG showed mild diffuse cerebral disturbances but no seizures.  He self extubated 04/01/2019.  Due to recurrent intubations, further work-up included repeat sniff test 04/10/2019 which showed right hemidiaphragm paralysis and left hemidiaphragm with greatly diminished range suggesting paresis.  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  He was not a candidate for diaphragmatic pacer due to low amplitudes.  Per neurology recommendations, lumbar puncture was performed on  04/20/2019, and there was no evidence of Ethelene Hal syndrome on the CSF studies.  He was BiPAP dependent only at night until 04/30/2019, but became progressively more BiPAP dependent, even unable to come off for a few minutes to take medications.  On 05/04/2019, he required reintubation again.  The underlying cause of his recurrent hypercarbic respiratory failure was thought to be diaphragmatic paralysis secondary to his prior cardiac surgery.  At this point decision made to pursue tracheostomy and PEG placement which were done on 05/15/2019. He was transferred to [facility] for further medical management on 06/04/2019 where he had VDRF where he was ultimately weaned from the ventilator, and discharged in late March 2021. While at [facility] he was evaluated by cardiologist Dixie Dials, MD and started on digoxin for RV dysfunction (seen on 04/19/2019 echo).   Since his prolonged hospitalization, he and his wife have moved from [place] to [place]. He became established with Willeen Niece, [place] on 07/26/2019. By that time he was eating by mouth and requesting PEG removal. She referred him to pulmonology, cardiology, neurology, and general surgery. PEG tube was removed on 08/02/2019. 08/14/2019 pulmonology notes indicate tracheostomy was decannulated approximately 1 month prior.  He was not requiring home oxygen.  Seen by pulmonologist Dr. Michela Pitcher on 11/08/2019: "Mister Cotham denies any active shortness of breath. He does continue to have moderate obstruction and would benefit from a daily inhaler but at this time he declines. He is planning hernia surgery next week. Plan   Consider perioperative nebulizers  Follow-up lung nodule next year  Follow up in the office next year or sooner if needed  Consider albuterol as needed, he declines at this time".  Seen by neurologist Dr. Lynnette Caffey on 08/15/2019. Given cerebral amyloid angiopathy, he wrote: "Independently would be candidate for anticoagulation therapy, but this would  be contraindicated in patients with cerebral amyloid angiopathy..  Would not escalate anticoagulation therapy given risk of intracerebral hemorrhage  Even with antiplatelet therapy there is risk, but would continue with aspirin 81 for now". Adolm Joseph, [place] Cerise, PA called and reviewed these instructions with cardiologist Dr. Debara Pickett who on 10/19/19 wrote, "...he [Dr. Morris] is concerned about the risk of intracerebral hemorrhage on anticoagulation due to cerebral amyloid angiopathy. He is not recommending anticoagulation, despite the patient's afib and increased risk of embolic stroke. Will follow his recommendations. He is okay with low dose aspirin, however, from an afib standpoint that is not likely to be effective anticoagulation against stroke."  Per cardiologist Dr. Debara Pickett at 09/19/2019 office visit, "Finally he has been struggling with an inguinal hernia on the right and wishes to have a repair with Dr. Tally Due.  I would certainly consider him at an increased risk for surgery however not necessarily risk prohibitive.  Although we are planning on starting anticoagulation today, if he should proceed with that he would need to hold anticoagulation for 2 days prior to the procedure and restart as soon as practical afterwards." Since then Eliquis has been discontinued to to CAA, and surgeon is now Dr. Georgette Dover.    Presurgical COVID-19 test is scheduled for 11/10/2019.  Anesthesia team to evaluate on the day of surgery.    VS: BP (!) 97/64   Pulse 80   Temp 36.7 C (Oral)   Resp 18   Ht 5\' 7"  (1.702 m)   Wt 68.7 kg   SpO2 98%   BMI 23.73 kg/m    PROVIDERS: Willeen Niece, PA is PCP ([place]) Lyman Bishop, MD is cardiologist. Established care on 09/19/19.  He discontinued digoxin.  He decreased metoprolol from 3  times daily to twice daily due to dizziness/hypotension.  He plans to see him in 3 months with repeat echocardiogram.  He felt patient was "increased risk for surgery  however not necessarily risk prohibitive." Dionne Milo, MD is pulmonologist ([facility]) Cammy Brochure, MD is neurologist ([place])   LABS: Labs reviewed: Acceptable for surgery. (all labs ordered are listed, but only abnormal results are displayed)  Labs Reviewed  COMPREHENSIVE METABOLIC PANEL - Abnormal; Notable for the following components:      Result Value   Alkaline Phosphatase 131 (*)    Total Bilirubin 1.3 (*)    All other components within normal limits  CBC - Abnormal; Notable for the following components:   RBC 3.86 (*)    Hemoglobin 11.2 (*)    HCT 37.0 (*)    RDW 17.4 (*)    All other components within normal limits    OTHER: Respiratory Flow Volume Loop 11/08/2019 ([place]): Impression: Moderate obstruction.  Spirometry 08/14/2019 ([facility]): Impression: There is a moderate fixed obstructive ventilatory defect. The TLC is mildly reduced. The diffusing capacity is moderately reduced. There is no significant response to bronchodilators.   Summary of [facility] workup: "PFTs 02/2019: moderate restrictive ventilatory defect (TLC 64% pred), severe gas transfer defect (DLC 45% pred) and an acute response to bronchodilators.  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 LP (04/20/19) no evidence of Guillain-Barr syndrome on his CSF studies."  Continuous EEG with video 04/19/2019 [facility]): Summary  This is a mildly abnormal study due to:  - mild diffuse slowing of the awake background  Comment:  This record was indicative of a mild diffuse cerebral disturbance There  were no electrographic seizures.     IMAGES: CT Chest 08/17/2019 ([facility]): IMPRESSION:  1. Small bilateral pleural effusions with chronic  appearing adjacent subsegmental atelectasis and mild bronchiectasis. No consolidative pneumonia.  2. Aneurysmal ascending thoracic aorta that appears to be post surgical repair, dense calcified coronary disease.  3. An 8 x 5 mm groundglass nodule in the left upper lobe. If there are no outside CT images to establish long-term stability, see the following recommendations:  - Ground glass pulmonary nodule of greater than 73mm. Recommend 3 month follow up CT to confirm persistence, then yearly CT for a minimum of 3 years. [employer] Society. Radiology. January 2013)   CTA head/neck 11/02/2018 ([facility]): IMPRESSION:  1. No evidence of significant internal carotid artery stenosis.  2. Evidence of chronic microvascular ischemic changes within the brain.  3. Multilevel cervical spinal degenerative changes, as above..    EKG: 09/19/2019: Afib at 70 bpm Non-specific T wave abnormality   CV: Echo 04/19/2019 [facility]) CONCLUSIONS  Normal left ventricular cavity size. Proximal septal hypertrophy.   Normal global left ventricular systolic function. EF estimated at 65-  70%. Normal regional wall motion. Indeterminate diastolic function  Normal right ventricular size. Increased right ventricular wall   thickness. Mild-moderate global hypokinesis of the right ventricle.   Mild to moderately decreased right ventricular systolic function.   Aortic root dilated to 4.1 cm, ascending aorta dilated to 4.6 cm  Pleural effusion present.   Cardiac cath 02/15/2019 [facility], pre-CABG/TAA Repair): 1. The coronary circulation is right dominant. All coronary arteries are  of normal caliber for age.  2. The left main is  short and free of focal disease.  3. The LAD is a relatively short vessel that does not wrap the apex. The  LAD supplies a very large proximal diagonal branch. The diagonal branch  has 95% stenosis at its origin and appears graftable. The proximal LAD has  irregular  40-50% stenosis. The mid-LAD beyond the LAD diagonal has a 3 cm  long region of diffuse 50-70% stenosis. The LAD thereafter appears  graftable.  4. The LCx supplies two large marginal branches. The LCx system is free of  focal disease.  5. The RCA supplies a large hyperdominant PDA and two large posterolateral  branches. The RCA system is free of focal disease.  6. Extreme brachial artery loop in the upper arm, traversed with a  prolapsed Magic wire.   Past Medical History:  Diagnosis Date  . Acute on chronic respiratory failure with hypoxia ([place])   . BPH (benign prostatic hyperplasia)   . CAD (coronary artery disease)   . Cancer ([place])    Melonoma on left shoulder blade removed  . Cerebral amyloid angiopathy ([place])   . Chronic atrial fibrillation ([place])   . Chronic congestive heart failure with left ventricular diastolic dysfunction ([place])   . Diaphragmatic paralysis   . Dyspnea   . Dysrhythmia   . ED (erectile dysfunction)   . Hepatitis    Hep A  . History of DVT of lower extremity    BLE DVT ~ 04/2019  . Pneumonia   . Stroke (cerebrum) ([place]) 03/2019   STROKE X 2    Past Surgical History:  Procedure Laterality Date  . ASCENDING AORTIC ANEURYSM REPAIR  02/22/2019   32 mm Hemashild graft, STJ to ascending interposition graft, resuspension of AV [facility])  . BACK SURGERY    . CARDIAC CATHETERIZATION  02/15/2019   [facility]  . CORONARY ARTERY BYPASS GRAFT  02/22/2019   LIMA to LAD, SVG to D1 [facility])  . EYE SURGERY Bilateral    Cataract surgery  . JOINT REPLACEMENT Bilateral    knee replacement  . L3-L4 Back surgery    . PEG TUBE PLACEMENT    . sciatic nerve cyst removal    . TONSILLECTOMY    . TRACHEOSTOMY      MEDICATIONS: . acetaminophen (TYLENOL) 500 MG tablet  . apixaban (ELIQUIS) 5 MG TABS tablet  . Ascorbic Acid (VITAMIN C) 500 MG CAPS  . aspirin EC 81 MG tablet  . atorvastatin (LIPITOR) 80 MG tablet  . diclofenac Sodium  (VOLTAREN) 1 % GEL  . DUREZOL 0.05 % EMUL  . furosemide (LASIX) 20 MG tablet  . metoprolol tartrate (LOPRESSOR) 25 MG tablet  . Multiple Vitamins-Minerals (MULTIVITAMIN ADULT EXTRA C PO)  . Multiple Vitamins-Minerals (OCUVITE ADULT FORMULA PO)  . tamsulosin (FLOMAX) 0.4 MG CAPS capsule   No current facility-administered medications for this encounter.   - He is no longer on Eliquis.   Myra Gianotti, PA-C Surgical Short Stay/Anesthesiology [facility] Phone [phone] [facility] Phone [phone] 11/09/2019 8:29 PM

## 2019-11-10 ENCOUNTER — Other Ambulatory Visit (HOSPITAL_COMMUNITY)
Admission: RE | Admit: 2019-11-10 | Discharge: 2019-11-10 | Disposition: A | Discharge status: Home / Self Care | Payer: Medicare Other | Source: Ambulatory Visit | Attending: Surgery | Admitting: Surgery

## 2019-11-10 DIAGNOSIS — Z20822 Contact with and (suspected) exposure to covid-19: Secondary | ICD-10-CM | POA: Insufficient documentation

## 2019-11-10 DIAGNOSIS — Z01812 Encounter for preprocedural laboratory examination: Secondary | ICD-10-CM | POA: Insufficient documentation

## 2019-11-10 LAB — SARS CORONAVIRUS 2 (TAT 6-24 HRS): SARS Coronavirus 2: NEGATIVE

## 2019-11-14 ENCOUNTER — Observation Stay (HOSPITAL_COMMUNITY)
Admission: RE | Admit: 2019-11-14 | Discharge: 2019-11-14 | Disposition: A | Discharge status: Home / Self Care | Payer: Medicare Other | Attending: Surgery | Admitting: Surgery

## 2019-11-14 ENCOUNTER — Other Ambulatory Visit: Payer: Self-pay

## 2019-11-14 ENCOUNTER — Encounter (HOSPITAL_COMMUNITY): Payer: Self-pay | Admitting: Surgery

## 2019-11-14 ENCOUNTER — Encounter (HOSPITAL_COMMUNITY)
Admission: RE | Disposition: A | Discharge status: Home / Self Care | Payer: Self-pay | Source: Home / Self Care | Attending: Surgery

## 2019-11-14 ENCOUNTER — Ambulatory Visit (HOSPITAL_COMMUNITY): Payer: Medicare Other | Admitting: Vascular Surgery

## 2019-11-14 ENCOUNTER — Ambulatory Visit (HOSPITAL_COMMUNITY): Payer: Medicare Other | Admitting: Anesthesiology

## 2019-11-14 DIAGNOSIS — Z96651 Presence of right artificial knee joint: Secondary | ICD-10-CM | POA: Diagnosis not present

## 2019-11-14 DIAGNOSIS — K4090 Unilateral inguinal hernia, without obstruction or gangrene, not specified as recurrent: Secondary | ICD-10-CM | POA: Diagnosis present

## 2019-11-14 DIAGNOSIS — Z951 Presence of aortocoronary bypass graft: Secondary | ICD-10-CM | POA: Insufficient documentation

## 2019-11-14 HISTORY — PX: INGUINAL HERNIA REPAIR: SHX194

## 2019-11-14 SURGERY — REPAIR, HERNIA, INGUINAL, ADULT
Anesthesia: General | Site: Abdomen | Laterality: Right

## 2019-11-14 MED ORDER — ONDANSETRON HCL 4 MG/2ML IJ SOLN
INTRAMUSCULAR | Status: DC | PRN
  Administered 2019-11-14: 4 mg via INTRAVENOUS

## 2019-11-14 MED ORDER — LIDOCAINE 2% (20 MG/ML) 5 ML SYRINGE
INTRAMUSCULAR | Status: AC
  Filled 2019-11-14: qty 5

## 2019-11-14 MED ORDER — ROCURONIUM BROMIDE 10 MG/ML (PF) SYRINGE
PREFILLED_SYRINGE | INTRAVENOUS | Status: AC
  Filled 2019-11-14: qty 10

## 2019-11-14 MED ORDER — 0.9 % SODIUM CHLORIDE (POUR BTL) OPTIME
TOPICAL | Status: DC | PRN
  Administered 2019-11-14: 1000 mL

## 2019-11-14 MED ORDER — PROPOFOL 10 MG/ML IV BOLUS
INTRAVENOUS | Status: DC | PRN
  Administered 2019-11-14: 120 mg via INTRAVENOUS

## 2019-11-14 MED ORDER — MIDAZOLAM HCL 2 MG/2ML IJ SOLN
INTRAMUSCULAR | Status: AC
  Administered 2019-11-14: 1 mg
  Filled 2019-11-14: qty 2

## 2019-11-14 MED ORDER — AMISULPRIDE (ANTIEMETIC) 5 MG/2ML IV SOLN: 10.0000 mg | Freq: Once | INTRAVENOUS | Status: DC | PRN

## 2019-11-14 MED ORDER — CHLORHEXIDINE GLUCONATE 0.12 % MT SOLN: 15.0000 mL | Freq: Once | OROMUCOSAL | Status: AC

## 2019-11-14 MED ORDER — ROCURONIUM BROMIDE 100 MG/10ML IV SOLN
INTRAVENOUS | Status: DC | PRN
  Administered 2019-11-14: 60 mg via INTRAVENOUS

## 2019-11-14 MED ORDER — CEFAZOLIN SODIUM-DEXTROSE 2-4 GM/100ML-% IV SOLN
2.0000 g | INTRAVENOUS | Status: AC
  Administered 2019-11-14: 2 g via INTRAVENOUS

## 2019-11-14 MED ORDER — DEXAMETHASONE SODIUM PHOSPHATE 10 MG/ML IJ SOLN
INTRAMUSCULAR | Status: DC | PRN
  Administered 2019-11-14: 5 mg via INTRAVENOUS

## 2019-11-14 MED ORDER — LIDOCAINE 2% (20 MG/ML) 5 ML SYRINGE
INTRAMUSCULAR | Status: DC | PRN
  Administered 2019-11-14: 60 mg via INTRAVENOUS

## 2019-11-14 MED ORDER — EPHEDRINE SULFATE-NACL 50-0.9 MG/10ML-% IV SOSY
PREFILLED_SYRINGE | INTRAVENOUS | Status: DC | PRN
  Administered 2019-11-14: 10 mg via INTRAVENOUS
  Administered 2019-11-14 (×2): 5 mg via INTRAVENOUS

## 2019-11-14 MED ORDER — ACETAMINOPHEN 500 MG PO TABS
ORAL_TABLET | ORAL | Status: AC
  Administered 2019-11-14: 1000 mg via ORAL
  Filled 2019-11-14: qty 2

## 2019-11-14 MED ORDER — FENTANYL CITRATE (PF) 100 MCG/2ML IJ SOLN
INTRAMUSCULAR | Status: AC
  Administered 2019-11-14: 50 ug via INTRAVENOUS
  Filled 2019-11-14: qty 2

## 2019-11-14 MED ORDER — CEFAZOLIN SODIUM-DEXTROSE 2-4 GM/100ML-% IV SOLN
INTRAVENOUS | Status: AC
  Filled 2019-11-14: qty 100

## 2019-11-14 MED ORDER — CHLORHEXIDINE GLUCONATE CLOTH 2 % EX PADS: 6.0000 | MEDICATED_PAD | Freq: Once | CUTANEOUS | Status: DC

## 2019-11-14 MED ORDER — FENTANYL CITRATE (PF) 250 MCG/5ML IJ SOLN
INTRAMUSCULAR | Status: AC
  Filled 2019-11-14: qty 5

## 2019-11-14 MED ORDER — ORAL CARE MOUTH RINSE: 15.0000 mL | Freq: Once | OROMUCOSAL | Status: AC

## 2019-11-14 MED ORDER — STERILE WATER FOR IRRIGATION IR SOLN
Status: DC | PRN
  Administered 2019-11-14: 500 mL

## 2019-11-14 MED ORDER — BUPIVACAINE-EPINEPHRINE 0.25% -1:200000 IJ SOLN: INTRAMUSCULAR | Status: DC | PRN

## 2019-11-14 MED ORDER — SUGAMMADEX SODIUM 200 MG/2ML IV SOLN
INTRAVENOUS | Status: DC | PRN
Start: 2019-11-14 — End: 2019-11-14
  Administered 2019-11-14: 150 mg via INTRAVENOUS

## 2019-11-14 MED ORDER — FENTANYL CITRATE (PF) 100 MCG/2ML IJ SOLN: 50.0000 ug | Freq: Once | INTRAMUSCULAR | Status: AC

## 2019-11-14 MED ORDER — PROPOFOL 10 MG/ML IV BOLUS
INTRAVENOUS | Status: AC
  Filled 2019-11-14: qty 20

## 2019-11-14 MED ORDER — FENTANYL CITRATE (PF) 100 MCG/2ML IJ SOLN: 25.0000 ug | INTRAMUSCULAR | Status: DC | PRN

## 2019-11-14 MED ORDER — MIDAZOLAM HCL 2 MG/2ML IJ SOLN: 0.5000 mg | Freq: Once | INTRAMUSCULAR | Status: DC

## 2019-11-14 MED ORDER — BUPIVACAINE HCL (PF) 0.25 % IJ SOLN
INTRAMUSCULAR | Status: AC
  Filled 2019-11-14: qty 30

## 2019-11-14 MED ORDER — GABAPENTIN 300 MG PO CAPS
ORAL_CAPSULE | ORAL | Status: AC
  Administered 2019-11-14: 300 mg via ORAL
  Filled 2019-11-14: qty 1

## 2019-11-14 MED ORDER — ACETAMINOPHEN 500 MG PO TABS: 1000.0000 mg | ORAL_TABLET | ORAL | Status: AC

## 2019-11-14 MED ORDER — ONDANSETRON HCL 4 MG/2ML IJ SOLN
INTRAMUSCULAR | Status: AC
  Filled 2019-11-14: qty 2

## 2019-11-14 MED ORDER — TRAMADOL HCL 50 MG PO TABS: 50.0000 mg | ORAL_TABLET | Freq: Four times a day (QID) | ORAL | 0 refills | Status: DC | PRN

## 2019-11-14 MED ORDER — PHENYLEPHRINE 40 MCG/ML (10ML) SYRINGE FOR IV PUSH (FOR BLOOD PRESSURE SUPPORT)
PREFILLED_SYRINGE | INTRAVENOUS | Status: DC | PRN
  Administered 2019-11-14: 120 ug via INTRAVENOUS
  Administered 2019-11-14 (×3): 80 ug via INTRAVENOUS
  Administered 2019-11-14: 40 ug via INTRAVENOUS

## 2019-11-14 MED ORDER — DEXAMETHASONE SODIUM PHOSPHATE 10 MG/ML IJ SOLN
INTRAMUSCULAR | Status: AC
  Filled 2019-11-14: qty 1

## 2019-11-14 MED ORDER — GABAPENTIN 300 MG PO CAPS: 300.0000 mg | ORAL_CAPSULE | ORAL | Status: AC

## 2019-11-14 MED ORDER — LACTATED RINGERS IV SOLN: INTRAVENOUS | Status: DC

## 2019-11-14 MED ORDER — CHLORHEXIDINE GLUCONATE 0.12 % MT SOLN
OROMUCOSAL | Status: AC
  Administered 2019-11-14: 15 mL via OROMUCOSAL
  Filled 2019-11-14: qty 15

## 2019-11-14 MED ORDER — BUPIVACAINE HCL 0.25 % IJ SOLN
INTRAMUSCULAR | Status: DC | PRN
  Administered 2019-11-14: 27 mL

## 2019-11-14 SURGICAL SUPPLY — 42 items
BENZOIN TINCTURE PRP APPL 2/3 (GAUZE/BANDAGES/DRESSINGS) ×2 IMPLANT
BLADE CLIPPER SURG (BLADE) ×2 IMPLANT
CHLORAPREP W/TINT 26 (MISCELLANEOUS) ×2 IMPLANT
COVER SURGICAL LIGHT HANDLE (MISCELLANEOUS) ×2 IMPLANT
COVER WAND RF STERILE (DRAPES) ×2 IMPLANT
DRAIN PENROSE 1/2X12 LTX STRL (WOUND CARE) ×2 IMPLANT
DRAPE LAPAROSCOPIC ABDOMINAL (DRAPES) IMPLANT
DRAPE LAPAROTOMY TRNSV 102X78 (DRAPES) ×2 IMPLANT
DRSG TEGADERM 4X4.75 (GAUZE/BANDAGES/DRESSINGS) ×2 IMPLANT
ELECT CAUTERY BLADE 6.4 (BLADE) IMPLANT
ELECT REM PT RETURN 9FT ADLT (ELECTROSURGICAL) ×2
ELECTRODE REM PT RTRN 9FT ADLT (ELECTROSURGICAL) ×1 IMPLANT
GAUZE 4X4 16PLY RFD (DISPOSABLE) ×2 IMPLANT
GAUZE SPONGE 4X4 12PLY STRL (GAUZE/BANDAGES/DRESSINGS) ×2 IMPLANT
GLOVE BIO SURGEON STRL SZ7 (GLOVE) ×2 IMPLANT
GLOVE BIOGEL PI IND STRL 7.5 (GLOVE) ×1 IMPLANT
GLOVE BIOGEL PI INDICATOR 7.5 (GLOVE) ×1
GOWN STRL REUS W/ TWL LRG LVL3 (GOWN DISPOSABLE) ×2 IMPLANT
GOWN STRL REUS W/TWL LRG LVL3 (GOWN DISPOSABLE) ×2
KIT BASIN OR (CUSTOM PROCEDURE TRAY) ×2 IMPLANT
KIT TURNOVER KIT B (KITS) ×2 IMPLANT
MESH PARIETEX PROGRIP RIGHT (Mesh General) ×2 IMPLANT
NEEDLE HYPO 25GX1X1/2 BEV (NEEDLE) ×2 IMPLANT
NS IRRIG 1000ML POUR BTL (IV SOLUTION) ×2 IMPLANT
PACK GENERAL/GYN (CUSTOM PROCEDURE TRAY) ×2 IMPLANT
PAD ARMBOARD 7.5X6 YLW CONV (MISCELLANEOUS) ×2 IMPLANT
PENCIL SMOKE EVACUATOR (MISCELLANEOUS) ×2 IMPLANT
SPONGE INTESTINAL PEANUT (DISPOSABLE) ×2 IMPLANT
STRIP CLOSURE SKIN 1/2X4 (GAUZE/BANDAGES/DRESSINGS) ×2 IMPLANT
SUT MNCRL AB 4-0 PS2 18 (SUTURE) ×2 IMPLANT
SUT PDS AB 0 CT 36 (SUTURE) IMPLANT
SUT SILK 2 0 SH (SUTURE) IMPLANT
SUT SILK 3 0 (SUTURE)
SUT SILK 3-0 18XBRD TIE 12 (SUTURE) IMPLANT
SUT VIC AB 0 CT2 27 (SUTURE) ×2 IMPLANT
SUT VIC AB 2-0 SH 27 (SUTURE) ×1
SUT VIC AB 2-0 SH 27X BRD (SUTURE) ×1 IMPLANT
SUT VIC AB 3-0 SH 27 (SUTURE) ×1
SUT VIC AB 3-0 SH 27XBRD (SUTURE) ×1 IMPLANT
SYR CONTROL 10ML LL (SYRINGE) ×2 IMPLANT
TOWEL GREEN STERILE (TOWEL DISPOSABLE) ×2 IMPLANT
TOWEL GREEN STERILE FF (TOWEL DISPOSABLE) ×2 IMPLANT

## 2019-11-14 NOTE — Op Note (Signed)
Hernia, Open, Procedure Note  Indications: The patient presented with a history of a right, reducible inguinal hernia.    Pre-operative Diagnosis: right reducible inguinal hernia Post-operative Diagnosis: same  Surgeon: Maia Petties   Assistants: Carlena Hurl, PA-C  Anesthesia: General endotracheal anesthesia  ASA Class: 2  Procedure Details  The patient was seen again in the Holding Room. The risks, benefits, complications, treatment options, and expected outcomes were discussed with the patient. The possibilities of reaction to medication, pulmonary aspiration, perforation of viscus, bleeding, recurrent infection, the need for additional procedures, and development of a complication requiring transfusion or further operation were discussed with the patient and/or family. The likelihood of success in repairing the hernia and returning the patient to their previous functional status is good.  There was concurrence with the proposed plan, and informed consent was obtained. The site of surgery was properly noted/marked. The patient was taken to the Operating Room, identified as Travis Brewer, and the procedure verified as right inguinal hernia repair. A Time Out was held and the above information confirmed.  The patient was placed in the supine position and underwent induction of anesthesia. The lower abdomen and groin was prepped with Chloraprep and draped in the standard fashion, and 0.25% Marcaine with epinephrine was used to anesthetize the skin over the mid-portion of the inguinal canal. An oblique incision was made. Dissection was carried down through the subcutaneous tissue with cautery to the external oblique fascia.  We opened the external oblique fascia along the direction of its fibers to the external ring.  The spermatic cord was circumferentially dissected bluntly and retracted with a Penrose drain.  The ilioinguinal nerve was identified and preserved.  The floor of the inguinal canal  was inspected and was lax with no sign of direct defect.  We skeletonized the spermatic cord and reduced a moderately large indirect hernia sac and cord lipoma.  The internal ring was tightened with 0 Vicryl.  We used a right Progrip mesh which was inserted and deployed across the floor of the inguinal canal. The mesh was tucked underneath the external oblique fascia laterally.  The flap of the mesh was closed around the spermatic cord to recreate the internal inguinal ring.  The mesh was secured to the pubic tubercle with 0 Vicryl.  Additionally sutures are placed in the lower edge of the mesh and to close the mesh flap. The external oblique fascia was reapproximated with 2-0 Vicryl.  3-0 Vicryl was used to close the subcutaneous tissues and 4-0 Monocryl was used to close the skin in subcuticular fashion.  Benzoin and steri-strips were used to seal the incision.  A clean dressing was applied.  The patient was then extubated and brought to the recovery room in stable condition.  All sponge, instrument, and needle counts were correct prior to closure and at the conclusion of the case.   Estimated Blood Loss: Minimal                 Complications: None; patient tolerated the procedure well.         Disposition: PACU - hemodynamically stable.         Condition: stable  Imogene Burn. Georgette Dover, MD, [facility]  General/ Trauma Surgery   11/14/2019 12:17 PM

## 2019-11-14 NOTE — Transfer of Care (Signed)
Immediate Anesthesia Transfer of Care Note  Patient: Travis Brewer  Procedure(s) Performed: RIGHT INGUINAL HERNIA REPAIR WITH MESH (Right Abdomen)  Patient Location: PACU  Anesthesia Type:General  Level of Consciousness: awake, drowsy and patient cooperative  Airway & Oxygen Therapy: Patient Spontanous Breathing and Patient connected to nasal cannula oxygen  Post-op Assessment: Report given to RN and Post -op Vital signs reviewed and stable  Post vital signs: Reviewed and stable  Last Vitals:  Vitals Value Taken Time  BP 115/70 11/14/19 1228  Temp    Pulse 64 11/14/19 1231  Resp 22 11/14/19 1231  SpO2 98 % 11/14/19 1231  Vitals shown include unvalidated device data.  Last Pain:  Vitals:   11/14/19 1002  PainSc: 0-No pain      Patients Stated Pain Goal: 2 (95/32/02 3343)  Complications: No complications documented.

## 2019-11-14 NOTE — Discharge Instructions (Signed)
[facility] _______[facility] ° °UMBILICAL OR INGUINAL HERNIA REPAIR: POST OP INSTRUCTIONS ° °Always review your discharge instruction sheet given to you by the facility where your surgery was performed. °IF YOU HAVE DISABILITY OR FAMILY LEAVE FORMS, YOU MUST BRING THEM TO THE OFFICE FOR PROCESSING.   °DO NOT GIVE THEM TO YOUR DOCTOR. ° °1. A  prescription for pain medication may be given to you upon discharge.  Take your pain medication as prescribed, if needed.  If narcotic pain medicine is not needed, then you may take acetaminophen (Tylenol) or ibuprofen (Advil) as needed. °2. Take your usually prescribed medications unless otherwise directed. °If you need a refill on your pain medication, please contact your pharmacy.  They will contact our office to request authorization. Prescriptions will not be filled after 5 pm or on week-ends. °3. You should follow a light diet the first 24 hours after arrival home, such as soup and crackers, etc.  Be sure to include lots of fluids daily.  Resume your normal diet the day after surgery. °4.Most patients will experience some swelling and bruising around the umbilicus or in the groin and scrotum.  Ice packs and reclining will help.  Swelling and bruising can take several days to resolve.  °6. It is common to experience some constipation if taking pain medication after surgery.  Increasing fluid intake and taking a stool softener (such as Colace) will usually help or prevent this problem from occurring.  A mild laxative (Milk of Magnesia or Miralax) should be taken according to package directions if there are no bowel movements after 48 hours. °7. Unless discharge instructions indicate otherwise, you may remove your bandages 24-48 hours after surgery, and you may shower at that time.  You may have steri-strips (small skin tapes) in place directly over the incision.  These strips should be left on the skin for 7-10 days.  If your surgeon used skin glue on the  incision, you may shower in 24 hours.  The glue will flake off over the next 2-3 weeks.  Any sutures or staples will be removed at the office during your follow-up visit. °8. ACTIVITIES:  You may resume regular (light) daily activities beginning the next day--such as daily self-care, walking, climbing stairs--gradually increasing activities as tolerated.  You may have sexual intercourse when it is comfortable.  Refrain from any heavy lifting or straining until approved by your doctor. ° °a.You may drive when you are no longer taking prescription pain medication, you can comfortably wear a seatbelt, and you can safely maneuver your car and apply brakes. °b.RETURN TO WORK:   °_____________________________________________ ° °9.You should see your doctor in the office for a follow-up appointment approximately 2-3 weeks after your surgery.  Make sure that you call for this appointment within a day or two after you arrive home to insure a convenient appointment time. °10.OTHER INSTRUCTIONS: _________________________ °   _____________________________________ ° °WHEN TO CALL YOUR DOCTOR: °1. Fever over 101.0 °2. Inability to urinate °3. Nausea and/or vomiting °4. Extreme swelling or bruising °5. Continued bleeding from incision. °6. Increased pain, redness, or drainage from the incision ° °The clinic staff is available to answer your questions during regular business hours.  Please don’t hesitate to call and ask to speak to one of the nurses for clinical concerns.  If you have a medical emergency, go to the nearest emergency room or call 911.  A surgeon from [facility] is always on call at the hospital ° ° °  [address], [address], [place], [place]  [address] ? ° [address] [address], [place], [place]   [address] °[phone] ? [phone] ? FAX [phone] °Web site: [email or web address] °

## 2019-11-14 NOTE — Anesthesia Procedure Notes (Signed)
Procedure Name: Intubation Date/Time: 11/14/2019 11:20 AM Performed by: Suzette Battiest, MD Pre-anesthesia Checklist: Patient identified, Emergency Drugs available, Suction available and Patient being monitored Patient Re-evaluated:Patient Re-evaluated prior to induction Oxygen Delivery Method: Circle system utilized Preoxygenation: Pre-oxygenation with 100% oxygen Induction Type: IV induction Ventilation: Oral airway inserted - appropriate to patient size and Two handed mask ventilation required Laryngoscope Size: [place] and 2 Grade View: Grade I Tube type: Reinforced Tube size: 5.5 mm Number of attempts: 2 Airway Equipment and Method: Stylet and Oral airway Placement Confirmation: ETT inserted through vocal cords under direct vision,  positive ETCO2 and breath sounds checked- equal and bilateral Secured at: 21 cm Tube secured with: Tape Dental Injury: Teeth and Oropharynx as per pre-operative assessment

## 2019-11-14 NOTE — H&P (Signed)
History of Present Illness  The patient is a 85 year old male who presents with an inguinal hernia.   Referred by Dr. Mali Hilty for [facility]  This is an 85 year old male who recently moved to [place] from [place]. In November 2020, he underwent CABG 2, aortic aneurysm replacement, and resuspension of aortic valve. This was performed at [facility]. He had significant complications associated with the surgery including intraoperative cardiac arrest, atrial fibrillation, 2 strokes, prolonged vent dependence, trach and PEG. He is anticoagulated on Eliquis. He continues to work with home health physical therapy and still requires the use of a walker. He is scheduled to have cataract surgery next week. Prior to his heart surgery, he was quite active.   Past Surgical History  Aneurysm Repair Coronary Artery Bypass Graft Knee Surgery Bilateral.  Diagnostic Studies History Colonoscopy 5-10 years ago  Allergies  No Known Drug Allergies  Medication History  Eliquis (5MG  Tablet, Oral) Active. Atorvastatin Calcium (80MG  Tablet, Oral) Active. Furosemide (20MG  Tablet, Oral) Active. Durezol (0.05% Emulsion, Ophthalmic) Active. Pantoprazole Sodium (40MG  Tablet DR, Oral) Active. Tamsulosin HCl (0.4MG  Capsule, Oral) Active. Metoprolol Tartrate (25MG  Tablet, Oral) Active.  Social History  Alcohol use Moderate alcohol use. Tobacco use Never smoker.  Family History Diabetes Mellitus Brother. Heart Disease Father.  Other Problems  Arthritis Back Pain Cerebrovascular Accident Enlarged Prostate Hepatitis Melanoma Umbilical Hernia Repair  Review of Systems  HEENT Present- Ringing in the Ears. Not Present- Earache, Hearing Loss, Hoarseness, Nose Bleed, Oral Ulcers, Seasonal Allergies, Sinus Pain, Sore Throat, Visual Disturbances, Wears glasses/contact lenses and Yellow Eyes. Hematology Present- Blood Thinners. Not Present- Easy Bruising, Excessive  bleeding, Gland problems, HIV and Persistent Infections.  Vitals  Weight: 152.25 lb Height: 67in Body Surface Area: 1.8 m Body Mass Index: 23.85 kg/m  Temp.: 97.54F  Pulse: 70 (Regular)  BP: 118/74(Sitting, Left Arm, Standard)        Physical Exam  The physical exam findings are as follows: Note:Constitutional: WDWN in NAD, conversant, no obvious deformities; resting comfortably Eyes: Pupils equal, round; sclera anicteric; moist conjunctiva; no lid lag HENT: Oral mucosa moist; good dentition Neck: No masses palpated, trachea midline with healed trach site; no thyromegaly Lungs: CTA bilaterally; normal respiratory effort CV: Regular rate and rhythm; no murmurs; extremities well-perfused with no edema Abd: +bowel sounds, soft, non-tender, no palpable organomegaly; no palpable hernias; healed PEG site GU: Bilateral descended testes, no testicular masses, large reducible right inguinal hernia containing bowel, no sign of left inguinal hernia Musc: Normal gait; no apparent clubbing or cyanosis in extremities Lymphatic: No palpable cervical or axillary lymphadenopathy Skin: Warm, dry; no sign of jaundice Psychiatric - alert and oriented x 4; calm mood and affect    Assessment & Plan   INGUINAL HERNIA OF RIGHT SIDE WITHOUT OBSTRUCTION OR GANGRENE (K40.90)  Current Plans Schedule for Surgery - Right inguinal hernia repair with mesh. The surgical procedure has been discussed with the patient. Potential risks, benefits, alternative treatments, and expected outcomes have been explained. All of the patient's questions at this time have been answered. The likelihood of reaching the patient's treatment goal is good. The patient understand the proposed surgical procedure and wishes to proceed. Note:The patient may proceed with his cataract surgery. We will wait about a month after that surgery until he is fully recovered before proceeding with a hernia  repair. I do not think that the patient should put off having his inguinal hernia repaired as it is fairly large and already contain some small bowel.  His risk of incarceration or strangulation is elevated. We will perform surgery at [facility] and plan to keep him at least one night due to his recent cardiac surgery history.   Imogene Burn. Georgette Dover, MD, [facility]  General/ Trauma Surgery   11/14/2019 10:18 AM

## 2019-11-15 ENCOUNTER — Encounter (HOSPITAL_COMMUNITY): Payer: Self-pay | Admitting: Surgery

## 2019-11-15 NOTE — Anesthesia Postprocedure Evaluation (Signed)
Anesthesia Post Note  Patient: Travis Brewer  Procedure(s) Performed: RIGHT INGUINAL HERNIA REPAIR WITH MESH (Right Abdomen)     Patient location during evaluation: PACU Anesthesia Type: General Level of consciousness: awake and alert Pain management: pain level controlled Vital Signs Assessment: post-procedure vital signs reviewed and stable Respiratory status: spontaneous breathing, nonlabored ventilation, respiratory function stable and patient connected to nasal cannula oxygen Cardiovascular status: blood pressure returned to baseline and stable Postop Assessment: no apparent nausea or vomiting Anesthetic complications: no   No complications documented.  Last Vitals:  Vitals:   11/14/19 1515 11/14/19 1530  BP: 105/74   Pulse: 73   Resp: 20   Temp:  36.4 C  SpO2: 95%     Last Pain:  Vitals:   11/14/19 1515  PainSc: 0-No pain                 Tiajuana Amass

## 2019-12-21 ENCOUNTER — Ambulatory Visit (HOSPITAL_COMMUNITY): Payer: Medicare Other | Attending: Cardiovascular Disease

## 2019-12-21 ENCOUNTER — Other Ambulatory Visit: Payer: Self-pay

## 2019-12-21 DIAGNOSIS — Z8679 Personal history of other diseases of the circulatory system: Secondary | ICD-10-CM | POA: Insufficient documentation

## 2019-12-21 DIAGNOSIS — I2510 Atherosclerotic heart disease of native coronary artery without angina pectoris: Secondary | ICD-10-CM | POA: Diagnosis present

## 2019-12-21 DIAGNOSIS — Z9889 Other specified postprocedural states: Secondary | ICD-10-CM | POA: Diagnosis not present

## 2019-12-21 LAB — ECHOCARDIOGRAM COMPLETE
Area-P 1/2: 4.2 cm2
MV M vel: 4.84 m/s
MV Peak grad: 93.7 mmHg
P 1/2 time: 391 msec
S' Lateral: 2.8 cm

## 2019-12-25 ENCOUNTER — Encounter: Payer: Self-pay | Admitting: Internal Medicine

## 2019-12-25 ENCOUNTER — Ambulatory Visit (INDEPENDENT_AMBULATORY_CARE_PROVIDER_SITE_OTHER): Payer: Medicare Other | Admitting: Internal Medicine

## 2019-12-25 ENCOUNTER — Other Ambulatory Visit: Payer: Self-pay

## 2019-12-25 VITALS — BP 130/78 | HR 68 | Ht 67.0 in | Wt 154.0 lb

## 2019-12-25 DIAGNOSIS — Z9889 Other specified postprocedural states: Secondary | ICD-10-CM

## 2019-12-25 DIAGNOSIS — I2510 Atherosclerotic heart disease of native coronary artery without angina pectoris: Secondary | ICD-10-CM | POA: Diagnosis not present

## 2019-12-25 DIAGNOSIS — I4811 Longstanding persistent atrial fibrillation: Secondary | ICD-10-CM

## 2019-12-25 DIAGNOSIS — Z951 Presence of aortocoronary bypass graft: Secondary | ICD-10-CM

## 2019-12-25 DIAGNOSIS — Z8679 Personal history of other diseases of the circulatory system: Secondary | ICD-10-CM

## 2019-12-25 NOTE — Progress Notes (Signed)
OFFICE NOTE  Chief Complaint:  Follow-up  Primary Care Physician: Willeen Niece, PA  HPI:  Travis Brewer is a 85 y.o. male with a past medial history significant for recent diagnosis of ascending aortic aneurysm status post urgent elective surgery for aneurysm repair and possible replacement of a bicuspid aortic valve.  He underwent surgery at [facility] on 02/22/2019, undergoing an aneurysm replacement, resuspension of the aortic valve and CABG x2 with LIMA to LAD, SVG to D1.  Surgery was complicated by cardiac arrest that occurred shortly after sternotomy thought to be related to air embolism.  Ultimately the patient was successfully placed on bypass, the groin was explored and thought to be the source of air introduction during vein harvest.  Surgery then proceeded as scheduled however postoperatively was complicated by atrial fibrillation, 2 strokes, failure to wean off of the ventilator and ultimate placement of a trach and PEG.  He was subsequently transferred to the [facility] in [place] and spent most of March 2021 there, ultimately eventually being weaned off of the ventilator and having his PEG tube reversed.  He presents today with his wife is also a new patient of mine to establish cardiology care.  He was seen in the hospital by Dr. Doylene Canard, one of the local cardiologist, who had added digoxin for aid in A. fib rate control as well as possible RV inotropic stimulation.  He noted that the patient's chads vas score was 5 however anticoagulation was not recommended.  In fact I cannot understand why the patient has not been anticoagulated as there is no history of significant contraindication to this, but he did have 2 strokes and has had what appears to be persistent atrial fibrillation since surgery.  There is no evidence of left atrial appendage closure in fact it seemed that the surgery went pretty emergently after his arrest.  Recently Travis Brewer has been  complaining of positional dizziness, especially when bending over and sitting up too quickly or turning over to one side.  Blood pressure today was noted to be low at 104/72.  EKG showed that he is in A. fib with controlled ventricular response at 70-personally reviewed.  Other than this he reports he is slowly getting back to somewhat normal activities.  He did undergo some speech therapy.  He had an echo that was performed prior to bypass which showed normal LVEF and a subsequent echo in January which showed preserved LV function.  He denies any MI during the hospitalization.  He has no chest pain or shortness of breath.  He reports fatigue but it is difficult to know whether this is related to his A. fib.  The last echo in January while showing normal LVEF did show moderate to severe biatrial enlargement, suggesting that his A. fib may be challenging to rhythm control.  12/25/2019  Travis Brewer returns today for follow-up.  He recently underwent uncomplicated mesh hernia repair by Dr. Georgette Dover which went very well.  Apparently was discharged same day.  He has done well recovering.  He is interested in getting reestablished with some physical therapy to improve his strength.  He did undergo a repeat EF 60 to 65% without regional wall motion abnormalities however there are signs of right ventricular volume overload with mildly elevated pulmonary artery systolic pressure and RVSP of 38 mmHg.  The left atrium is mild to moderately dilated and the right atrium is severely dilated.  There is mild to moderate MR and severe TR.  There is mild AI.  The aortic root is dilated at 42 mm without evidence of a sending aortic dilatation.  The ascending aorta as mentioned was grafted.  The IVC was dilated with elevated right atrial pressure 15 mmHg.  Despite these echo findings, Travis Brewer says he feels well.  He denies any shortness of breath with exertion, chest pain or associated symptoms.  He is not currently anticoagulated.  We  previously discussed going on Eliquis for anticoagulation however he is concerned about the risk for intracranial bleeding.  He remains on low-dose aspirin however this is not sufficient to protect him from stroke.  We discussed alternatives to anticoagulation including possible left atrial appendage occlusion with a watchman device, however he wished to consider that further and is not interested in anticoagulation at this time.  PMHx:  Past Medical History:  Diagnosis Date  . Acute on chronic respiratory failure with hypoxia ([place])   . BPH (benign prostatic hyperplasia)   . CAD (coronary artery disease)   . Cancer ([place])    Melonoma on left shoulder blade removed  . Cerebral amyloid angiopathy ([place])   . Chronic atrial fibrillation ([place])   . Chronic congestive heart failure with left ventricular diastolic dysfunction ([place])   . Diaphragmatic paralysis   . Dyspnea   . Dysrhythmia   . ED (erectile dysfunction)   . Hepatitis    Hep A  . History of DVT of lower extremity    BLE DVT ~ 04/2019  . Pneumonia   . Stroke (cerebrum) ([place]) 03/2019   STROKE X 2    FAMHx:  Family History  Problem Relation Age of Onset  . Diabetes Mother   . Stroke Father     SOCHx:   reports that he has never smoked. He has never used smokeless tobacco. He reports that he does not use drugs. No history on file for alcohol use.  ALLERGIES:  Allergies  Allergen Reactions  . Quetiapine Nausea And Vomiting  . Clonazepam Nausea Only    ROS: Pertinent items noted in HPI and remainder of comprehensive ROS otherwise negative.  HOME MEDS: Current Outpatient Medications on File Prior to Visit  Medication Sig Dispense Refill  . acetaminophen (TYLENOL) 500 MG tablet Take 1,000 mg by mouth every 8 (eight) hours as needed for moderate pain.    . Ascorbic Acid (VITAMIN C) 500 MG CAPS Take 500 mg by mouth daily.     Marland Kitchen aspirin 81 MG chewable tablet Chew 81 mg by mouth daily.    Marland Kitchen atorvastatin (LIPITOR) 80 MG tablet  Take 1 tablet (80 mg total) by mouth daily. 90 tablet 3  . diclofenac Sodium (VOLTAREN) 1 % GEL Apply 1 application topically 3 (three) times daily as needed (pain).    . furosemide (LASIX) 20 MG tablet Take 1 tablet (20 mg total) by mouth daily. 90 tablet 3  . linezolid (ZYVOX) 600 MG tablet SMARTSIG:1 Tablet(s) By Mouth Every 12 Hours    . metoprolol tartrate (LOPRESSOR) 25 MG tablet Take 25 mg by mouth 2 (two) times daily.     . Multiple Vitamins-Minerals (MULTIVITAMIN ADULT EXTRA C PO) Take 1 tablet by mouth daily.    . Multiple Vitamins-Minerals (OCUVITE ADULT FORMULA PO) Take 1 tablet by mouth daily.    . ondansetron (ZOFRAN-ODT) 4 MG disintegrating tablet Take 4 mg by mouth every 6 (six) hours as needed.    . sildenafil (VIAGRA) 25 MG tablet Take 1 to 2 tablets by mouth as needed before sexual  intercourse.    . tamsulosin (FLOMAX) 0.4 MG CAPS capsule Take 0.4 mg by mouth daily.      No current facility-administered medications on file prior to visit.    LABS/IMAGING: No results found for this or any previous visit (from the past 48 hour(s)). No results found.  LIPID PANEL: No results found for: CHOL, TRIG, HDL, CHOLHDL, VLDL, LDLCALC, LDLDIRECT   WEIGHTS: Wt Readings from Last 3 Encounters:  12/25/19 154 lb (69.9 kg)  11/14/19 151 lb 8 oz (68.7 kg)  11/08/19 151 lb 8 oz (68.7 kg)    VITALS: BP 130/78   Pulse 68   Ht 5\' 7"  (1.702 m)   Wt 154 lb (69.9 kg)   SpO2 97%   BMI 24.12 kg/m   EXAM: General appearance: alert, no distress and pale Neck: no carotid bruit, no JVD and thyroid not enlarged, symmetric, no tenderness/mass/nodules Lungs: clear to auscultation bilaterally Heart: irregularly irregular rhythm Abdomen: soft, non-tender; bowel sounds normal; no masses,  no organomegaly Extremities: extremities normal, atraumatic, no cyanosis or edema Pulses: 2+ and symmetric Skin: Skin color, texture, turgor normal. No rashes or lesions Neurologic: Grossly normal Psych:  Pleasant  EKG: A. fib at 68, nonspecific T wave changes-personally reviewed  ASSESSMENT: 1. Status post ascending aneurysm replacement (02/2019-[facility]) 2. CAD status post CABG x2 (LIMA to LAD, SVG to D1)-11/20, [facility] 3. Intraprocedural cardiac arrest secondary to possible air embolus 4. Prolonged ventilation necessitating trach and PEG, [facility] 5. Longstanding persistent atrial fibrillation postoperative with moderate to severe biatrial enlargement normal LV function by echo in January 2021, mild to moderate RV hypokinesis, CHADSVASC Score of 5 6. Dyslipidemia 7. BPH 8. ED  PLAN: 1.   Travis Brewer did well with recent hernia repair.  He says he feels great although his echo does show significant right heart pressures.  Surprisingly he has severe TR but only mild pulmonary hypertension.  He is IVC is dilated suggesting volume overload but he is not short of breath.  We may need to reinvestigate this as he could benefit from potentially more diuresis.  He said he was interested in stopping some medications.  He did not appear overloaded on exam today and we discussed the possibility of backing off on his Lasix however given his echo findings I think that would be an advisable.  I will contact him to advise that he continue with the Lasix, in fact we may want to consider increasing his dose to 40 mg daily in the morning.  Plan follow-up with me 3 months.  Would recommend repeat labs at that time including metabolic profile and BNP.  We will discuss whether to pursue anticoagulation or consider watchman left atrial appendage occluder device.  Pixie Casino, MD, [facility], [place] Director of the Advanced Lipid Disorders &  Cardiovascular Risk Reduction Clinic Diplomate of the American Board of Clinical Lipidology Attending Cardiologist  Direct Dial: [phone]  Fax: [phone]  Website:   [email or web address] 12/25/2019, 3:01 PM

## 2019-12-25 NOTE — Patient Instructions (Addendum)
Medication Instructions:  TAKE LASIX AS NEEDED  *If you need a refill on your cardiac medications before your next appointment, please call your pharmacy*   Follow-Up: At [facility], you and your health needs are our priority.  As part of our continuing mission to provide you with exceptional heart care, we have created designated Provider Care Teams.  These Care Teams include your primary Cardiologist (physician) and Advanced Practice Providers (APPs -  Physician Assistants and Nurse Practitioners) who all work together to provide you with the care you need, when you need it.  We recommend signing up for the patient portal called "MyChart".  Sign up information is provided on this After Visit Summary.  MyChart is used to connect with patients for Virtual Visits (Telemedicine).  Patients are able to view lab/test results, encounter notes, upcoming appointments, etc.  Non-urgent messages can be sent to your provider as well.   To learn more about what you can do with MyChart, go to [email or web address].    Your next appointment:   3 month(s)  The format for your next appointment:   In Person  Provider:   You may see Dr. Debara Pickett or one of the following Advanced Practice Providers on your designated Care Team:    Almyra Deforest, PA-C  Fabian Sharp, [place] or   Roby Lofts, [place]    Other Instructions

## 2019-12-26 ENCOUNTER — Telehealth: Payer: Self-pay | Admitting: Internal Medicine

## 2019-12-26 DIAGNOSIS — I517 Cardiomegaly: Secondary | ICD-10-CM

## 2019-12-26 DIAGNOSIS — R0602 Shortness of breath: Secondary | ICD-10-CM

## 2019-12-26 DIAGNOSIS — Z79899 Other long term (current) drug therapy: Secondary | ICD-10-CM

## 2019-12-26 DIAGNOSIS — I272 Pulmonary hypertension, unspecified: Secondary | ICD-10-CM

## 2019-12-26 DIAGNOSIS — Z951 Presence of aortocoronary bypass graft: Secondary | ICD-10-CM

## 2019-12-26 DIAGNOSIS — I071 Rheumatic tricuspid insufficiency: Secondary | ICD-10-CM

## 2019-12-26 NOTE — Telephone Encounter (Signed)
Called patient, LVM to call back tomorrow.  Left call back number.

## 2019-12-26 NOTE — Telephone Encounter (Signed)
Pt said that he received Dr. Lysbeth Penner message about the Lasix. Please call to verify

## 2019-12-27 MED ORDER — FUROSEMIDE 20 MG PO TABS
40.0000 mg | ORAL_TABLET | Freq: Every day | ORAL | 3 refills | Status: DC
Start: 2019-12-27 — End: 2019-12-27

## 2019-12-27 MED ORDER — FUROSEMIDE 40 MG PO TABS
40.0000 mg | ORAL_TABLET | Freq: Every day | ORAL | 3 refills | Status: DC
Start: 2019-12-27 — End: 2021-01-12

## 2019-12-27 NOTE — Telephone Encounter (Signed)
Spoke with patient who is on the way to the office with his wife who has an appointment today at 1130. Will discuss with him at this time.

## 2019-12-27 NOTE — Telephone Encounter (Signed)
From: Pixie Casino, MD  Sent: 12/25/2019   7:15 PM EDT  To: Fidel Levy, RN  Subject: Lasix dose                                     I have further reviewed Travis Brewer echo - although he is feeling well and wants to stop meds, echo shows severe TR with elevated right heart pressure. I had initially suggested making lasix PRN, however, I think he should remain on it, in fact we should consider increasing it to 40 mg daily - would repeat labs including BMET and BNP in 2-4 weeks.   Can you reach out to him or remind me tomorrow?   Thanks.    Dr. Debara Pickett left message for patient yesterday 12/26/19

## 2020-01-08 ENCOUNTER — Ambulatory Visit: Payer: Medicare Other | Attending: Physician Assistant

## 2020-01-08 ENCOUNTER — Other Ambulatory Visit: Payer: Self-pay

## 2020-01-08 DIAGNOSIS — R293 Abnormal posture: Secondary | ICD-10-CM | POA: Insufficient documentation

## 2020-01-08 DIAGNOSIS — M6281 Muscle weakness (generalized): Secondary | ICD-10-CM | POA: Diagnosis present

## 2020-01-08 DIAGNOSIS — R2689 Other abnormalities of gait and mobility: Secondary | ICD-10-CM

## 2020-01-08 DIAGNOSIS — Z7409 Other reduced mobility: Secondary | ICD-10-CM | POA: Insufficient documentation

## 2020-01-08 NOTE — Therapy (Signed)
[place]. [place], [place], [address] Phone: [phone]   Fax:  [phone]  Physical Therapy Evaluation  Patient Details  Name: Travis Brewer MRN: [id number] Date of Birth: 1934/04/19 Referring Provider (PT): Willeen Niece, [place]   Encounter Date: 01/08/2020   PT End of Session - 01/08/20 1630    Visit Number 1    Number of Visits 16    Date for PT Re-Evaluation 03/04/20    Authorization Type Medicare    Progress Note Due on Visit 10    PT Start Time 1532    PT Stop Time 1616    PT Time Calculation (min) 44 min    Equipment Utilized During Treatment Gait belt    Activity Tolerance Patient tolerated treatment well    Behavior During Therapy WFL for tasks assessed/performed           Past Medical History:  Diagnosis Date  . Acute on chronic respiratory failure with hypoxia ([place])   . BPH (benign prostatic hyperplasia)   . CAD (coronary artery disease)   . Cancer ([place])    Melonoma on left shoulder blade removed  . Cerebral amyloid angiopathy ([place])   . Chronic atrial fibrillation ([place])   . Chronic congestive heart failure with left ventricular diastolic dysfunction ([place])   . Diaphragmatic paralysis   . Dyspnea   . Dysrhythmia   . ED (erectile dysfunction)   . Hepatitis    Hep A  . History of DVT of lower extremity    BLE DVT ~ 04/2019  . Pneumonia   . 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 ASCENDING AORTIC ANEURYSM REPAIR  02/22/2019   32 mm Hemashild graft, STJ to ascending interposition graft, resuspension of AV [facility])  . BACK SURGERY    . CARDIAC CATHETERIZATION  02/15/2019   [facility]  . CORONARY ARTERY BYPASS GRAFT  02/22/2019   LIMA to LAD, SVG to D1 [facility])  . EYE SURGERY Bilateral    Cataract surgery  . INGUINAL HERNIA REPAIR Right 11/14/2019   Procedure: RIGHT INGUINAL HERNIA REPAIR  WITH MESH;  Surgeon: Donnie Mesa, MD;  Location: [facility];  Service: General;  Laterality: Right;  LMA AND TAP BLOCK  . JOINT REPLACEMENT Bilateral    knee replacement  . L3-L4 Back surgery    . PEG TUBE PLACEMENT    . sciatic nerve cyst removal    . TONSILLECTOMY    . TRACHEOSTOMY      There were no vitals filed for this visit.    Subjective Assessment - 01/08/20 1536    Subjective Pt had open heart surgery 02/22/19. Pt was in hospital until 07/25/19 after a 5.5 cm aneurysm in aorta and blockages in heart, then diagphragm stopped working. He has been told "they weren't sure if they knicked the phrenic nerve". Pt had surgery at [facility] and was intubated 5x (cerebellar stroke during surgery, another stroke in ICU). He came to [facility] via ambulance from [place] [facility]. Pt spent a month at [facility] then in home PT with [facility] until about 1st week in August. He had inguinal repair 8/4 and no PT for 6 weeks.    Pertinent History Cataract surgery and lens implanted in L eye 2021, strokes following open heart surgery 2020, R inguinal repair 11/14/19, B TKA, needs L shoulder replacement, back surgery  Limitations Walking;Standing;Lifting;House hold activities    How long can you walk comfortably? RW at home, [facility] with wife in community    Patient Stated Goals return to travel, return to 3x/week workouts    Currently in Pain? No/denies              [facility] PT Assessment - 01/08/20 0001      Assessment   Medical Diagnosis Weakness and Balance Impairments    Referring Provider (PT) Willeen Niece, [place]    Onset Date/Surgical Date 02/22/19    Hand Dominance Right    Prior Therapy IRF, HHPT      Balance Screen   Has the patient fallen in the past 6 months No    Has the patient had a decrease in activity level because of a fear of falling?  Yes    Is the patient reluctant to leave their home because of a fear of falling?  No      Home Ecologist  residence    Living Arrangements Spouse/significant other    Available Help at Discharge Family    Type of [place] to enter    Entrance Stairs-Number of Steps [place] One level    [place] - 2 wheels;[place]asandra [place]ud[place] - single point      Prior Function   Level of Independence Independent    Vocation Other (comment)    Vocation Requirements Retired Engineer, production    Leisure work out 3x/week      Observation/Other Assessments   Observations R calf atrophy, R foot drop      Posture/Postural Control   Posture Comments forward flexed posture, SPC       Transfers   Five time sit to stand comments  13.5 s      Standardized Balance Assessment   Standardized Balance Assessment Timed Up and Go Test      Timed Up and Go Test   Normal TUG (seconds) 15.83                      Objective measurements completed on examination: See above findings.                 PT Short Term Goals - 01/08/20 1737      PT SHORT TERM GOAL #1   Title Pt will be I and compliant with initial HEP.    Time 2    Period Weeks    Status New    Target Date 01/22/20             PT Long Term Goals - 01/08/20 1738      PT LONG TERM GOAL #1   Title Pt will return to leisure exercise 3x/week.    Time 8    Period Weeks    Status New    Target Date 03/04/20      PT LONG TERM GOAL #2   Title Pt will increase walking to 30 minutes at a time with or without AD.    Time 8    Period Weeks    Status New    Target Date 03/04/20      PT LONG TERM GOAL #3   Title Pt will decrease TUG time to <13 seconds to indicate decreased fall risk.    Time 8    Period Weeks    Status New  Target Date 03/04/20      PT LONG TERM GOAL #4   Title Pt will decrease 5XSTS time to 10 seconds or less, demonstrating increase BLE power production.    Time 8    Period Weeks    Status New    Target Date 03/04/20                   Plan - 01/08/20 1730    Clinical Impression Statement Pt is an 85 yo male with extensive medical history over the last year since open heart surgery November 2020. Pt had 5 month hospital stay with 2 strokes, followed by IRF for 1 month and HHPT afterward. Pt had inguinal hernia repair on 11/14/19 and is still unable to perform crunches or exercises increasing intraabdominal pressure. Pt demonstrates impairments in posture, balance, walking speed, LE power production, strength, and body mechanics. He ambulates with SPC and R foot drop. Pt and his wife were educated on prognosis, POC, HEP resumption and verbalized understanding and consent to tx. Pt will benefit from skilled PT 2-3x/week for 6-8 weeks to address impairments and increase activity level.    Personal Factors and Comorbidities Age;Fitness;Comorbidity 3+;Time since onset of injury/illness/exacerbation;Past/Current Experience    Comorbidities Cancer, CHF, stroke    Examination-Activity Limitations [place];Locomotion Level;Stairs;Stand;Squat;Other   don/doff footwear, bathe feet and back   Examination-Participation Restrictions Interpersonal Relationship;Community Activity;Shop;Laundry;Cleaning    Stability/Clinical Decision Making Evolving/Moderate complexity    Clinical Decision Making Moderate    Rehab Potential Good    PT Frequency --   2-3x/week   PT Duration 8 weeks    PT Treatment/Interventions ADLs/Self Care Home Management;Moist Heat;Iontophoresis 4mg /ml Dexamethasone;Cryotherapy;Electrical Stimulation;Gait training;Stair training;Functional mobility training;Therapeutic activities;Neuromuscular re-education;Balance training;Therapeutic exercise;Patient/family education;DME Instruction;Manual techniques;Passive range of motion;Dry needling;Energy conservation    PT Next Visit Plan Assess HEP resumption, update HEP PRN, manual as indicated, further strength assessment, potential BERG balance test    PT Home Exercise Plan  Standing heel raises, hip ABD/EXT, marching, side stepping, mini squats    Consulted and Agree with Plan of Care Patient;Family member/caregiver           Patient will benefit from skilled therapeutic intervention in order to improve the following deficits and impairments:  Abnormal gait, Decreased balance, Decreased endurance, Decreased mobility, Difficulty walking, Improper body mechanics, Impaired perceived functional ability, Decreased activity tolerance, Decreased strength, Increased fascial restricitons, Postural dysfunction, Hypomobility, Impaired sensation  Visit Diagnosis: Impaired functional mobility, balance, gait, and endurance - Plan: PT plan of care cert/re-cert  Impairment of balance - Plan: PT plan of care cert/re-cert  Muscle weakness (generalized) - Plan: PT plan of care cert/re-cert  Abnormal posture - Plan: PT plan of care cert/re-cert     Problem List Patient Active Problem List   Diagnosis Date Noted  . Right inguinal hernia 11/14/2019  . Acute on chronic respiratory failure with hypoxia ([place])   . Diaphragmatic paralysis   . History of DVT of lower extremity   . Chronic congestive heart failure with left ventricular diastolic dysfunction ([place])   . Chronic atrial fibrillation (HCC)     Izell Roswell, PT, DPT 01/08/2020, 5:41 PM  [place]. [place], [place], [address] Phone: [phone]   Fax:  [phone]  Name: DELON REVELO MRN: [id number] Date of Birth: [birth year]

## 2020-01-10 ENCOUNTER — Encounter: Payer: Self-pay | Admitting: Physical Therapy

## 2020-01-10 ENCOUNTER — Ambulatory Visit: Payer: Medicare Other | Admitting: Physical Therapy

## 2020-01-10 ENCOUNTER — Other Ambulatory Visit: Payer: Self-pay

## 2020-01-10 DIAGNOSIS — M6281 Muscle weakness (generalized): Secondary | ICD-10-CM

## 2020-01-10 DIAGNOSIS — Z7409 Other reduced mobility: Secondary | ICD-10-CM

## 2020-01-10 DIAGNOSIS — R2689 Other abnormalities of gait and mobility: Secondary | ICD-10-CM

## 2020-01-10 DIAGNOSIS — R293 Abnormal posture: Secondary | ICD-10-CM

## 2020-01-10 NOTE — Therapy (Signed)
[place]. [place], [place], [address] Phone: [phone]   Fax:  [phone]  Physical Therapy Treatment  Patient Details  Name: Travis Brewer MRN: [id number] Date of Birth: May 30, 1934 Referring Provider (PT): [place], [place]   Encounter Date: 01/10/2020   PT End of Session - 01/10/20 1141    Visit Number 2    Number of Visits 16    Date for PT Re-Evaluation 03/04/20    Authorization Type Medicare    PT Start Time 1100    PT Stop Time 1143    PT Time Calculation (min) 43 min    Activity Tolerance Patient tolerated treatment well    Behavior During Therapy WFL for tasks assessed/performed           Past Medical History:  Diagnosis Date   Acute on chronic respiratory failure with hypoxia (HCC)    BPH (benign prostatic hyperplasia)    CAD (coronary artery disease)    Cancer ([place])    Melonoma on left shoulder blade removed   Cerebral amyloid angiopathy (HCC)    Chronic atrial fibrillation (HCC)    Chronic congestive heart failure with left ventricular diastolic dysfunction (HCC)    Diaphragmatic paralysis    Dyspnea    Dysrhythmia    ED (erectile dysfunction)    Hepatitis    Hep A   History of DVT of lower extremity    BLE DVT ~ 04/2019   Pneumonia    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   STROKE X 2    Past Surgical History:  Procedure Laterality Date   ASCENDING AORTIC ANEURYSM REPAIR  02/22/2019   32 mm Hemashild graft, STJ to ascending interposition graft, resuspension of AV ([place])   BACK SURGERY     CARDIAC CATHETERIZATION  02/15/2019   [facility]   CORONARY ARTERY BYPASS GRAFT  02/22/2019   LIMA to LAD, SVG to D1 [facility])   EYE SURGERY Bilateral    Cataract surgery   INGUINAL HERNIA REPAIR Right 11/14/2019   Procedure: RIGHT INGUINAL HERNIA REPAIR WITH MESH;  Surgeon: Donnie Mesa, MD;  Location: [facility];  Service: General;   Laterality: Right;  LMA AND TAP BLOCK   JOINT REPLACEMENT Bilateral    knee replacement   L3-L4 Back surgery     PEG TUBE PLACEMENT     sciatic nerve cyst removal     TONSILLECTOMY     TRACHEOSTOMY      There were no vitals filed for this visit.   Subjective Assessment - 01/10/20 1104    Subjective Patient reports that he would like to get back to his baseline.  He reports that he was riding a bike about 30 minutes a day and some weight    Currently in Pain? No/denies                             [facility] Adult PT Treatment/Exercise - 01/10/20 0001      High Level Balance   High Level Balance Activities Side stepping;Backward walking    High Level Balance Comments ball toss      Exercises   Exercises Knee/Hip      Knee/Hip Exercises: Aerobic   Nustep level 3 x 6 minutes      Knee/Hip Exercises: Machines for Strengthening   Cybex Knee Extension 5# 3x10    Cybex Knee Flexion 20# 3x10    Cybex  Leg Press 20# 2x10    Other Machine seated row 20#, lats 20# 2x10                    PT Short Term Goals - 01/08/20 1737      PT SHORT TERM GOAL #1   Title Pt will be I and compliant with initial HEP.    Time 2    Period Weeks    Status New    Target Date 01/22/20             PT Long Term Goals - 01/08/20 1738      PT LONG TERM GOAL #1   Title Pt will return to leisure exercise 3x/week.    Time 8    Period Weeks    Status New    Target Date 03/04/20      PT LONG TERM GOAL #2   Title Pt will increase walking to 30 minutes at a time with or without AD.    Time 8    Period Weeks    Status New    Target Date 03/04/20      PT LONG TERM GOAL #3   Title Pt will decrease TUG time to <13 seconds to indicate decreased fall risk.    Time 8    Period Weeks    Status New    Target Date 03/04/20      PT LONG TERM GOAL #4   Title Pt will decrease 5XSTS time to 10 seconds or less, demonstrating increase BLE power production.    Time 8     Period Weeks    Status New    Target Date 03/04/20                 Plan - 01/10/20 1142    Clinical Impression Statement Patient had some left shoulder pain with UE exercises, he does have issues with a drop foot that is causing trouble with the balance.  He seemed to tolerate the start and initiation of exercises without much difficulty, He does fatigue and then will have some weakness in the LE's    PT Next Visit Plan assess the exercises and add strength, endurance and balance as tolerated    Consulted and Agree with Plan of Care Patient           Patient will benefit from skilled therapeutic intervention in order to improve the following deficits and impairments:  Abnormal gait, Decreased balance, Decreased endurance, Decreased mobility, Difficulty walking, Improper body mechanics, Impaired perceived functional ability, Decreased activity tolerance, Decreased strength, Increased fascial restricitons, Postural dysfunction, Hypomobility, Impaired sensation  Visit Diagnosis: Impaired functional mobility, balance, gait, and endurance  Impairment of balance  Muscle weakness (generalized)  Abnormal posture     Problem List Patient Active Problem List   Diagnosis Date Noted   Right inguinal hernia 11/14/2019   Acute on chronic respiratory failure with hypoxia (HCC)    Diaphragmatic paralysis    History of DVT of lower extremity    Chronic congestive heart failure with left ventricular diastolic dysfunction ([place])    Chronic atrial fibrillation (HCC)     Sumner Boast., PT 01/10/2020, 11:44 AM  [place]. [place], [place], [address] Phone: [phone]   Fax:  [phone]  Name: Travis Brewer MRN: [id number] Date of Birth: 1935-03-13

## 2020-01-14 ENCOUNTER — Other Ambulatory Visit: Payer: Self-pay

## 2020-01-14 ENCOUNTER — Encounter: Payer: Self-pay | Admitting: Physical Therapy

## 2020-01-14 ENCOUNTER — Ambulatory Visit: Payer: Medicare Other | Attending: Physician Assistant | Admitting: Physical Therapy

## 2020-01-14 DIAGNOSIS — R262 Difficulty in walking, not elsewhere classified: Secondary | ICD-10-CM | POA: Diagnosis present

## 2020-01-14 DIAGNOSIS — R293 Abnormal posture: Secondary | ICD-10-CM | POA: Diagnosis present

## 2020-01-14 DIAGNOSIS — Z7409 Other reduced mobility: Secondary | ICD-10-CM | POA: Diagnosis present

## 2020-01-14 DIAGNOSIS — R2689 Other abnormalities of gait and mobility: Secondary | ICD-10-CM | POA: Diagnosis present

## 2020-01-14 DIAGNOSIS — M6281 Muscle weakness (generalized): Secondary | ICD-10-CM | POA: Diagnosis present

## 2020-01-14 NOTE — Therapy (Signed)
[place]. [place], [place], [address] Phone: [phone]   Fax:  [phone]  Physical Therapy Treatment  Patient Details  Name: Travis Brewer MRN: [id number] Date of Birth: 1934-09-01 Referring Provider (PT): Willeen Niece, [place]   Encounter Date: 01/14/2020   PT End of Session - 01/14/20 1226    Visit Number 3    Number of Visits 16    Date for PT Re-Evaluation 03/04/20    Authorization Type Medicare    Progress Note Due on Visit 10    PT Start Time 2355    PT Stop Time 1228    PT Time Calculation (min) 43 min    Activity Tolerance Patient tolerated treatment well    Behavior During Therapy [facility] for tasks assessed/performed           Past Medical History:  Diagnosis Date  . Acute on chronic respiratory failure with hypoxia ([place])   . BPH (benign prostatic hyperplasia)   . CAD (coronary artery disease)   . Cancer ([place])    Melonoma on left shoulder blade removed  . Cerebral amyloid angiopathy ([place])   . Chronic atrial fibrillation ([place])   . Chronic congestive heart failure with left ventricular diastolic dysfunction ([place])   . Diaphragmatic paralysis   . Dyspnea   . Dysrhythmia   . ED (erectile dysfunction)   . Hepatitis    Hep A  . History of DVT of lower extremity    BLE DVT ~ 04/2019  . Pneumonia   . 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 ASCENDING AORTIC ANEURYSM REPAIR  02/22/2019   32 mm Hemashild graft, STJ to ascending interposition graft, resuspension of AV [facility])  . BACK SURGERY    . CARDIAC CATHETERIZATION  02/15/2019   [facility]  . CORONARY ARTERY BYPASS GRAFT  02/22/2019   LIMA to LAD, SVG to D1 [facility])  . EYE SURGERY Bilateral    Cataract surgery  . INGUINAL HERNIA REPAIR Right 11/14/2019   Procedure: RIGHT INGUINAL HERNIA REPAIR WITH MESH;  Surgeon: Donnie Mesa, MD;   Location: [facility];  Service: General;  Laterality: Right;  LMA AND TAP BLOCK  . JOINT REPLACEMENT Bilateral    knee replacement  . L3-L4 Back surgery    . PEG TUBE PLACEMENT    . sciatic nerve cyst removal    . TONSILLECTOMY    . TRACHEOSTOMY      There were no vitals filed for this visit.   Subjective Assessment - 01/14/20 1150    Subjective Feeling good a little paining L shoulder    Currently in Pain? Yes    Pain Score 5     Pain Location Shoulder    Pain Orientation Left                             [facility] Adult PT Treatment/Exercise - 01/14/20 0001      Knee/Hip Exercises: Aerobic   Nustep level 5 x 7 minutes      Knee/Hip Exercises: Machines for Strengthening   Cybex Knee Extension 5# 3x10    Cybex Knee Flexion 20# 3x10    Cybex Leg Press 20# 3x10    Other Machine seated row 2x10 20#, lats 20# 10      Knee/Hip Exercises: Standing   Other Standing Knee Exercises  Alt 4 in box taps x10 each, 6 in x 5 eah       Knee/Hip Exercises: Seated   Sit to Sand 2 sets;5 reps;10 reps   UE support kn knee                    PT Short Term Goals - 01/14/20 1230      PT SHORT TERM GOAL #1   Title Pt will be I and compliant with initial HEP.    Status Achieved             PT Long Term Goals - 01/14/20 1231      PT LONG TERM GOAL #1   Title Pt will return to leisure exercise 3x/week.    Status On-going      PT LONG TERM GOAL #2   Title Pt will increase walking to 30 minutes at a time with or without AD.    Status On-going                 Plan - 01/14/20 1227    Clinical Impression Statement Pt continues to report L shoulder pain, he was unable to complete the second set of lat pull downs. Some instability noted with alt box taps. Cues to control the she sitting phase of sit to stand. He was able to do an additional set of leg press without issue.    Personal Factors and Comorbidities Age;Fitness;Comorbidity 3+;Time since onset of  injury/illness/exacerbation;Past/Current Experience    Comorbidities Cancer, CHF, stroke    Examination-Participation Restrictions Interpersonal Relationship;Community Activity;Shop;Laundry;Cleaning    Stability/Clinical Decision Making Evolving/Moderate complexity    Rehab Potential Good    PT Duration 8 weeks    PT Treatment/Interventions ADLs/Self Care Home Management;Moist Heat;Iontophoresis 4mg /ml Dexamethasone;Cryotherapy;Electrical Stimulation;Gait training;Stair training;Functional mobility training;Therapeutic activities;Neuromuscular re-education;Balance training;Therapeutic exercise;Patient/family education;DME Instruction;Manual techniques;Passive range of motion;Dry needling;Energy conservation    PT Next Visit Plan assess the exercises and add strength, endurance and balance as tolerated           Patient will benefit from skilled therapeutic intervention in order to improve the following deficits and impairments:  Abnormal gait, Decreased balance, Decreased endurance, Decreased mobility, Difficulty walking, Improper body mechanics, Impaired perceived functional ability, Decreased activity tolerance, Decreased strength, Increased fascial restricitons, Postural dysfunction, Hypomobility, Impaired sensation  Visit Diagnosis: Muscle weakness (generalized)  Abnormal posture  Impairment of balance  Impaired functional mobility, balance, gait, and endurance     Problem List Patient Active Problem List   Diagnosis Date Noted  . Right inguinal hernia 11/14/2019  . Acute on chronic respiratory failure with hypoxia ([place])   . Diaphragmatic paralysis   . History of DVT of lower extremity   . Chronic congestive heart failure with left ventricular diastolic dysfunction ([place])   . Chronic atrial fibrillation (HCC)     Scot Jun, PTA 01/14/2020, 12:32 PM  [place]. [place], [place], [address] Phone:  [phone]   Fax:  [phone]  Name: Travis Brewer MRN: [id number] Date of Birth: 17-Aug-1934

## 2020-01-16 ENCOUNTER — Encounter: Payer: Self-pay | Admitting: Physical Therapy

## 2020-01-16 ENCOUNTER — Ambulatory Visit: Payer: Medicare Other | Admitting: Physical Therapy

## 2020-01-16 ENCOUNTER — Other Ambulatory Visit: Payer: Self-pay

## 2020-01-16 DIAGNOSIS — R293 Abnormal posture: Secondary | ICD-10-CM

## 2020-01-16 DIAGNOSIS — M6281 Muscle weakness (generalized): Secondary | ICD-10-CM

## 2020-01-16 DIAGNOSIS — Z7409 Other reduced mobility: Secondary | ICD-10-CM

## 2020-01-16 DIAGNOSIS — R2689 Other abnormalities of gait and mobility: Secondary | ICD-10-CM

## 2020-01-16 NOTE — Therapy (Signed)
[place]. [place], [place], [address] Phone: [phone]   Fax:  [phone]  Physical Therapy Treatment  Patient Details  Name: Travis Brewer MRN: [id number] Date of Birth: [birth year] Referring Provider (PT): Willeen Niece, [place]   Encounter Date: 01/16/2020   PT End of Session - 01/16/20 1347    Visit Number 4    Number of Visits 16    Date for PT Re-Evaluation 03/04/20    Authorization Type Medicare    PT Start Time 1300    PT Stop Time 1345    PT Time Calculation (min) 45 min    Activity Tolerance Patient tolerated treatment well    Behavior During Therapy [facility] for tasks assessed/performed           Past Medical History:  Diagnosis Date  . Acute on chronic respiratory failure with hypoxia ([place])   . BPH (benign prostatic hyperplasia)   . CAD (coronary artery disease)   . Cancer ([place])    Melonoma on left shoulder blade removed  . Cerebral amyloid angiopathy ([place])   . Chronic atrial fibrillation ([place])   . Chronic congestive heart failure with left ventricular diastolic dysfunction ([place])   . Diaphragmatic paralysis   . Dyspnea   . Dysrhythmia   . ED (erectile dysfunction)   . Hepatitis    Hep A  . History of DVT of lower extremity    BLE DVT ~ 04/2019  . Pneumonia   . 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 ASCENDING AORTIC ANEURYSM REPAIR  02/22/2019   32 mm Hemashild graft, STJ to ascending interposition graft, resuspension of AV [facility])  . BACK SURGERY    . CARDIAC CATHETERIZATION  02/15/2019   [facility]  . CORONARY ARTERY BYPASS GRAFT  02/22/2019   LIMA to LAD, SVG to D1 [facility])  . EYE SURGERY Bilateral    Cataract surgery  . INGUINAL HERNIA REPAIR Right 11/14/2019   Procedure: RIGHT INGUINAL HERNIA REPAIR WITH MESH;  Surgeon: Donnie Mesa, MD;  Location: [place];  Service: General;   Laterality: Right;  LMA AND TAP BLOCK  . JOINT REPLACEMENT Bilateral    knee replacement  . L3-L4 Back surgery    . PEG TUBE PLACEMENT    . sciatic nerve cyst removal    . TONSILLECTOMY    . TRACHEOSTOMY      There were no vitals filed for this visit.   Subjective Assessment - 01/16/20 1300    Subjective Doing ok, walking more and more without the cane    Currently in Pain? Yes    Pain Score 5     Pain Location Shoulder    Pain Orientation Left                             [facility] Adult PT Treatment/Exercise - 01/16/20 0001      High Level Balance   High Level Balance Comments Alt toe taps on foam roll sith SPC      Knee/Hip Exercises: Aerobic   Nustep level 5 x 98minutes      Knee/Hip Exercises: Machines for Strengthening   Cybex Knee Extension 10lb 2x15    Cybex Knee Flexion 25lb 2x15    Cybex Leg Press 30# 3x10      Knee/Hip Exercises: Standing   Walking with  Sports Cord 30lb 4 way x 3 each     Other Standing Knee Exercises Alt  6in box taps 2x10 with SPC       Knee/Hip Exercises: Seated   Sit to Sand 2 sets;5 reps;10 reps                    PT Short Term Goals - 01/14/20 1230      PT SHORT TERM GOAL #1   Title Pt will be I and compliant with initial HEP.    Status Achieved             PT Long Term Goals - 01/14/20 1231      PT LONG TERM GOAL #1   Title Pt will return to leisure exercise 3x/week.    Status On-going      PT LONG TERM GOAL #2   Title Pt will increase walking to 30 minutes at a time with or without AD.    Status On-going                 Plan - 01/16/20 1348    Clinical Impression Statement L shoulder pain continues.  Instability noted with resisted gait requiring assist at times to maintain balance. Difficulty with toe taps to foam roll, RLE weakness made it difficulty to tap soft surface with LLE. He did well with machine level interventions.    Personal Factors and Comorbidities  Age;Fitness;Comorbidity 3+;Time since onset of injury/illness/exacerbation;Past/Current Experience    Comorbidities Cancer, CHF, stroke    Examination-Activity Limitations Bend;Locomotion Level;Stairs;Stand;Squat;Other    Examination-Participation Restrictions Interpersonal Relationship;Community Activity;Shop;Laundry;Cleaning    Stability/Clinical Decision Making Evolving/Moderate complexity    Rehab Potential Good    PT Duration 8 weeks    PT Treatment/Interventions ADLs/Self Care Home Management;Moist Heat;Iontophoresis 4mg /ml Dexamethasone;Cryotherapy;Electrical Stimulation;Gait training;Stair training;Functional mobility training;Therapeutic activities;Neuromuscular re-education;Balance training;Therapeutic exercise;Patient/family education;DME Instruction;Manual techniques;Passive range of motion;Dry needling;Energy conservation    PT Next Visit Plan assess the exercises and add strength, endurance and balance as tolerated           Patient will benefit from skilled therapeutic intervention in order to improve the following deficits and impairments:  Abnormal gait, Decreased balance, Decreased endurance, Decreased mobility, Difficulty walking, Improper body mechanics, Impaired perceived functional ability, Decreased activity tolerance, Decreased strength, Increased fascial restricitons, Postural dysfunction, Hypomobility, Impaired sensation  Visit Diagnosis: Impairment of balance  Impaired functional mobility, balance, gait, and endurance  Muscle weakness (generalized)  Abnormal posture     Problem List Patient Active Problem List   Diagnosis Date Noted  . Right inguinal hernia 11/14/2019  . Acute on chronic respiratory failure with hypoxia ([place])   . Diaphragmatic paralysis   . History of DVT of lower extremity   . Chronic congestive heart failure with left ventricular diastolic dysfunction ([place])   . Chronic atrial fibrillation ([place])     Scot Jun,  PTA 01/16/2020, 1:51 PM  [place]. [place], [place], [address] Phone: [phone]   Fax:  [phone]  Name: Travis Brewer MRN: [id number] Date of Birth: 1934-05-30

## 2020-01-18 ENCOUNTER — Ambulatory Visit: Payer: Medicare Other | Admitting: Physical Therapy

## 2020-01-18 ENCOUNTER — Other Ambulatory Visit: Payer: Self-pay

## 2020-01-18 ENCOUNTER — Encounter: Payer: Self-pay | Admitting: Physical Therapy

## 2020-01-18 DIAGNOSIS — M6281 Muscle weakness (generalized): Secondary | ICD-10-CM

## 2020-01-18 DIAGNOSIS — R293 Abnormal posture: Secondary | ICD-10-CM

## 2020-01-18 DIAGNOSIS — Z7409 Other reduced mobility: Secondary | ICD-10-CM

## 2020-01-18 DIAGNOSIS — R2689 Other abnormalities of gait and mobility: Secondary | ICD-10-CM

## 2020-01-18 NOTE — Therapy (Signed)
[place]. [place], [place], [address] Phone: [phone]   Fax:  [phone]  Physical Therapy Treatment  Patient Details  Name: Travis Brewer MRN: [id number] Date of Birth: 1934/08/19 Referring Provider (PT): Willeen Niece, [place]   Encounter Date: 01/18/2020   PT End of Session - 01/18/20 1143    Visit Number 5    Number of Visits 16    Date for PT Re-Evaluation 03/04/20    Authorization Type Medicare    PT Start Time 1058    PT Stop Time 1143    PT Time Calculation (min) 45 min    Activity Tolerance Patient tolerated treatment well    Behavior During Therapy [facility] for tasks assessed/performed           Past Medical History:  Diagnosis Date  . Acute on chronic respiratory failure with hypoxia ([place])   . BPH (benign prostatic hyperplasia)   . CAD (coronary artery disease)   . Cancer ([place])    Melonoma on left shoulder blade removed  . Cerebral amyloid angiopathy ([place])   . Chronic atrial fibrillation ([place])   . Chronic congestive heart failure with left ventricular diastolic dysfunction ([place])   . Diaphragmatic paralysis   . Dyspnea   . Dysrhythmia   . ED (erectile dysfunction)   . Hepatitis    Hep A  . History of DVT of lower extremity    BLE DVT ~ 04/2019  . Pneumonia   . 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 ASCENDING AORTIC ANEURYSM REPAIR  02/22/2019   32 mm Hemashild graft, STJ to ascending interposition graft, resuspension of AV [facility])  . BACK SURGERY    . CARDIAC CATHETERIZATION  02/15/2019   [facility]  . CORONARY ARTERY BYPASS GRAFT  02/22/2019   LIMA to LAD, SVG to D1 [facility])  . EYE SURGERY Bilateral    Cataract surgery  . INGUINAL HERNIA REPAIR Right 11/14/2019   Procedure: RIGHT INGUINAL HERNIA REPAIR WITH MESH;  Surgeon: Donnie Mesa, MD;  Location: [place];  Service: General;   Laterality: Right;  LMA AND TAP BLOCK  . JOINT REPLACEMENT Bilateral    knee replacement  . L3-L4 Back surgery    . PEG TUBE PLACEMENT    . sciatic nerve cyst removal    . TONSILLECTOMY    . TRACHEOSTOMY      There were no vitals filed for this visit.   Subjective Assessment - 01/18/20 1106    Subjective Pt reports he is doing okay; states shoulder is hurting him all the time.    Currently in Pain? Yes    Pain Score 5     Pain Location Shoulder    Pain Orientation Left                             [facility] Adult PT Treatment/Exercise - 01/18/20 0001      Knee/Hip Exercises: Aerobic   Nustep L5 x 6 min      Knee/Hip Exercises: Machines for Strengthening   Cybex Knee Extension 10lb 2x15    Cybex Knee Flexion 25lb 2x15    Cybex Leg Press 30# 3x10    Other Machine rows and lats 20# 2x10      Knee/Hip Exercises: Standing   Other Standing Knee Exercises alt 6 in stair staps  1x10 with 1HR and 1x10 with no UE assist and CGA      Knee/Hip Exercises: Seated   Sit to Sand 3 sets;5 reps;with UE support   pushing up from thighs                   PT Short Term Goals - 01/14/20 1230      PT SHORT TERM GOAL #1   Title Pt will be I and compliant with initial HEP.    Status Achieved             PT Long Term Goals - 01/14/20 1231      PT LONG TERM GOAL #1   Title Pt will return to leisure exercise 3x/week.    Status On-going      PT LONG TERM GOAL #2   Title Pt will increase walking to 30 minutes at a time with or without AD.    Status On-going                 Plan - 01/18/20 1143    Clinical Impression Statement Pt limited by L shoulder pain again this rx. Pt experienced instability with standing shoulder extensions and alt step taps; CGA for occasional near LOB. Progressed from HR assist to no UE assist with alt step taps; cues for foot clearance. Notable R foot drop with gait.    PT Treatment/Interventions ADLs/Self Care Home  Management;Moist Heat;Iontophoresis 4mg /ml Dexamethasone;Cryotherapy;Electrical Stimulation;Gait training;Stair training;Functional mobility training;Therapeutic activities;Neuromuscular re-education;Balance training;Therapeutic exercise;Patient/family education;DME Instruction;Manual techniques;Passive range of motion;Dry needling;Energy conservation    PT Next Visit Plan assess the exercises and add strength, endurance and balance as tolerated    Consulted and Agree with Plan of Care Patient           Patient will benefit from skilled therapeutic intervention in order to improve the following deficits and impairments:  Abnormal gait, Decreased balance, Decreased endurance, Decreased mobility, Difficulty walking, Improper body mechanics, Impaired perceived functional ability, Decreased activity tolerance, Decreased strength, Increased fascial restricitons, Postural dysfunction, Hypomobility, Impaired sensation  Visit Diagnosis: Impairment of balance  Impaired functional mobility, balance, gait, and endurance  Muscle weakness (generalized)  Abnormal posture     Problem List Patient Active Problem List   Diagnosis Date Noted  . Right inguinal hernia 11/14/2019  . Acute on chronic respiratory failure with hypoxia ([place])   . Diaphragmatic paralysis   . History of DVT of lower extremity   . Chronic congestive heart failure with left ventricular diastolic dysfunction ([place])   . Chronic atrial fibrillation [facility])    Travis Brewer, PT, DPT [facility] [place] [facility] 01/18/2020, 11:46 AM  [place]. [place], [place], [address] Phone: [phone]   Fax:  [phone]  Name: Travis Brewer MRN: [id number] Date of Birth: July 21, 1934

## 2020-01-21 ENCOUNTER — Ambulatory Visit: Payer: Medicare Other | Admitting: Physical Therapy

## 2020-01-21 ENCOUNTER — Encounter: Payer: Self-pay | Admitting: Physical Therapy

## 2020-01-21 ENCOUNTER — Other Ambulatory Visit: Payer: Self-pay

## 2020-01-21 DIAGNOSIS — R262 Difficulty in walking, not elsewhere classified: Secondary | ICD-10-CM

## 2020-01-21 DIAGNOSIS — M6281 Muscle weakness (generalized): Secondary | ICD-10-CM

## 2020-01-21 DIAGNOSIS — Z7409 Other reduced mobility: Secondary | ICD-10-CM

## 2020-01-21 DIAGNOSIS — R2689 Other abnormalities of gait and mobility: Secondary | ICD-10-CM

## 2020-01-21 DIAGNOSIS — R293 Abnormal posture: Secondary | ICD-10-CM

## 2020-01-21 NOTE — Therapy (Signed)
[place]. [place], [place], [address] Phone: [phone]   Fax:  [phone]  Physical Therapy Treatment  Patient Details  Name: Travis Brewer MRN: [id number] Date of Birth: 01/01/1935 Referring Provider (PT): Willeen Niece, [place]   Encounter Date: 01/21/2020   PT End of Session - 01/21/20 1147    Visit Number 6    Number of Visits 16    Date for PT Re-Evaluation 03/04/20    Authorization Type Medicare    PT Start Time 1056    PT Stop Time 1145    PT Time Calculation (min) 49 min    Activity Tolerance Patient tolerated treatment well    Behavior During Therapy [facility] for tasks assessed/performed           Past Medical History:  Diagnosis Date  . Acute on chronic respiratory failure with hypoxia ([place])   . BPH (benign prostatic hyperplasia)   . CAD (coronary artery disease)   . Cancer ([place])    Melonoma on left shoulder blade removed  . Cerebral amyloid angiopathy ([place])   . Chronic atrial fibrillation ([place])   . Chronic congestive heart failure with left ventricular diastolic dysfunction ([place])   . Diaphragmatic paralysis   . Dyspnea   . Dysrhythmia   . ED (erectile dysfunction)   . Hepatitis    Hep A  . History of DVT of lower extremity    BLE DVT ~ 04/2019  . Pneumonia   . 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 ASCENDING AORTIC ANEURYSM REPAIR  02/22/2019   32 mm Hemashild graft, STJ to ascending interposition graft, resuspension of AV [facility])  . BACK SURGERY    . CARDIAC CATHETERIZATION  02/15/2019   [facility]  . CORONARY ARTERY BYPASS GRAFT  02/22/2019   LIMA to LAD, SVG to D1 [facility])  . EYE SURGERY Bilateral    Cataract surgery  . INGUINAL HERNIA REPAIR Right 11/14/2019   Procedure: RIGHT INGUINAL HERNIA REPAIR WITH MESH;  Surgeon: Donnie Mesa, MD;  Location: [place];  Service: General;   Laterality: Right;  LMA AND TAP BLOCK  . JOINT REPLACEMENT Bilateral    knee replacement  . L3-L4 Back surgery    . PEG TUBE PLACEMENT    . sciatic nerve cyst removal    . TONSILLECTOMY    . TRACHEOSTOMY      There were no vitals filed for this visit.   Subjective Assessment - 01/21/20 1103    Subjective No falls, feeling stronger    Currently in Pain? Yes    Pain Score 5     Pain Location Shoulder    Pain Orientation Left    Pain Descriptors / Indicators Sore    Aggravating Factors  any movments              [facility] PT Assessment - 01/21/20 0001      Timed Up and Go Test   Normal TUG (seconds) 13                         [facility] Adult PT Treatment/Exercise - 01/21/20 0001      High Level Balance   High Level Balance Comments on airex balance and then ball toss, green tband resisted side stepping      Knee/Hip Exercises: Aerobic   Recumbent Bike x 5  minutes    Nustep L5 x 6 min      Knee/Hip Exercises: Machines for Strengthening   Cybex Knee Extension 10lb 2x15    Cybex Knee Flexion 25lb 2x15    Cybex Leg Press 40# 3x10, 20# calf raises    Other Machine rows and lats 20# 2x10      Knee/Hip Exercises: Seated   Other Seated Knee/Hip Exercises sit ti stand with weighted ball 2x5    Other Seated Knee/Hip Exercises red tband left shoulder extension, yellow tband ER working on shoulder stability                    PT Short Term Goals - 01/14/20 1230      PT SHORT TERM GOAL #1   Title Pt will be I and compliant with initial HEP.    Status Achieved             PT Long Term Goals - 01/14/20 1231      PT LONG TERM GOAL #1   Title Pt will return to leisure exercise 3x/week.    Status On-going      PT LONG TERM GOAL #2   Title Pt will increase walking to 30 minutes at a time with or without AD.    Status On-going                 Plan - 01/21/20 1147    Clinical Impression Statement I added some weight to leg press, tried some  high level balance, side stepping.  He was very unsteady with the airex standing, the increased weight on the leg press was good but he definitely was fatigued, the right ankle due to neuropathy is very unstable.    PT Next Visit Plan continue toprogress as tolerated    Consulted and Agree with Plan of Care Patient           Patient will benefit from skilled therapeutic intervention in order to improve the following deficits and impairments:  Abnormal gait, Decreased balance, Decreased endurance, Decreased mobility, Difficulty walking, Improper body mechanics, Impaired perceived functional ability, Decreased activity tolerance, Decreased strength, Increased fascial restricitons, Postural dysfunction, Hypomobility, Impaired sensation  Visit Diagnosis: Impairment of balance  Impaired functional mobility, balance, gait, and endurance  Muscle weakness (generalized)  Abnormal posture  Difficulty in walking, not elsewhere classified     Problem List Patient Active Problem List   Diagnosis Date Noted  . Right inguinal hernia 11/14/2019  . Acute on chronic respiratory failure with hypoxia ([place])   . Diaphragmatic paralysis   . History of DVT of lower extremity   . Chronic congestive heart failure with left ventricular diastolic dysfunction ([place])   . Chronic atrial fibrillation ([place])     Sumner Boast., PT 01/21/2020, 11:50 AM  [place]. [place], [place], [address] Phone: [phone]   Fax:  [phone]  Name: Travis Brewer MRN: [id number] Date of Birth: [birth year]

## 2020-01-23 ENCOUNTER — Encounter: Payer: Self-pay | Admitting: Physical Therapy

## 2020-01-23 ENCOUNTER — Other Ambulatory Visit: Payer: Self-pay

## 2020-01-23 ENCOUNTER — Ambulatory Visit: Payer: Medicare Other | Admitting: Physical Therapy

## 2020-01-23 DIAGNOSIS — M6281 Muscle weakness (generalized): Secondary | ICD-10-CM

## 2020-01-23 DIAGNOSIS — R2689 Other abnormalities of gait and mobility: Secondary | ICD-10-CM

## 2020-01-23 DIAGNOSIS — R293 Abnormal posture: Secondary | ICD-10-CM

## 2020-01-23 DIAGNOSIS — Z7409 Other reduced mobility: Secondary | ICD-10-CM

## 2020-01-23 NOTE — Therapy (Signed)
[place]. [place], [place], [address] Phone: [phone]   Fax:  [phone]  Physical Therapy Treatment  Patient Details  Name: Travis Brewer MRN: [id number] Date of Birth: February 23, 1935 Referring Provider (PT): Willeen Niece, [place]   Encounter Date: 01/23/2020   PT End of Session - 01/23/20 1142    Visit Number 7    Date for PT Re-Evaluation 03/04/20    Authorization Type Medicare    PT Start Time 1100    PT Stop Time 1145    PT Time Calculation (min) 45 min    Activity Tolerance Patient tolerated treatment well    Behavior During Therapy [facility] for tasks assessed/performed           Past Medical History:  Diagnosis Date  . Acute on chronic respiratory failure with hypoxia ([place])   . BPH (benign prostatic hyperplasia)   . CAD (coronary artery disease)   . Cancer ([place])    Melonoma on left shoulder blade removed  . Cerebral amyloid angiopathy ([place])   . Chronic atrial fibrillation ([place])   . Chronic congestive heart failure with left ventricular diastolic dysfunction ([place])   . Diaphragmatic paralysis   . Dyspnea   . Dysrhythmia   . ED (erectile dysfunction)   . Hepatitis    Hep A  . History of DVT of lower extremity    BLE DVT ~ 04/2019  . Pneumonia   . 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 ASCENDING AORTIC ANEURYSM REPAIR  02/22/2019   32 mm Hemashild graft, STJ to ascending interposition graft, resuspension of AV [facility])  . BACK SURGERY    . CARDIAC CATHETERIZATION  02/15/2019   [facility]  . CORONARY ARTERY BYPASS GRAFT  02/22/2019   LIMA to LAD, SVG to D1 [facility])  . EYE SURGERY Bilateral    Cataract surgery  . INGUINAL HERNIA REPAIR Right 11/14/2019   Procedure: RIGHT INGUINAL HERNIA REPAIR WITH MESH;  Surgeon: Donnie Mesa, MD;  Location: [place];  Service: General;  Laterality: Right;  LMA AND  TAP BLOCK  . JOINT REPLACEMENT Bilateral    knee replacement  . L3-L4 Back surgery    . PEG TUBE PLACEMENT    . sciatic nerve cyst removal    . TONSILLECTOMY    . TRACHEOSTOMY      There were no vitals filed for this visit.   Subjective Assessment - 01/23/20 1103    Subjective Doing ok    Currently in Pain? Yes    Pain Score 4     Pain Location Shoulder    Pain Orientation Left                             [facility] Adult PT Treatment/Exercise - 01/23/20 0001      Knee/Hip Exercises: Aerobic   Nustep L5 x 6 min      Knee/Hip Exercises: Machines for Strengthening   Cybex Knee Extension 10lb 2x15    Cybex Knee Flexion 25lb 2x15    Cybex Leg Press 40# 3x10, 20# calf raises    Other Machine rows and lats 20# 2x10      Knee/Hip Exercises: Seated   Other Seated Knee/Hip Exercises red tband left shoulder extension, yellow tband ER working on shoulder stability  PT Short Term Goals - 01/14/20 1230      PT SHORT TERM GOAL #1   Title Pt will be I and compliant with initial HEP.    Status Achieved             PT Long Term Goals - 01/14/20 1231      PT LONG TERM GOAL #1   Title Pt will return to leisure exercise 3x/week.    Status On-going      PT LONG TERM GOAL #2   Title Pt will increase walking to 30 minutes at a time with or without AD.    Status On-going                 Plan - 01/23/20 1143    Clinical Impression Statement Pt did well today's with good carryover form last session. L shoulder pain remains but report sit hurt less with lat pull downs compared to previous times. Cue needed to complete full ROM with leg extensions. Weakness in both ankles remains.    Personal Factors and Comorbidities Age;Fitness;Comorbidity 3+;Time since onset of injury/illness/exacerbation;Past/Current Experience    Comorbidities Cancer, CHF, stroke    Examination-Activity Limitations Bend;Locomotion Level;Stairs;Stand;Squat;Other     Examination-Participation Restrictions Interpersonal Relationship;Community Activity;Shop;Laundry;Cleaning    Stability/Clinical Decision Making Evolving/Moderate complexity    Rehab Potential Good    PT Duration 8 weeks    PT Treatment/Interventions ADLs/Self Care Home Management;Moist Heat;Iontophoresis 4mg /ml Dexamethasone;Cryotherapy;Electrical Stimulation;Gait training;Stair training;Functional mobility training;Therapeutic activities;Neuromuscular re-education;Balance training;Therapeutic exercise;Patient/family education;DME Instruction;Manual techniques;Passive range of motion;Dry needling;Energy conservation    PT Next Visit Plan continue to progress as tolerated           Patient will benefit from skilled therapeutic intervention in order to improve the following deficits and impairments:  Abnormal gait, Decreased balance, Decreased endurance, Decreased mobility, Difficulty walking, Improper body mechanics, Impaired perceived functional ability, Decreased activity tolerance, Decreased strength, Increased fascial restricitons, Postural dysfunction, Hypomobility, Impaired sensation  Visit Diagnosis: Impairment of balance  Muscle weakness (generalized)  Abnormal posture  Impaired functional mobility, balance, gait, and endurance     Problem List Patient Active Problem List   Diagnosis Date Noted  . Right inguinal hernia 11/14/2019  . Acute on chronic respiratory failure with hypoxia ([place])   . Diaphragmatic paralysis   . History of DVT of lower extremity   . Chronic congestive heart failure with left ventricular diastolic dysfunction ([place])   . Chronic atrial fibrillation ([facility])     Scot Jun, PTA 01/23/2020, 11:46 AM  [place]. [place], [place], [address] Phone: [phone]   Fax:  [phone]  Name: SAMIT SYLVE MRN: [id number] Date of Birth: 1934/10/12

## 2020-01-25 ENCOUNTER — Other Ambulatory Visit: Payer: Self-pay

## 2020-01-25 ENCOUNTER — Encounter: Payer: Self-pay | Admitting: Physical Therapy

## 2020-01-25 ENCOUNTER — Ambulatory Visit: Payer: Medicare Other | Admitting: Physical Therapy

## 2020-01-25 DIAGNOSIS — M6281 Muscle weakness (generalized): Secondary | ICD-10-CM | POA: Diagnosis not present

## 2020-01-25 DIAGNOSIS — R2689 Other abnormalities of gait and mobility: Secondary | ICD-10-CM

## 2020-01-25 DIAGNOSIS — R293 Abnormal posture: Secondary | ICD-10-CM

## 2020-01-25 NOTE — Therapy (Signed)
[place]. [place], [place], [address] Phone: [phone]   Fax:  [phone]  Physical Therapy Treatment  Patient Details  Name: Travis Brewer MRN: [id number] Date of Birth: 1934/06/11 Referring Provider (PT): Willeen Niece, [place]   Encounter Date: 01/25/2020   PT End of Session - 01/25/20 1148    Visit Number 8    Number of Visits 16    Date for PT Re-Evaluation 03/04/20    Authorization Type Medicare    PT Start Time 0100    PT Stop Time 8416    PT Time Calculation (min) 648 min    Activity Tolerance Patient tolerated treatment well    Behavior During Therapy [facility] for tasks assessed/performed           Past Medical History:  Diagnosis Date  . Acute on chronic respiratory failure with hypoxia ([employer])   . BPH (benign prostatic hyperplasia)   . CAD (coronary artery disease)   . Cancer ([place])    Melonoma on left shoulder blade removed  . Cerebral amyloid angiopathy ([place])   . Chronic atrial fibrillation ([place])   . Chronic congestive heart failure with left ventricular diastolic dysfunction ([place])   . Diaphragmatic paralysis   . Dyspnea   . Dysrhythmia   . ED (erectile dysfunction)   . Hepatitis    Hep A  . History of DVT of lower extremity    BLE DVT ~ 04/2019  . Pneumonia   . 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 ASCENDING AORTIC ANEURYSM REPAIR  02/22/2019   32 mm Hemashild graft, STJ to ascending interposition graft, resuspension of AV [facility])  . BACK SURGERY    . CARDIAC CATHETERIZATION  02/15/2019   [facility]  . CORONARY ARTERY BYPASS GRAFT  02/22/2019   LIMA to LAD, SVG to D1 [facility])  . EYE SURGERY Bilateral    Cataract surgery  . INGUINAL HERNIA REPAIR Right 11/14/2019   Procedure: RIGHT INGUINAL HERNIA REPAIR WITH MESH;  Surgeon: Donnie Mesa, MD;  Location: [place];  Service: General;   Laterality: Right;  LMA AND TAP BLOCK  . JOINT REPLACEMENT Bilateral    knee replacement  . L3-L4 Back surgery    . PEG TUBE PLACEMENT    . sciatic nerve cyst removal    . TONSILLECTOMY    . TRACHEOSTOMY      There were no vitals filed for this visit.   Subjective Assessment - 01/25/20 1102    Subjective "100% except for this L shoulder"    Currently in Pain? Yes    Pain Score 3     Pain Location Shoulder    Pain Orientation Left                             [facility] Adult PT Treatment/Exercise - 01/25/20 0001      Knee/Hip Exercises: Aerobic   Recumbent Bike L1 x 4 min     Nustep L5 x 6 min      Knee/Hip Exercises: Machines for Strengthening   Cybex Leg Press 40# 3x10, 20# calf raises    Other Machine rows 2x10 and lats x10      Knee/Hip Exercises: Standing   Walking with Sports Cord 30lb forward x3, 20lb backwards and side steps x3    Other Standing Knee Exercises  6 in step up at stairs c10 each    Other Standing Knee Exercises Alt 4 in box taps 2x10, lateral box taps x10 each all wiith SPC                    PT Short Term Goals - 01/14/20 1230      PT SHORT TERM GOAL #1   Title Pt will be I and compliant with initial HEP.    Status Achieved             PT Long Term Goals - 01/14/20 1231      PT LONG TERM GOAL #1   Title Pt will return to leisure exercise 3x/week.    Status On-going      PT LONG TERM GOAL #2   Title Pt will increase walking to 30 minutes at a time with or without AD.    Status On-going                 Plan - 01/25/20 1149    Clinical Impression Statement Pt did well overall. He reports better comfort today with resisted gait despite some instability. Forgot to apply topical pain cream to L shoulder so was only able to do one set of lat pull downs. RLE weakness present when doing lateral step the LLE    Personal Factors and Comorbidities Age;Fitness;Comorbidity 3+;Time since onset of  injury/illness/exacerbation;Past/Current Experience    Comorbidities Cancer, CHF, stroke    Examination-Activity Limitations Bend;Locomotion Level;Stairs;Stand;Squat;Other    Examination-Participation Restrictions Interpersonal Relationship;Community Activity;Shop;Laundry;Cleaning    Stability/Clinical Decision Making Evolving/Moderate complexity    Rehab Potential Good    PT Duration 8 weeks    PT Treatment/Interventions ADLs/Self Care Home Management;Moist Heat;Iontophoresis 4mg /ml Dexamethasone;Cryotherapy;Electrical Stimulation;Gait training;Stair training;Functional mobility training;Therapeutic activities;Neuromuscular re-education;Balance training;Therapeutic exercise;Patient/family education;DME Instruction;Manual techniques;Passive range of motion;Dry needling;Energy conservation    PT Next Visit Plan continue to progress as tolerated           Patient will benefit from skilled therapeutic intervention in order to improve the following deficits and impairments:  Abnormal gait, Decreased balance, Decreased endurance, Decreased mobility, Difficulty walking, Improper body mechanics, Impaired perceived functional ability, Decreased activity tolerance, Decreased strength, Increased fascial restricitons, Postural dysfunction, Hypomobility, Impaired sensation  Visit Diagnosis: Impairment of balance  Abnormal posture  Muscle weakness (generalized)     Problem List Patient Active Problem List   Diagnosis Date Noted  . Right inguinal hernia 11/14/2019  . Acute on chronic respiratory failure with hypoxia ([place])   . Diaphragmatic paralysis   . History of DVT of lower extremity   . Chronic congestive heart failure with left ventricular diastolic dysfunction ([place])   . Chronic atrial fibrillation ([place])     Scot Jun 01/25/2020, 11:51 AM  [place]. [place], [place], [address] Phone: [phone]   Fax:   [phone]  Name: Travis Brewer MRN: [id number] Date of Birth: January 27, 1935

## 2020-01-28 ENCOUNTER — Other Ambulatory Visit: Payer: Self-pay

## 2020-01-28 ENCOUNTER — Encounter: Payer: Self-pay | Admitting: Physical Therapy

## 2020-01-28 ENCOUNTER — Ambulatory Visit: Payer: Medicare Other | Admitting: Physical Therapy

## 2020-01-28 DIAGNOSIS — Z7409 Other reduced mobility: Secondary | ICD-10-CM

## 2020-01-28 DIAGNOSIS — R262 Difficulty in walking, not elsewhere classified: Secondary | ICD-10-CM

## 2020-01-28 DIAGNOSIS — M6281 Muscle weakness (generalized): Secondary | ICD-10-CM

## 2020-01-28 DIAGNOSIS — R293 Abnormal posture: Secondary | ICD-10-CM

## 2020-01-28 DIAGNOSIS — R2689 Other abnormalities of gait and mobility: Secondary | ICD-10-CM

## 2020-01-28 NOTE — Therapy (Signed)
[place]. [place], [place], [address] Phone: [phone]   Fax:  [phone]  Physical Therapy Treatment  Patient Details  Name: Travis Brewer MRN: [id number] Date of Birth: 1934/09/19 Referring Provider (PT): Willeen Niece, [place]   Encounter Date: 01/28/2020   PT End of Session - 01/28/20 1223    Visit Number 9    Date for PT Re-Evaluation 03/04/20    Authorization Type Medicare    PT Start Time 0932    PT Stop Time 1229    PT Time Calculation (min) 44 min    Activity Tolerance Patient tolerated treatment well           Past Medical History:  Diagnosis Date  . Acute on chronic respiratory failure with hypoxia ([place])   . BPH (benign prostatic hyperplasia)   . CAD (coronary artery disease)   . Cancer ([place])    Melonoma on left shoulder blade removed  . Cerebral amyloid angiopathy ([place])   . Chronic atrial fibrillation ([place])   . Chronic congestive heart failure with left ventricular diastolic dysfunction ([place])   . Diaphragmatic paralysis   . Dyspnea   . Dysrhythmia   . ED (erectile dysfunction)   . Hepatitis    Hep A  . History of DVT of lower extremity    BLE DVT ~ 04/2019  . Pneumonia   . 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 ASCENDING AORTIC ANEURYSM REPAIR  02/22/2019   32 mm Hemashild graft, STJ to ascending interposition graft, resuspension of AV [facility])  . BACK SURGERY    . CARDIAC CATHETERIZATION  02/15/2019   [facility]  . CORONARY ARTERY BYPASS GRAFT  02/22/2019   LIMA to LAD, SVG to D1 [facility])  . EYE SURGERY Bilateral    Cataract surgery  . INGUINAL HERNIA REPAIR Right 11/14/2019   Procedure: RIGHT INGUINAL HERNIA REPAIR WITH MESH;  Surgeon: Donnie Mesa, MD;  Location: [place];  Service: General;  Laterality: Right;  LMA AND TAP BLOCK  . JOINT REPLACEMENT Bilateral    knee  replacement  . L3-L4 Back surgery    . PEG TUBE PLACEMENT    . sciatic nerve cyst removal    . TONSILLECTOMY    . TRACHEOSTOMY      There were no vitals filed for this visit.   Subjective Assessment - 01/28/20 1147    Subjective "Good""    Currently in Pain? Yes    Pain Score 4     Pain Location Shoulder    Pain Orientation Left                             [facility] Adult PT Treatment/Exercise - 01/28/20 0001      Knee/Hip Exercises: Aerobic   Recumbent Bike L1.5 x 5 min     Nustep L5 x 6 min      Knee/Hip Exercises: Machines for Strengthening   Cybex Knee Extension 10lb 2x15    Cybex Knee Flexion 25lb 2x15    Cybex Leg Press 50# 3x12, 30# calf raises    Other Machine rows and lats 20# 2x10      Knee/Hip Exercises: Standing   Other Standing Knee Exercises Alt 6in box taps x10 each, then on airex pad  x 10 each  PT Short Term Goals - 01/14/20 1230      PT SHORT TERM GOAL #1   Title Pt will be I and compliant with initial HEP.    Status Achieved             PT Long Term Goals - 01/28/20 1200      PT LONG TERM GOAL #2   Title Pt will increase walking to 30 minutes at a time with or without AD.    Status On-going      PT LONG TERM GOAL #3   Title Pt will decrease TUG time to <13 seconds to indicate decreased fall risk.    Status Partially Met      PT LONG TERM GOAL #4   Title Pt will decrease 5XSTS time to 10 seconds or less, demonstrating increase BLE power production.    Status Achieved   7.83 sec                Plan - 01/28/20 1223    Clinical Impression Statement Pt has progressed increasing his five time sit to stand goal. Increase resistance tolerated on leg press without issue. L shoulder pain remains with lat pull downs but able to complete two sets He has some instability standing on airex performing 6 in box taps. Cues needed to fully extend LE's with extensions.    Personal Factors and  Comorbidities Age;Fitness;Comorbidity 3+;Time since onset of injury/illness/exacerbation;Past/Current Experience    Comorbidities Cancer, CHF, stroke    Examination-Activity Limitations Bend;Locomotion Level;Stairs;Stand;Squat;Other    Examination-Participation Restrictions Interpersonal Relationship;Community Activity;Shop;Laundry;Cleaning    Stability/Clinical Decision Making Evolving/Moderate complexity    Rehab Potential Good    PT Duration 8 weeks    PT Treatment/Interventions ADLs/Self Care Home Management;Moist Heat;Iontophoresis 25m/ml Dexamethasone;Cryotherapy;Electrical Stimulation;Gait training;Stair training;Functional mobility training;Therapeutic activities;Neuromuscular re-education;Balance training;Therapeutic exercise;Patient/family education;DME Instruction;Manual techniques;Passive range of motion;Dry needling;Energy conservation    PT Next Visit Plan continue to progress as tolerated           Patient will benefit from skilled therapeutic intervention in order to improve the following deficits and impairments:  Abnormal gait, Decreased balance, Decreased endurance, Decreased mobility, Difficulty walking, Improper body mechanics, Impaired perceived functional ability, Decreased activity tolerance, Decreased strength, Increased fascial restricitons, Postural dysfunction, Hypomobility, Impaired sensation  Visit Diagnosis: Abnormal posture  Muscle weakness (generalized)  Impairment of balance  Difficulty in walking, not elsewhere classified  Impaired functional mobility, balance, gait, and endurance     Problem List Patient Active Problem List   Diagnosis Date Noted  . Right inguinal hernia 11/14/2019  . Acute on chronic respiratory failure with hypoxia ([place]   . Diaphragmatic paralysis   . History of DVT of lower extremity   . Chronic congestive heart failure with left ventricular diastolic dysfunction ([place]   . Chronic atrial fibrillation ([place]     RScot Jun PTA 01/28/2020, 12:27 PM  [place] [address]Phone: [phone]  Fax:  [phone] Name: Travis RYLANDMRN: [id number] of Birth: 1February 20, 1936

## 2020-01-30 ENCOUNTER — Other Ambulatory Visit: Payer: Self-pay

## 2020-01-30 ENCOUNTER — Encounter: Payer: Self-pay | Admitting: Physical Therapy

## 2020-01-30 ENCOUNTER — Ambulatory Visit: Payer: Medicare Other | Admitting: Physical Therapy

## 2020-01-30 ENCOUNTER — Encounter: Payer: Self-pay | Admitting: Internal Medicine

## 2020-01-30 DIAGNOSIS — R262 Difficulty in walking, not elsewhere classified: Secondary | ICD-10-CM

## 2020-01-30 DIAGNOSIS — M6281 Muscle weakness (generalized): Secondary | ICD-10-CM | POA: Diagnosis not present

## 2020-01-30 DIAGNOSIS — Z7409 Other reduced mobility: Secondary | ICD-10-CM

## 2020-01-30 DIAGNOSIS — R293 Abnormal posture: Secondary | ICD-10-CM

## 2020-01-30 DIAGNOSIS — R2689 Other abnormalities of gait and mobility: Secondary | ICD-10-CM

## 2020-01-30 LAB — BASIC METABOLIC PANEL
BUN/Creatinine Ratio: 19 (ref 10–24)
BUN: 20 mg/dL (ref 8–27)
CO2: 25 mmol/L (ref 20–29)
Calcium: 9.5 mg/dL (ref 8.6–10.2)
Chloride: 100 mmol/L (ref 96–106)
Creatinine, Ser: 1.07 mg/dL (ref 0.76–1.27)
GFR calc Af Amer: 74 mL/min/{1.73_m2} (ref >59)
GFR calc non Af Amer: 64 mL/min/{1.73_m2} (ref >59)
Glucose: 81 mg/dL (ref 65–99)
Potassium: 4.3 mmol/L (ref 3.5–5.2)
Sodium: 139 mmol/L (ref 134–144)

## 2020-01-30 LAB — BRAIN NATRIURETIC PEPTIDE: BNP: 219.3 pg/mL — ABNORMAL HIGH (ref 0.0–100.0)

## 2020-01-30 NOTE — Therapy (Signed)
[place] [place], [place], [address] Phone: [phone]   Fax:  [phone]  Physical Therapy Treatment Progress Note Reporting Period 01/08/2020 to 01/30/2020  See note below for Objective Data and Assessment of Progress/Goals.      Patient Details  Name: Travis [place] MRN: [id number] Date of Birth: October 07, 1934 Referring Provider (PT): Willeen Niece, [place]   Encounter Date: 01/30/2020   PT End of Session - 01/30/20 1143    Visit Number 10    Number of Visits 16    Date for PT Re-Evaluation 03/04/20    PT Start Time 1059    PT Stop Time 1142    PT Time Calculation (min) 43 min    Activity Tolerance Patient tolerated treatment well    Behavior During Therapy Travis [place] for tasks assessed/performed           Past Medical History:  Diagnosis Date  . Acute on chronic respiratory failure with hypoxia ([place])   . BPH (benign prostatic hyperplasia)   . CAD (coronary artery disease)   . Cancer ([place])    Melonoma on left shoulder blade removed  . Cerebral amyloid angiopathy ([place])   . Chronic atrial fibrillation ([place])   . Chronic congestive heart failure with left ventricular diastolic dysfunction ([place])   . Diaphragmatic paralysis   . Dyspnea   . Dysrhythmia   . ED (erectile dysfunction)   . Hepatitis    Hep A  . History of DVT of lower extremity    BLE DVT ~ 04/2019  . Pneumonia   . 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 ASCENDING AORTIC ANEURYSM REPAIR  02/22/2019   32 mm Hemashild graft, STJ to ascending interposition graft, resuspension of AV [facility])  . BACK SURGERY    . CARDIAC CATHETERIZATION  02/15/2019   [facility]  . CORONARY ARTERY BYPASS GRAFT  02/22/2019   LIMA to LAD, SVG to D1 [facility])  . EYE SURGERY Bilateral    Cataract surgery  . INGUINAL HERNIA REPAIR Right 11/14/2019   Procedure: RIGHT  INGUINAL HERNIA REPAIR WITH MESH;  Surgeon: Donnie Mesa, MD;  Location: [place];  Service: General;  Laterality: Right;  LMA AND TAP BLOCK  . JOINT REPLACEMENT Bilateral    knee replacement  . L3-L4 Back surgery    . PEG TUBE PLACEMENT    . sciatic nerve cyst removal    . TONSILLECTOMY    . TRACHEOSTOMY      There were no vitals filed for this visit.   Subjective Assessment - 01/30/20 1104    Subjective Pt reports shoulder bothering him but doing well overall    Currently in Pain? Yes    Pain Score 4     Pain Location Shoulder    Pain Orientation Left                             [facility] Adult PT Treatment/Exercise - 01/30/20 0001      Knee/Hip Exercises: Aerobic   Recumbent Bike L2 x 6 min    Nustep L5 x 6 min      Knee/Hip Exercises: Machines for Strengthening   Cybex Knee Extension 10lb 2x15    Cybex Knee Flexion 25lb 2x15    Cybex Leg Press 50# 3x10 BLE; 30# calf raises 2x15    Other  Machine rows and lats 20# 2x10      Knee/Hip Exercises: Standing   Other Standing Knee Exercises 6 in step ups 2x5 B    Other Standing Knee Exercises alt 6" step taps no HH assist                    PT Short Term Goals - 01/14/20 1230      PT SHORT TERM GOAL #1   Title Pt will be I and compliant with initial HEP.    Status Achieved             PT Long Term Goals - 01/28/20 1200      PT LONG TERM GOAL #2   Title Pt will increase walking to 30 minutes at a time with or without AD.    Status On-going      PT LONG TERM GOAL #3   Title Pt will decrease TUG time to <13 seconds to indicate decreased fall risk.    Status Partially Met      PT LONG TERM GOAL #4   Title Pt will decrease 5XSTS time to 10 seconds or less, demonstrating increase BLE power production.    Status Achieved   7.83 sec                Plan - 01/30/20 1144    Clinical Impression Statement Pt progressing towards goals; still demos wide BOS with gait and occasional  instability particularly with transition movements and transfers. Increased stability with step taps requiring only occasional CGA. L shoulder pain remains a limiting factor.    PT Treatment/Interventions ADLs/Self Care Home Management;Moist Heat;Iontophoresis 28m/ml Dexamethasone;Cryotherapy;Electrical Stimulation;Gait training;Stair training;Functional mobility training;Therapeutic activities;Neuromuscular re-education;Balance training;Therapeutic exercise;Patient/family education;DME Instruction;Manual techniques;Passive range of motion;Dry needling;Energy conservation    PT Next Visit Plan continue to progress as tolerated    Consulted and Agree with Plan of Care Patient           Patient will benefit from skilled therapeutic intervention in order to improve the following deficits and impairments:  Abnormal gait, Decreased balance, Decreased endurance, Decreased mobility, Difficulty walking, Improper body mechanics, Impaired perceived functional ability, Decreased activity tolerance, Decreased strength, Increased fascial restricitons, Postural dysfunction, Hypomobility, Impaired sensation  Visit Diagnosis: Abnormal posture  Muscle weakness (generalized)  Impairment of balance  Difficulty in walking, not elsewhere classified  Impaired functional mobility, balance, gait, and endurance     Problem List Patient Active Problem List   Diagnosis Date Noted  . Right inguinal hernia 11/14/2019  . Acute on chronic respiratory failure with hypoxia ([place]   . Diaphragmatic paralysis   . History of DVT of lower extremity   . Chronic congestive heart failure with left ventricular diastolic dysfunction ([place]   . Chronic atrial fibrillation ([facility]    AAmador Cunas PT, DPT ADonald ProseSugg 01/30/2020, 11:46 AM  [place] [address]Phone: [phone]  Fax:  [phone] Name: WSADAT SLIWAMRN:  [id number] of Birth: [birth year]

## 2020-02-01 ENCOUNTER — Ambulatory Visit: Payer: Medicare Other | Admitting: Physical Therapy

## 2020-02-01 ENCOUNTER — Encounter: Payer: Self-pay | Admitting: Physical Therapy

## 2020-02-01 ENCOUNTER — Other Ambulatory Visit: Payer: Self-pay

## 2020-02-01 DIAGNOSIS — Z7409 Other reduced mobility: Secondary | ICD-10-CM

## 2020-02-01 DIAGNOSIS — R262 Difficulty in walking, not elsewhere classified: Secondary | ICD-10-CM

## 2020-02-01 DIAGNOSIS — R293 Abnormal posture: Secondary | ICD-10-CM

## 2020-02-01 DIAGNOSIS — R2689 Other abnormalities of gait and mobility: Secondary | ICD-10-CM

## 2020-02-01 DIAGNOSIS — M6281 Muscle weakness (generalized): Secondary | ICD-10-CM

## 2020-02-01 NOTE — Therapy (Signed)
[place]. [place], [place], [address] Phone: [phone]   Fax:  [phone]  Physical Therapy Treatment  Patient Details  Name: Travis [place] MRN: [id number] Date of Birth: 10/14/1934 Referring Provider (PT): Willeen Niece, [place]   Encounter Date: 02/01/2020   PT End of Session - 02/01/20 1058    Visit Number 11    Number of Visits 16    Date for PT Re-Evaluation 03/04/20    Authorization Type Medicare    PT Start Time 5400    PT Stop Time 1055    PT Time Calculation (min) 40 min    Activity Tolerance Patient tolerated treatment well    Behavior During Therapy [facility] for tasks assessed/performed           Past Medical History:  Diagnosis Date  . Acute on chronic respiratory failure with hypoxia ([place])   . BPH (benign prostatic hyperplasia)   . CAD (coronary artery disease)   . Cancer ([place])    Melonoma on left shoulder blade removed  . Cerebral amyloid angiopathy ([place])   . Chronic atrial fibrillation ([place])   . Chronic congestive heart failure with left ventricular diastolic dysfunction ([place])   . Diaphragmatic paralysis   . Dyspnea   . Dysrhythmia   . ED (erectile dysfunction)   . Hepatitis    Hep A  . History of DVT of lower extremity    BLE DVT ~ 04/2019  . Pneumonia   . 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 ASCENDING AORTIC ANEURYSM REPAIR  02/22/2019   32 mm Hemashild graft, STJ to ascending interposition graft, resuspension of AV [facility])  . BACK SURGERY    . CARDIAC CATHETERIZATION  02/15/2019   [facility]  . CORONARY ARTERY BYPASS GRAFT  02/22/2019   LIMA to LAD, SVG to D1 [facility])  . EYE SURGERY Bilateral    Cataract surgery  . INGUINAL HERNIA REPAIR Right 11/14/2019   Procedure: RIGHT INGUINAL HERNIA REPAIR WITH MESH;  Surgeon: Donnie Mesa, MD;  Location: [place];  Service: General;   Laterality: Right;  LMA AND TAP BLOCK  . JOINT REPLACEMENT Bilateral    knee replacement  . L3-L4 Back surgery    . PEG TUBE PLACEMENT    . sciatic nerve cyst removal    . TONSILLECTOMY    . TRACHEOSTOMY      There were no vitals filed for this visit.   Subjective Assessment - 02/01/20 1014    Subjective "Everything is fine except for this L shoulder"    Currently in Pain? No/denies    Pain Score 4     Pain Location Shoulder    Pain Orientation Left                             [facility] Adult PT Treatment/Exercise - 02/01/20 0001      High Level Balance   High Level Balance Comments on airex ball toss, posteriorl lean with ankle strategies, On airex eyes closws 10 sec x3,  tandem stance 2x30 ea direction, DL stance airex with alternating head movements (L,R, up, down)       Knee/Hip Exercises: Aerobic   Nustep L5 x 6 min      Knee/Hip Exercises: Standing   Walking with Sports Cord 30lb forward x3, 20lb backwards and side  steps x3    Other Standing Knee Exercises Green Tband rows green 2x10       Knee/Hip Exercises: Seated   Other Seated Knee/Hip Exercises Biceps curls 4lb 2x10     Sit to Sand 2 sets;10 reps;without UE support   Holding yellow ball, Then staggard stance R back 2x10                    PT Short Term Goals - 01/14/20 1230      PT SHORT TERM GOAL #1   Title Pt will be I and compliant with initial HEP.    Status Achieved             PT Long Term Goals - 01/28/20 1200      PT LONG TERM GOAL #2   Title Pt will increase walking to 30 minutes at a time with or without AD.    Status On-going      PT LONG TERM GOAL #3   Title Pt will decrease TUG time to <13 seconds to indicate decreased fall risk.    Status Partially Met      PT LONG TERM GOAL #4   Title Pt will decrease 5XSTS time to 10 seconds or less, demonstrating increase BLE power production.    Status Achieved   7.83 sec                Plan - 02/01/20 1059      Clinical Impression Statement Patient presented to clinic with increased shoulder pain, cuing during STS needed to place more weight through R LE, demonstrated overall less fatigue with resisted walk-outs but still required a rest break to recover, needed verbal and tactile cuing for balance activities to recruit proper righting and ankle strategies needed to prevent LOB.    Personal Factors and Comorbidities Age;Fitness;Comorbidity 3+;Time since onset of injury/illness/exacerbation;Past/Current Experience    Comorbidities Cancer, CHF, stroke    Examination-Activity Limitations Bend;Locomotion Level;Stairs;Stand;Squat;Other    Examination-Participation Restrictions Interpersonal Relationship;Community Activity;Shop;Laundry;Cleaning    Stability/Clinical Decision Making Evolving/Moderate complexity    Rehab Potential Good    PT Duration 8 weeks    PT Treatment/Interventions ADLs/Self Care Home Management;Moist Heat;Iontophoresis 42m/ml Dexamethasone;Cryotherapy;Electrical Stimulation;Gait training;Stair training;Functional mobility training;Therapeutic activities;Neuromuscular re-education;Balance training;Therapeutic exercise;Patient/family education;DME Instruction;Manual techniques;Passive range of motion;Dry needling;Energy conservation    PT Next Visit Plan continue to progress as tolerated, I gym program           Patient will benefit from skilled therapeutic intervention in order to improve the following deficits and impairments:  Abnormal gait, Decreased balance, Decreased endurance, Decreased mobility, Difficulty walking, Improper body mechanics, Impaired perceived functional ability, Decreased activity tolerance, Decreased strength, Increased fascial restricitons, Postural dysfunction, Hypomobility, Impaired sensation  Visit Diagnosis: Abnormal posture  Muscle weakness (generalized)  Impairment of balance  Difficulty in walking, not elsewhere classified  Impaired functional  mobility, balance, gait, and endurance     Problem List Patient Active Problem List   Diagnosis Date Noted  . Right inguinal hernia 11/14/2019  . Acute on chronic respiratory failure with hypoxia ([place]   . Diaphragmatic paralysis   . History of DVT of lower extremity   . Chronic congestive heart failure with left ventricular diastolic dysfunction ([place]   . Chronic atrial fibrillation ([place]     RScot Jun PTA 02/01/2020, 11:06 AM  [place] [address]Phone: [phone]  Fax:  [phone] Name: WMarella [place]er  MRN: [id number] Date of Birth: 06/11/1934

## 2020-02-06 ENCOUNTER — Ambulatory Visit: Payer: Medicare Other | Admitting: Physical Therapy

## 2020-02-08 ENCOUNTER — Encounter: Payer: Self-pay | Admitting: Physical Therapy

## 2020-02-08 ENCOUNTER — Ambulatory Visit: Payer: Medicare Other | Admitting: Physical Therapy

## 2020-02-08 ENCOUNTER — Other Ambulatory Visit: Payer: Self-pay

## 2020-02-08 DIAGNOSIS — R293 Abnormal posture: Secondary | ICD-10-CM

## 2020-02-08 DIAGNOSIS — Z7409 Other reduced mobility: Secondary | ICD-10-CM

## 2020-02-08 DIAGNOSIS — M6281 Muscle weakness (generalized): Secondary | ICD-10-CM

## 2020-02-08 DIAGNOSIS — R262 Difficulty in walking, not elsewhere classified: Secondary | ICD-10-CM

## 2020-02-08 DIAGNOSIS — R2689 Other abnormalities of gait and mobility: Secondary | ICD-10-CM

## 2020-02-08 NOTE — Therapy (Signed)
[place]. [place], [place], [address] Phone: [phone]   Fax:  [phone]  Physical Therapy Treatment  Patient Details  Name: Travis Brewer MRN: [id number] Date of Birth: [birth year] Referring Provider (PT): Willeen Niece, [place]   Encounter Date: 02/08/2020   PT End of Session - 02/08/20 1049    Visit Number 12    Date for PT Re-Evaluation 03/04/20    Authorization Type Medicare    PT Start Time 1010    PT Stop Time 1049    PT Time Calculation (min) 39 min    Activity Tolerance Patient tolerated treatment well           Past Medical History:  Diagnosis Date  . Acute on chronic respiratory failure with hypoxia ([place])   . BPH (benign prostatic hyperplasia)   . CAD (coronary artery disease)   . Cancer ([place])    Melonoma on left shoulder blade removed  . Cerebral amyloid angiopathy ([place])   . Chronic atrial fibrillation ([place])   . Chronic congestive heart failure with left ventricular diastolic dysfunction ([place])   . Diaphragmatic paralysis   . Dyspnea   . Dysrhythmia   . ED (erectile dysfunction)   . Hepatitis    Hep A  . History of DVT of lower extremity    BLE DVT ~ 04/2019  . Pneumonia   . 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 ASCENDING AORTIC ANEURYSM REPAIR  02/22/2019   32 mm Hemashild graft, STJ to ascending interposition graft, resuspension of AV [facility])  . BACK SURGERY    . CARDIAC CATHETERIZATION  02/15/2019   [facility]  . CORONARY ARTERY BYPASS GRAFT  02/22/2019   LIMA to LAD, SVG to D1 [facility])  . EYE SURGERY Bilateral    Cataract surgery  . INGUINAL HERNIA REPAIR Right 11/14/2019   Procedure: RIGHT INGUINAL HERNIA REPAIR WITH MESH;  Surgeon: Donnie Mesa, MD;  Location: [facility];  Service: General;  Laterality: Right;  LMA AND TAP BLOCK  . JOINT REPLACEMENT Bilateral    knee  replacement  . L3-L4 Back surgery    . PEG TUBE PLACEMENT    . sciatic nerve cyst removal    . TONSILLECTOMY    . TRACHEOSTOMY      There were no vitals filed for this visit.   Subjective Assessment - 02/08/20 1015    Subjective Feeling ok    Currently in Pain? Yes    Pain Score 4     Pain Location Shoulder    Pain Orientation Left                             [facility] Adult PT Treatment/Exercise - 02/08/20 0001      Knee/Hip Exercises: Aerobic   Recumbent Bike L2 x 6 min    Nustep L2 x 5 min      Knee/Hip Exercises: Machines for Strengthening   Cybex Knee Extension 10lb 3x10    Cybex Knee Flexion 25lb 2x10     Cybex Leg Press 40lb 3x10, Heel raises 40lb 2x10     Other Machine rows 20# 2x10                    PT Short Term Goals - 01/14/20 1230      PT SHORT TERM GOAL #  1   Title Pt will be I and compliant with initial HEP.    Status Achieved             PT Long Term Goals - 02/08/20 1053      PT LONG TERM GOAL #1   Title Pt will return to leisure exercise 3x/week.    Status Partially Met      PT LONG TERM GOAL #2   Title Pt will increase walking to 30 minutes at a time with or without AD.    Status Partially Met      PT LONG TERM GOAL #3   Title Pt will decrease TUG time to <13 seconds to indicate decreased fall risk.    Status Partially Met      PT LONG TERM GOAL #4   Title Pt will decrease 5XSTS time to 10 seconds or less, demonstrating increase BLE power production.    Status Achieved                 Plan - 02/08/20 1050    Clinical Impression Statement Pt performed all machine level interventions well. Pt received detailed instructions on how to set up machine to prepare to independent program. Pt is pleased with his current functional status and is progressing to independent gym program    Personal Factors and Comorbidities Age;Fitness;Comorbidity 3+;Time since onset of injury/illness/exacerbation;Past/Current  Experience    Comorbidities Cancer, CHF, stroke    Examination-Activity Limitations Bend;Locomotion Level;Stairs;Stand;Squat;Other    Examination-Participation Restrictions Interpersonal Relationship;Community Activity;Shop;Laundry;Cleaning    Stability/Clinical Decision Making Evolving/Moderate complexity    Rehab Potential Good    PT Duration 8 weeks    PT Treatment/Interventions ADLs/Self Care Home Management;Moist Heat;Iontophoresis 41m/ml Dexamethasone;Cryotherapy;Electrical Stimulation;Gait training;Stair training;Functional mobility training;Therapeutic activities;Neuromuscular re-education;Balance training;Therapeutic exercise;Patient/family education;DME Instruction;Manual techniques;Passive range of motion;Dry needling;Energy conservation    PT Next Visit Plan D/C PT           Patient will benefit from skilled therapeutic intervention in order to improve the following deficits and impairments:  Abnormal gait, Decreased balance, Decreased endurance, Decreased mobility, Difficulty walking, Improper body mechanics, Impaired perceived functional ability, Decreased activity tolerance, Decreased strength, Increased fascial restricitons, Postural dysfunction, Hypomobility, Impaired sensation  Visit Diagnosis: Muscle weakness (generalized)  Impairment of balance  Difficulty in walking, not elsewhere classified  Impaired functional mobility, balance, gait, and endurance  Abnormal posture     Problem List Patient Active Problem List   Diagnosis Date Noted  . Right inguinal hernia 11/14/2019  . Acute [place] chronic respiratory failure with hypoxia ([place]   . Diaphragmatic paralysis   . History of DVT of lower extremity   . Chronic congestive heart failure with left ventricular diastolic dysfunction ([place]   . Chronic atrial fibrillation ([place]    PHYSICAL THERAPY DISCHARGE SUMMARY  Visits from Start of Care: 12  Plan: Patient agrees to discharge.  Patient goals were partially  met. Patient is being discharged due to being pleased with the current functional level.  ?????       RScot Jun PTA 02/08/2020, 10:54 AM  [place] [address]Phone: [phone]  Fax:  [phone] Name: WRAYE SLYTERMRN: [id number] of Birth: [birth year]

## 2020-03-10 ENCOUNTER — Other Ambulatory Visit: Payer: Self-pay

## 2020-03-10 ENCOUNTER — Ambulatory Visit (INDEPENDENT_AMBULATORY_CARE_PROVIDER_SITE_OTHER): Payer: Medicare Other | Admitting: Internal Medicine

## 2020-03-10 ENCOUNTER — Encounter: Payer: Self-pay | Admitting: Internal Medicine

## 2020-03-10 VITALS — BP 128/70 | HR 62 | Ht 67.0 in | Wt 160.0 lb

## 2020-03-10 DIAGNOSIS — I2510 Atherosclerotic heart disease of native coronary artery without angina pectoris: Secondary | ICD-10-CM

## 2020-03-10 DIAGNOSIS — R0681 Apnea, not elsewhere classified: Secondary | ICD-10-CM

## 2020-03-10 DIAGNOSIS — Z951 Presence of aortocoronary bypass graft: Secondary | ICD-10-CM | POA: Diagnosis not present

## 2020-03-10 DIAGNOSIS — Z8679 Personal history of other diseases of the circulatory system: Secondary | ICD-10-CM

## 2020-03-10 DIAGNOSIS — Z9889 Other specified postprocedural states: Secondary | ICD-10-CM

## 2020-03-10 DIAGNOSIS — I272 Pulmonary hypertension, unspecified: Secondary | ICD-10-CM

## 2020-03-10 DIAGNOSIS — I4811 Longstanding persistent atrial fibrillation: Secondary | ICD-10-CM | POA: Diagnosis not present

## 2020-03-10 DIAGNOSIS — I071 Rheumatic tricuspid insufficiency: Secondary | ICD-10-CM

## 2020-03-10 NOTE — Progress Notes (Signed)
OFFICE NOTE  Chief Complaint:  Follow-up echo  Primary Care Physician: Willeen Niece, PA  HPI:  Travis Brewer is a 85 y.o. male with a past medial history significant for recent diagnosis of ascending aortic aneurysm status post urgent elective surgery for aneurysm repair and possible replacement of a bicuspid aortic valve.  He underwent surgery at [facility] on 02/22/2019, undergoing an aneurysm replacement, resuspension of the aortic valve and CABG x2 with LIMA to LAD, SVG to D1.  Surgery was complicated by cardiac arrest that occurred shortly after sternotomy thought to be related to air embolism.  Ultimately the patient was successfully placed on bypass, the groin was explored and thought to be the source of air introduction during vein harvest.  Surgery then proceeded as scheduled however postoperatively was complicated by atrial fibrillation, 2 strokes, failure to wean off of the ventilator and ultimate placement of a trach and PEG.  He was subsequently transferred to the [facility] in [place] and spent most of March 2021 there, ultimately eventually being weaned off of the ventilator and having his PEG tube reversed.  He presents today with his wife is also a new patient of mine to establish cardiology care.  He was seen in the hospital by Dr. Doylene Canard, one of the local cardiologist, who had added digoxin for aid in A. fib rate control as well as possible RV inotropic stimulation.  He noted that the patient's chads vas score was 5 however anticoagulation was not recommended.  In fact I cannot understand why the patient has not been anticoagulated as there is no history of significant contraindication to this, but he did have 2 strokes and has had what appears to be persistent atrial fibrillation since surgery.  There is no evidence of left atrial appendage closure in fact it seemed that the surgery went pretty emergently after his arrest.  Recently Travis Brewer has been  complaining of positional dizziness, especially when bending over and sitting up too quickly or turning over to one side.  Blood pressure today was noted to be low at 104/72.  EKG showed that he is in A. fib with controlled ventricular response at 70-personally reviewed.  Other than this he reports he is slowly getting back to somewhat normal activities.  He did undergo some speech therapy.  He had an echo that was performed prior to bypass which showed normal LVEF and a subsequent echo in January which showed preserved LV function.  He denies any MI during the hospitalization.  He has no chest pain or shortness of breath.  He reports fatigue but it is difficult to know whether this is related to his A. fib.  The last echo in January while showing normal LVEF did show moderate to severe biatrial enlargement, suggesting that his A. fib may be challenging to rhythm control.  12/25/2019  Travis Brewer returns today for follow-up.  He recently underwent uncomplicated mesh hernia repair by Dr. Georgette Dover which went very well.  Apparently was discharged same day.  He has done well recovering.  He is interested in getting reestablished with some physical therapy to improve his strength.  He did undergo a repeat EF 60 to 65% without regional wall motion abnormalities however there are signs of right ventricular volume overload with mildly elevated pulmonary artery systolic pressure and RVSP of 38 mmHg.  The left atrium is mild to moderately dilated and the right atrium is severely dilated.  There is mild to moderate MR and severe  TR.  There is mild AI.  The aortic root is dilated at 42 mm without evidence of a sending aortic dilatation.  The ascending aorta as mentioned was grafted.  The IVC was dilated with elevated right atrial pressure 15 mmHg.  Despite these echo findings, Travis Brewer says he feels well.  He denies any shortness of breath with exertion, chest pain or associated symptoms.  He is not currently anticoagulated.  We  previously discussed going on Eliquis for anticoagulation however he is concerned about the risk for intracranial bleeding.  He remains on low-dose aspirin however this is not sufficient to protect him from stroke.  We discussed alternatives to anticoagulation including possible left atrial appendage occlusion with a watchman device, however he wished to consider that further and is not interested in anticoagulation at this time.  03/10/2020  Travis Brewer is seen today in follow-up.  Echo in September showed normal LVEF 60 to 65% with signs of RV volume overload.  There was only mild pulmonary hypertension with an RVSP of 38 mmHg.  There was mild to moderate left atrial enlargement and severe right atrial enlargement with mild to moderate MR and severe TR.  The aortic root was dilated at 4.2 cm.  He continues to have no complaints of worsening shortness of breath.  He was noted to have mildly elevated BNP and I had recommended increasing his Lasix up to 40 mg daily.  He seems to be tolerating this and had no significant change in his creatinine with the increase in the Lasix.  We again discussed the fact that he is not anticoagulated.  He is in persistent if not longstanding persistent atrial fibrillation.  He had concerns about anticoagulation and is not willing to take it due to his prior risk of intracranial bleeding.  We again discussed the watchman procedure and he is now interested in further evaluation.  Also, his wife who was at the visit today mentioned that he had been noticed to have witnessed episodes of apnea.  It also sounds like possibly a Cheyne Stokes pattern of respiration where she said after about every 8 or 9 breaths he had stopped breathing.  Reportedly he had a sleep study in [place] but never got the results of that.  PMHx:  Past Medical History:  Diagnosis Date  . Acute on chronic respiratory failure with hypoxia ([place])   . BPH (benign prostatic hyperplasia)   . CAD (coronary artery  disease)   . Cancer ([place])    Melonoma on left shoulder blade removed  . Cerebral amyloid angiopathy ([place])   . Chronic atrial fibrillation ([place])   . Chronic congestive heart failure with left ventricular diastolic dysfunction ([place])   . Diaphragmatic paralysis   . Dyspnea   . Dysrhythmia   . ED (erectile dysfunction)   . Hepatitis    Hep A  . History of DVT of lower extremity    BLE DVT ~ 04/2019  . Pneumonia   . 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 Diabetes Mother   . Stroke Father     SOCHx:   reports that he has never smoked. He has never used smokeless tobacco. He reports that he does not use drugs. No history on file for alcohol use.  ALLERGIES:  Allergies  Allergen Reactions  . Quetiapine Nausea And Vomiting  . Clonazepam Nausea Only    ROS: Pertinent items noted  in HPI and remainder of comprehensive ROS otherwise negative.  HOME MEDS: Current Outpatient Medications on File Prior to Visit  Medication Sig Dispense Refill  . acetaminophen (TYLENOL) 500 MG tablet Take 1,000 mg by mouth every 8 (eight) hours as needed for moderate pain.    . Ascorbic Acid (VITAMIN C) 500 MG CAPS Take 500 mg by mouth daily.     Marland Kitchen aspirin 81 MG chewable tablet Chew 81 mg by mouth daily.    Marland Kitchen atorvastatin (LIPITOR) 80 MG tablet Take 1 tablet (80 mg total) by mouth daily. 90 tablet 3  . diclofenac Sodium (VOLTAREN) 1 % GEL Apply 1 application topically 3 (three) times daily as needed (pain).    . furosemide (LASIX) 40 MG tablet Take 1 tablet (40 mg total) by mouth daily. 90 tablet 3  . linezolid (ZYVOX) 600 MG tablet SMARTSIG:1 Tablet(s) By Mouth Every 12 Hours    . metoprolol tartrate (LOPRESSOR) 25 MG tablet Take 25 mg by mouth 2 (two) times daily.     . Multiple Vitamins-Minerals (MULTIVITAMIN ADULT EXTRA C PO) Take 1 tablet by mouth daily.    . Multiple Vitamins-Minerals (OCUVITE ADULT FORMULA PO) Take 1 tablet by  mouth daily.    Marland Kitchen omeprazole (PRILOSEC) 40 MG capsule Take 40 mg by mouth daily.    . ondansetron (ZOFRAN-ODT) 4 MG disintegrating tablet Take 4 mg by mouth every 6 (six) hours as needed.    . sildenafil (VIAGRA) 25 MG tablet Take 1 to 2 tablets by mouth as needed before sexual intercourse.    . tamsulosin (FLOMAX) 0.4 MG CAPS capsule Take 0.4 mg by mouth daily.      No current facility-administered medications on file prior to visit.    LABS/IMAGING: No results found for this or any previous visit (from the past 48 hour(s)). No results found.  LIPID PANEL: No results found for: CHOL, TRIG, HDL, CHOLHDL, VLDL, LDLCALC, LDLDIRECT   WEIGHTS: Wt Readings from Last 3 Encounters:  03/10/20 160 lb (72.6 kg)  12/25/19 154 lb (69.9 kg)  11/14/19 151 lb 8 oz (68.7 kg)    VITALS: BP 128/70   Pulse 62   Ht 5\' 7"  (1.702 m)   Wt 160 lb (72.6 kg)   SpO2 96%   BMI 25.06 kg/m   EXAM: General appearance: alert, no distress and pale Neck: no carotid bruit, no JVD and thyroid not enlarged, symmetric, no tenderness/mass/nodules Lungs: clear to auscultation bilaterally Heart: irregularly irregular rhythm Abdomen: soft, non-tender; bowel sounds normal; no masses,  no organomegaly Extremities: extremities normal, atraumatic, no cyanosis or edema Pulses: 2+ and symmetric Skin: Skin color, texture, turgor normal. No rashes or lesions Neurologic: Grossly normal Psych: Pleasant  EKG: A. fib at 62, incomplete right bundle branch block with nonspecific T wave changes-personally reviewed  ASSESSMENT: 1. Status post ascending aneurysm replacement (02/2019-[facility]) 2. CAD status post CABG x2 (LIMA to LAD, SVG to D1)-11/20, [facility] 3. Intraprocedural cardiac arrest secondary to possible air embolus 4. Prolonged ventilation necessitating trach and PEG[facility] 5. Longstanding persistent atrial fibrillation postoperative with  moderate to severe biatrial enlargement normal LV function by echo in January 2021, mild to moderate RV hypokinesis, CHADSVASC Score of 5 -not anticoagulated due to concern for intracranial bleeding risk. 6. Dyslipidemia 7. BPH 8. ED 9. LVEF 60 to 65%, mild to moderate MR, severe TR (12/2019)  PLAN: 1.   Mr. Atchley does not report feeling any different being on 40  mg of Lasix however did have elevated filling pressures on echo with severe TR and a right atrial pressure of 15 mmHg.  BNP was mildly elevated and renal function was normal.  We will continue with this dose.  He is now interested in discussing the possibility of watchman left atrial appendage occluder device since he is not interested in anticoagulation due to concerns about intracerebral hemorrhage due to possible history of unruptured cerebral aneurysm.  He had 2 prior strokes and his CHA2DS2-VASc score is at least 5.  I will refer him to our structural heart clinic for further evaluation of watchman.  Plan follow-up with me in 6 months or sooner as necessary.  Pixie Casino, MD, [facility], [place] Director of the Advanced Lipid Disorders &  Cardiovascular Risk Reduction Clinic Diplomate of the American Board of Clinical Lipidology Attending Cardiologist  Direct Dial: [phone]  Fax: [phone]  Website:  [email or web address] Shirley Bolle 03/10/2020, 11:17 AM

## 2020-03-10 NOTE — Patient Instructions (Signed)
Medication Instructions:  Your physician recommends that you continue on your current medications as directed. Please refer to the Current Medication list given to you today.  *If you need a refill on your cardiac medications before your next appointment, please call your pharmacy*   Testing/Procedures: Home Sleep Test - you will be contacted to schedule once approved with insurance   Follow-Up: At [facility], you and your health needs are our priority.  As part of our continuing mission to provide you with exceptional heart care, we have created designated Provider Care Teams.  These Care Teams include your primary Cardiologist (physician) and Advanced Practice Providers (APPs -  Physician Assistants and Nurse Practitioners) who all work together to provide you with the care you need, when you need it.  We recommend signing up for the patient portal called "MyChart".  Sign up information is provided on this After Visit Summary.  MyChart is used to connect with patients for Virtual Visits (Telemedicine).  Patients are able to view lab/test results, encounter notes, upcoming appointments, etc.  Non-urgent messages can be sent to your provider as well.   To learn more about what you can do with MyChart, go to [email or web address].    Your next appointment:   6 month(s)  The format for your next appointment:   In Person  Provider:   You may see Pixie Casino, MD or one of the following Advanced Practice Providers on your designated Care Team:    Almyra Deforest, PA-C  Fabian Sharp, PA-C or   Roby Lofts, [place]    Other Instructions You have been referred to Structural Heart Clinic - discuss Watchman procedure

## 2020-03-11 ENCOUNTER — Telehealth: Payer: Self-pay

## 2020-03-11 NOTE — Telephone Encounter (Signed)
Per Dr. Debara Pickett, scheduled patient for [facility] consult with Dr. Burt Knack 12/6. The patient was grateful for call and agrees with plan.

## 2020-03-12 ENCOUNTER — Telehealth: Payer: Self-pay | Admitting: Internal Medicine

## 2020-03-12 NOTE — Telephone Encounter (Signed)
Called patient and told him he is scheduled for Wednesday, Dec. 29th at 11:30 am for the HST, to expect info packet and gave him the sleep lab number and address.

## 2020-03-12 NOTE — Telephone Encounter (Signed)
No PA required - will call sleep lab to schedule

## 2020-03-16 NOTE — Progress Notes (Addendum)
Watchman Consult Note   Date:  03/19/2020   ID:  Travis Brewer, DOB Aug 05, 1934, MRN [id number]  PCP:  Willeen Niece, PA  Cardiologist:  Dr Debara Pickett Primary Electrophysiologist: none Referring Physician: Dr Debara Pickett   CC: to discuss Watchman implant    History of Present Illness: Travis Brewer is a 85 y.o. male referred by Dr Debara Pickett for evaluation of atrial fibrillation and stroke prevention. He has persistent atrial fibrillation as well as history of bicuspid aortic valve with ascending aortic aneurysm status post surgical repair in 2020.  The patient underwent ascending aortic aneurysm repair with a 32 mm Hemashield graft, resuspension of the aortic valve, and two-vessel CABG with a LIMA to LAD and saphenous vein graft to diagonal.  Surgery was complicated by a postoperative stroke with blurred vision of the left eye and paresthesias in the right index finger.  He was noted to have an air embolism during surgery.  He developed postoperative atrial fibrillation and ultimately required a rehab stay after trach and PEG were required for prolonged respiratory failure.  He was also noted to have paralysis of the right hemidiaphragm.  The patient has been evaluated by their referring physician and is felt to be a poor candidate for long term [place] due to cerebral amyloid angiopathy.  He therefore presents today for Watchman evaluation.   The patient is here with his wife today.  He has had a remarkable recovery from a prolonged postoperative recovery requiring several months of inpatient rehab care with a tracheostomy and mechanical ventilation.  He ultimately was [facility] he was weaned from the ventilator over a period of several weeks.  He is back to full functional independence and participation in outpatient physical therapy.  He drives a car again and has progressed very well over the last 3 to 4 months.  He denies chest pain, shortness of  breath, orthopnea, PND, or heart palpitations.  He remains on oral furosemide and has minimal trouble with leg swelling.  He has had no trouble tolerating low-dose aspirin.  I have reviewed his CTA and MRI studies from [facility] when he suffered intraoperative and postoperative strokes.  He did not have any evidence of intracranial hemorrhage on the studies.  However, he is felt to have cerebral amyloid angiopathy and is felt to be a poor candidate for long-term anticoagulation based on review of outpatient neurology notes from [facility].   Past Medical History:  Diagnosis Date  . Acute on chronic respiratory failure with hypoxia ([place])   . BPH (benign prostatic hyperplasia)   . CAD (coronary artery disease)   . Cancer ([place])    Melonoma on left shoulder blade removed  . Cerebral amyloid angiopathy ([place])   . Chronic atrial fibrillation ([place])   . Chronic congestive heart failure with left ventricular diastolic dysfunction ([place])   . Diaphragmatic paralysis   . Dyspnea   . Dysrhythmia   . ED (erectile dysfunction)   . Hepatitis    Hep A  . History of DVT of lower extremity    BLE DVT ~ 04/2019  . Pneumonia   . 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 ASCENDING AORTIC ANEURYSM REPAIR  02/22/2019   32 mm Hemashild graft, STJ to ascending interposition graft, resuspension of AV [facility])  . BACK SURGERY    . CARDIAC CATHETERIZATION  02/15/2019   [place] BYPASS GRAFT  02/22/2019   LIMA to LAD, SVG to D1 [facility])  . EYE SURGERY Bilateral    Cataract surgery  . INGUINAL HERNIA REPAIR Right 11/14/2019   Procedure: RIGHT INGUINAL HERNIA REPAIR WITH MESH;  Surgeon: Donnie Mesa, MD;  Location: [facility];  Service: General;  Laterality: Right;  LMA AND TAP BLOCK  . JOINT REPLACEMENT Bilateral    knee replacement  . L3-L4 Back surgery    . PEG TUBE PLACEMENT    .  sciatic nerve cyst removal    . TONSILLECTOMY    . TRACHEOSTOMY       Current Outpatient Medications  Medication Sig Dispense Refill  . acetaminophen (TYLENOL) 500 MG tablet Take 500 mg by mouth daily.    . Ascorbic Acid (VITAMIN C) 500 MG CAPS Take 500 mg by mouth daily.     Marland Kitchen aspirin 81 MG chewable tablet Chew 81 mg by mouth daily.    Marland Kitchen atorvastatin (LIPITOR) 80 MG tablet Take 1 tablet (80 mg total) by mouth daily. 90 tablet 3  . furosemide (LASIX) 40 MG tablet Take 1 tablet (40 mg total) by mouth daily. 90 tablet 3  . metoprolol tartrate (LOPRESSOR) 25 MG tablet Take 25 mg by mouth 2 (two) times daily.     . Multiple Vitamins-Minerals (MULTIVITAMIN ADULT EXTRA C PO) Take 1 tablet by mouth daily.    . Multiple Vitamins-Minerals (PRESERVISION AREDS 2 PO) Take 1 tablet by mouth daily.    Marland Kitchen omeprazole (PRILOSEC) 40 MG capsule Take 40 mg by mouth daily.    . sildenafil (VIAGRA) 25 MG tablet Take 1 to 2 tablets by mouth as needed before sexual intercourse.    . tamsulosin (FLOMAX) 0.4 MG CAPS capsule Take 0.4 mg by mouth daily.      No current facility-administered medications for this visit.    Allergies:   Quetiapine and Clonazepam   Social History:  The patient  reports that he has never smoked. He has never used smokeless tobacco. He reports that he does not use drugs.   Family History:  The patient's  family history includes Diabetes in his mother; Stroke in his father.    ROS:  Please see the history of present illness.   All other systems are reviewed and negative.    PHYSICAL EXAM: VS:  BP 130/60   Pulse 65   Ht 5\' 7"  (1.702 m)   Wt 163 lb 6.4 oz (74.1 kg)   SpO2 98%   BMI 25.59 kg/m  , BMI Body mass index is 25.59 kg/m. GEN: Well nourished, well developed, in no acute distress  HEENT: normal  Neck: no JVD, carotid bruits, or masses Cardiac: Irregularly irregular; 2/6 systolic murmur at the left lower sternal border Respiratory:  clear to auscultation bilaterally,  normal work of breathing GI: soft, nontender, nondistended, + BS MS: no deformity or atrophy  Skin: warm and dry  Neuro:  Strength and sensation are intact Psych: euthymic mood, full affect  EKG:  EKG is not ordered today. Atrial fibrillation 62 bpm, incomplete right bundle branch block, otherwise normal   Recent Labs: 06/05/2019: TSH 2.456 06/25/2019: Magnesium 1.9 11/08/2019: ALT 23; Hemoglobin 11.2; Platelets 161 01/29/2020: BNP 219.3; BUN 20; Creatinine, Ser 1.07; Potassium 4.3; Sodium 139    Lipid Panel  No results found for: CHOL, TRIG, HDL, CHOLHDL, VLDL, LDLCALC, LDLDIRECT   Wt Readings from Last 3 Encounters:  03/17/20 163 lb 6.4 oz (74.1 kg)  03/10/20 160 lb (72.6 kg)  12/25/19 154 lb (69.9 kg)      Other studies Reviewed: Additional studies/ records that were reviewed today include:  Cardiac Catheterization 02/15/2019: Cardiac Catheterization Procedure  Narrative  1. The coronary circulation is right dominant. All coronary arteries are  of normal caliber for age.   2. The left main is short and free of focal disease.   3. The LAD is a relatively short vessel that does not wrap the apex. The  LAD supplies a very large proximal diagonal branch. The diagonal branch  has 95% stenosis at its origin and appears graftable. The proximal LAD has  irregular 40-50% stenosis. The mid-LAD beyond the LAD diagonal has a 3 cm  long region of diffuse 50-70% stenosis. The LAD thereafter appears  graftable.   4. The LCx supplies two large marginal branches. The LCx system is free of  focal disease.   5. The RCA supplies a large hyperdominant PDA and two large posterolateral  branches. The RCA system is free of focal disease.   6. Extreme brachial artery loop in the upper arm, traversed with a  prolapsed Magic wire.  Echo 01/24/2019: FINDINGS  1. Left Ventricle: Normal left ventricular cavity size. Proximal septal   hypertrophy - sigmoid shaped septum. EF estimated  at 60-65%.   Normal left ventricular ejection fraction. No apparent wall motion   abnormalities. Normal diastolic filling pattern for age.   2. Right Ventricle: Normal right ventricular size. Normal right   ventricular global systolic function.   3. Left Atrium: Normal left atrial size.   4. Right Atrium: Normal right atrial size.   5. Interatrial Septum: No patent foramen ovale present by color   Doppler.   6. Mitral Valve: Structurally normal mitral valve. No mitral stenosis.   Mild mitral regurgitation.   7. Aortic Valve: Possible bicuspid aortic valve with fusion of the   right and left cusps. No aortic stenosis. Mild-moderate aortic   regurgitation.   8. Tricuspid Valve: Normally structured tricuspid valve. No   tricuspid stenosis. Trace tricuspid regurgitation noted. RVSP (PA   systolic pressure from tricuspid regurgitation jet) is estimated at 24   mmHg. Right atrial pressure estimate of 3 mmHg. Normal   pulmonary artery systolic pressure. IVC diameter is normal with   normal respirophasic motion >50 percent compression with sniff   test.   9. Pulmonic Valve: Structurally normal pulmonic valve. No   pulmonic stenosis. Trace pulmonary regurgitation.   10. Masses and Vegetations: No masses or vegetations noted.   11. Aorta: Dilated aortic root, 4.4 cm at sinuses of Valsalva.   Severely dilated ascending aorta, 5.6 cm.   12. Pericardium: No pericardial effusion.    MRI Brain 03/19/2019: IMPRESSION:   1. Evidence of mild diffusion restriction in the left posterior frontal white matter involving the centrum semiovale and subcortical white matter of the precentral gyrus, with associated FLAIR signal change. Findings compatible with an evolving acute or  late subacute infarct.   2. Moderately extensive white matter signal changes are present elsewhere in the supratentorial brain and minimally in the brainstem compatible with small  vessel ischemic disease and chronic ischemic changes. Multiple chronic infarcts are present within  the right cerebellar hemisphere.   3. ASL perfusion shows no significant asymmetry of cerebral blood flow.   4. MR angiography shows patent major intracranial vessels with no evidence of hemodynamically significant focal stenosis.   CTA Brain 03/22/2019: IMPRESSION:   No evidence of vascular stenoses or  thromboses. No intracranial vascular abnormality.   Small vessel white matter changes in the periventricular and subcortical regions, the left side of the brainstem, and the right cerebellum.   Right-sided pleural effusion with atelectasis.   ASSESSMENT AND PLAN:  1.  Persistent atrial fibrillation (present since cardiac surgery 2020). I have seen Travis Brewer is a 85 y.o. male in the office today who has been referred by Dr Debara Pickett for a Watchman left atrial appendage closure device.  He has a history of persistent atrial fibrillation.  This patients CHA2DS2-VASc Score and unadjusted Ischemic Stroke Rate (% per year) is equal to 11.2 % stroke rate/year from a score of 7 which necessitates long term oral anticoagulation to prevent stroke. HasBled score is 4.  Unfortunately, He is not felt to be a long term anticoagulation candidate secondary to cerebral amyloid angiopathy.  I will reach out to the patient's primary care physician and his neurologist to discuss whether he might be a candidate for short-term anticoagulation.  If he is felt to be a candidate, he would like to pursue watchman implantation.  He understands that he would need to undergo a gated CT scan of the heart with contrast time to evaluate the left atrial appendage anatomy.  Procedural risks for the Watchman implant have been reviewed with the patient including a 1% risk of stroke, 2% risk of perforation, 0.1% risk of device embolization.  Given the patient's poor candidacy for long-term oral anticoagulation, ability to tolerate  short term oral anticoagulation, I have recommended the watchman left atrial appendage closure system.    Once these are performed I will review to confirm the patient continues to be a suitable candidate for Watchman implant. If so, we will schedule for the implant procedure at the first available date.  Current medicines are reviewed at length with the patient today.   The patient does not have concerns regarding his medicines.  The following changes were made today:  none  Labs/ tests ordered today include:  No orders of the defined types were placed in this encounter.  SignedSherren Mocha, MD 03/19/2020  8:24 AM     [address] [place] [address] [phone] (office) [phone] (fax)   ADDENDUM 04/18/2019: I have now spoken to the patient's primary care provider Willeen Niece) and his neurologist (Dr Lynnette Caffey).  We have discussed the pros and cons of watchman left atrial appendage occlusion in the context of the patient's radiographic findings of cerebral amyloid angiopathy and his risk of cerebral hemorrhage.  We agree that consideration of watchman left atrial appendage is both reasonable and appropriate in order to reduce this patient's long-term stroke risk in the context of persistent atrial fibrillation.  I would modify his post-watchman anticoagulation regimen so that he takes apixaban 5 mg twice daily x6 weeks without antiplatelet therapy, then he would take antiplatelet monotherapy for a total of 6 months rather than DAPT.  This should further reduce his bleeding risk in the short period of anticoagulation following the procedure.  I have spoken with the patient and his wife over the telephone and they are in favor of proceeding.  As a next step, the patient should undergo a gated CT angiogram of the heart for left atrial appendage evaluation.  As long as his cardiac anatomy is conducive, he will be contacted for further evaluation  after his CTA is completed.  Sherren Mocha 04/17/2020 5:17 PM

## 2020-03-17 ENCOUNTER — Other Ambulatory Visit: Payer: Self-pay

## 2020-03-17 ENCOUNTER — Encounter: Payer: Self-pay | Admitting: Cardiovascular Disease

## 2020-03-17 ENCOUNTER — Ambulatory Visit (INDEPENDENT_AMBULATORY_CARE_PROVIDER_SITE_OTHER): Payer: Medicare Other | Admitting: Cardiovascular Disease

## 2020-03-17 VITALS — BP 130/60 | HR 65 | Ht 67.0 in | Wt 163.4 lb

## 2020-03-17 DIAGNOSIS — I2510 Atherosclerotic heart disease of native coronary artery without angina pectoris: Secondary | ICD-10-CM

## 2020-03-17 DIAGNOSIS — I4811 Longstanding persistent atrial fibrillation: Secondary | ICD-10-CM | POA: Diagnosis not present

## 2020-03-17 NOTE — Patient Instructions (Signed)
We will call you to discuss next steps after Dr. Burt Knack discusses your case with your primary care physician and your neurologist.

## 2020-03-18 ENCOUNTER — Telehealth: Payer: Self-pay | Admitting: Cardiovascular Disease

## 2020-03-18 DIAGNOSIS — I4811 Longstanding persistent atrial fibrillation: Secondary | ICD-10-CM

## 2020-03-18 NOTE — Telephone Encounter (Signed)
Travis Brewer states she is returning a call from Dr. Burt Knack that she received yesterday. Please advise.

## 2020-03-19 ENCOUNTER — Encounter: Payer: Self-pay | Admitting: Cardiovascular Disease

## 2020-03-27 ENCOUNTER — Encounter: Payer: Self-pay | Admitting: Cardiovascular Disease

## 2020-03-27 NOTE — Telephone Encounter (Signed)
Travis patient called with an update for Dr. Burt Brewer. His wife had an appointment with Travis Brewer and she asked her about Watchman.  She stated that Travis Brewer said if Drs. Travis Brewer, Travis Brewer and Travis Brewer all agree Travis Brewer is best, she will agree as well. Travis patient also states if Dr. [name] does not agree, he would like a second opinion and a referral to a Travis Brewer neurologist.   He understands Dr. Burt Brewer and Travis Brewer have been playing phone tag but will continue to try to connect. He requests a call with an update next week.   [facility] Phone [phone]  Dr. [name]  Phone [phone]

## 2020-03-27 NOTE — Telephone Encounter (Signed)
Called and left message with Desiree. thx

## 2020-03-27 NOTE — Telephone Encounter (Signed)
Patient calling to discuss his watchman with Valetta Fuller.

## 2020-03-27 NOTE — Telephone Encounter (Signed)
This encounter was created in error - please disregard.

## 2020-03-28 NOTE — Telephone Encounter (Signed)
Spoke with Desiree last night.  We both agree that it is best for me to have a discussion with the neurologist, Dr. Lynnette Caffey.  She is supportive of what ever we decide upon regarding left atrial appendage occlusion.  I called Dr. Lynnette Caffey again today but he is out of the office.  I left a message for him with my cell phone contact and he will call back next week.

## 2020-04-09 ENCOUNTER — Ambulatory Visit (HOSPITAL_BASED_OUTPATIENT_CLINIC_OR_DEPARTMENT_OTHER): Payer: Medicare Other | Attending: Internal Medicine | Admitting: Cardiovascular Disease

## 2020-04-09 ENCOUNTER — Other Ambulatory Visit: Payer: Self-pay

## 2020-04-09 DIAGNOSIS — Z79899 Other long term (current) drug therapy: Secondary | ICD-10-CM | POA: Insufficient documentation

## 2020-04-09 DIAGNOSIS — Z7982 Long term (current) use of aspirin: Secondary | ICD-10-CM | POA: Diagnosis not present

## 2020-04-09 DIAGNOSIS — G4733 Obstructive sleep apnea (adult) (pediatric): Secondary | ICD-10-CM | POA: Diagnosis not present

## 2020-04-09 DIAGNOSIS — I482 Chronic atrial fibrillation, unspecified: Secondary | ICD-10-CM | POA: Diagnosis not present

## 2020-04-09 DIAGNOSIS — Z8673 Personal history of transient ischemic attack (TIA), and cerebral infarction without residual deficits: Secondary | ICD-10-CM | POA: Insufficient documentation

## 2020-04-09 DIAGNOSIS — G4736 Sleep related hypoventilation in conditions classified elsewhere: Secondary | ICD-10-CM | POA: Diagnosis not present

## 2020-04-09 DIAGNOSIS — I4819 Other persistent atrial fibrillation: Secondary | ICD-10-CM | POA: Insufficient documentation

## 2020-04-09 DIAGNOSIS — R0681 Apnea, not elsewhere classified: Secondary | ICD-10-CM

## 2020-04-17 NOTE — Telephone Encounter (Signed)
Called Dr Langston Masker' office again [facility] neurology). Spoke with Malachi Bonds and was put on hold for over 10 min. She thought Dr Langston Masker was probably gone for the day. [place] let's try again tomorrow at lunch. thx

## 2020-04-18 NOTE — Telephone Encounter (Signed)
ADDENDUM 04/18/2019: I have now spoken to the patient's primary care provider Willeen Niece) and his neurologist (Dr Lynnette Caffey).  We have discussed the pros and cons of watchman left atrial appendage occlusion in the context of the patient's radiographic findings of cerebral amyloid angiopathy and his risk of cerebral hemorrhage.  We agree that consideration of watchman left atrial appendage is both reasonable and appropriate in order to reduce this patient's long-term stroke risk in the context of persistent atrial fibrillation.  I would modify his post-watchman anticoagulation regimen so that he takes apixaban 5 mg twice daily x6 weeks without antiplatelet therapy, then he would take antiplatelet monotherapy for a total of 6 months rather than DAPT.  This should further reduce his bleeding risk in the short period of anticoagulation following the procedure.  I have spoken with the patient and his wife over the telephone and they are in favor of proceeding.  As a next step, the patient should undergo a gated CT angiogram of the heart for left atrial appendage evaluation.  As long as his cardiac anatomy is conducive, he will be contacted for further evaluation after his CTA is completed.  Sherren Mocha 04/17/2020 5:17 PM

## 2020-04-21 ENCOUNTER — Telehealth: Payer: Self-pay | Admitting: Internal Medicine

## 2020-04-21 NOTE — Telephone Encounter (Signed)
Confirmed with the patient Watchman CT is scheduled 05/01/20. He understands to arrive to Radiology at 1100. Instructions reviewed. He will sign up for MyChart and pick up instruction letter for CT when he comes to update blood work 04/29/20. He understands all procedures requiring a 1 night stay are on hold right now but he will be notified when able to proceed. He was grateful for call and agrees with plan.

## 2020-04-21 NOTE — Telephone Encounter (Signed)
[place] is calling requesting his sleep study results. Please advise.

## 2020-04-21 NOTE — Telephone Encounter (Signed)
Watchman CT orders placed. Will call and review instructions once scheduled.

## 2020-04-21 NOTE — Telephone Encounter (Signed)
Spoke with pt, aware do not see the results of his sleep study in his chart but will send a message to the sleep folks to find out if they have seen it.

## 2020-04-24 NOTE — Telephone Encounter (Signed)
Reached out to the patient and let him know his study has not yet been read and Mariann Laster will call him when dr Claiborne Billings reads it. Pt is agreeable to treatment.

## 2020-04-28 ENCOUNTER — Encounter (HOSPITAL_BASED_OUTPATIENT_CLINIC_OR_DEPARTMENT_OTHER): Payer: Self-pay | Admitting: Cardiovascular Disease

## 2020-04-28 NOTE — Procedures (Signed)
    Patient Name: Travis Brewer, Travis Brewer Date: 04/10/2020 Gender: Male D.O.B: 12/08/1934 Age (years): 14 Referring Provider: Nadean Corwin Hilty Height (inches): 67 Interpreting Physician: Shelva Majestic MD, ABSM Weight (lbs): 165 RPSGT: Jorge Ny BMI: 26 MRN: [id number] Neck Size: 17.00  CLINICAL INFORMATION Sleep Study Type: HST  Indication for sleep study: witnessed apnea, persistent AF, h/o CVA  Epworth Sleepiness Score: 0  SLEEP STUDY TECHNIQUE A multi-channel overnight portable sleep study was performed. The channels recorded were: nasal airflow, thoracic respiratory movement, and oxygen saturation with a pulse oximetry. Snoring was also monitored.  MEDICATIONS acetaminophen (TYLENOL) 500 MG tablet Ascorbic Acid (VITAMIN C) 500 MG CAPS aspirin 81 MG chewable tablet atorvastatin (LIPITOR) 80 MG tablet furosemide (LASIX) 40 MG tablet metoprolol tartrate (LOPRESSOR) 25 MG tablet Multiple Vitamins-Minerals (MULTIVITAMIN ADULT EXTRA C PO) Multiple Vitamins-Minerals (PRESERVISION AREDS 2 PO) omeprazole (PRILOSEC) 40 MG capsule sildenafil (VIAGRA) 25 MG tablet tamsulosin (FLOMAX) 0.4 MG CAPS capsule Patient self administered medications include: N/A.  SLEEP ARCHITECTURE Patient was studied for 413.7 minutes. The sleep efficiency was 100.0 % and the patient was supine for 75.1%. The arousal index was 0.0 per hour.  RESPIRATORY PARAMETERS The overall AHI was 45.3 per hour, with a central apnea index of 0.1 per hour.  The oxygen nadir was 82% during sleep.  CARDIAC DATA Mean heart rate during sleep was 64.6 bpm.  IMPRESSIONS - Severe obstructive sleep apnea occurred during this study (AHI 45.3/h). Events were worse with supine sleep (AHI 48.3/h) versus non-supine sleep (AHI 36.1/h) - No significant central sleep apnea occurred during this study (CAI = 0.1/h). - Moderate oxygen desaturation to a nadir of 82%. Time spent < 89% was 29.7 minutes. - Patient snored 0.8%  during the sleep.  DIAGNOSIS - Obstructive Sleep Apnea (G47.33) - Nocturnal Hypoxemia (G47.36)  RECOMMENDATIONS - In this patient with severe sleep apnea and significant cardiovascular co-morbidities recommend an in-lab CPAP titration study. - Effort should be made to optimize nasal and oropharyngeal patency. - Positional therapy avoiding supine position during sleep. - Avoid alcohol, sedatives and other CNS depressants that may worsen sleep apnea and disrupt normal sleep architecture. - Sleep hygiene should be reviewed to assess factors that may improve sleep quality. - Weight management and regular exercise should be initiated or continued. - Recommend a download and sleep clinic evaluation after initiation of therapy.   [Electronically signed] 04/28/2020 02:24 PM  Shelva Majestic MD, [facility] [place], American Board of Sleep Medicine   NPI: [id number] [place] PH: [phone]   FX: [phone] [place]

## 2020-04-29 ENCOUNTER — Telehealth (HOSPITAL_COMMUNITY): Payer: Self-pay | Admitting: *Deleted

## 2020-04-29 ENCOUNTER — Other Ambulatory Visit: Payer: Medicare Other

## 2020-04-29 NOTE — Telephone Encounter (Signed)
Reaching out to patient to offer assistance regarding upcoming cardiac imaging study; pt verbalizes understanding of appt date/time, parking situation and where to check in, pre-test NPO status and verified current allergies; name and call back number provided for further questions should they arise  Gordy Clement RN Navigator Cardiac [facility] [facility] [phone] office [phone] cell

## 2020-04-30 ENCOUNTER — Other Ambulatory Visit: Payer: Self-pay

## 2020-04-30 ENCOUNTER — Other Ambulatory Visit: Payer: Medicare Other | Admitting: *Deleted

## 2020-04-30 DIAGNOSIS — I4811 Longstanding persistent atrial fibrillation: Secondary | ICD-10-CM

## 2020-05-01 ENCOUNTER — Ambulatory Visit (HOSPITAL_COMMUNITY)
Admission: RE | Admit: 2020-05-01 | Discharge: 2020-05-01 | Disposition: A | Discharge status: Home / Self Care | Payer: Medicare Other | Source: Ambulatory Visit | Attending: Cardiovascular Disease | Admitting: Cardiovascular Disease

## 2020-05-01 ENCOUNTER — Encounter (HOSPITAL_COMMUNITY): Payer: Self-pay

## 2020-05-01 DIAGNOSIS — I4811 Longstanding persistent atrial fibrillation: Secondary | ICD-10-CM | POA: Diagnosis present

## 2020-05-01 LAB — BASIC METABOLIC PANEL
BUN/Creatinine Ratio: 16 (ref 10–24)
BUN: 19 mg/dL (ref 8–27)
CO2: 27 mmol/L (ref 20–29)
Calcium: 9.9 mg/dL (ref 8.6–10.2)
Chloride: 101 mmol/L (ref 96–106)
Creatinine, Ser: 1.17 mg/dL (ref 0.76–1.27)
GFR calc Af Amer: 66 mL/min/{1.73_m2} (ref >59)
GFR calc non Af Amer: 57 mL/min/{1.73_m2} — ABNORMAL LOW (ref >59)
Glucose: 82 mg/dL (ref 65–99)
Potassium: 4.9 mmol/L (ref 3.5–5.2)
Sodium: 140 mmol/L (ref 134–144)

## 2020-05-01 MED ORDER — IOHEXOL 350 MG/ML SOLN
80.0000 mL | Freq: Once | INTRAVENOUS | Status: AC | PRN
  Administered 2020-05-01: 80 mL via INTRAVENOUS

## 2020-05-05 ENCOUNTER — Telehealth: Payer: Self-pay | Admitting: Nurse Practitioner

## 2020-05-05 NOTE — Telephone Encounter (Signed)
Patient contacted this morning to discuss Watchman implant date of 2/17. Patient is in agreement with this date and wishes to proceed with implant. We also discussed the next steps in the work up process and post implant expectations. All questions answered and patient encouraged to call me with any new questions.  Ambrose Pancoast RN Watchman Coordinator

## 2020-05-07 ENCOUNTER — Encounter: Payer: Self-pay | Admitting: Cardiovascular Disease

## 2020-05-07 NOTE — Telephone Encounter (Signed)
Patient states he is returning a call from [place]

## 2020-05-07 NOTE — Telephone Encounter (Signed)
Called to schedule patient for 30 day H&P visit with Adline Peals, PA.  Will arrange early week prior to 2/17. The patient will need EKG, CXR, updated BMET and CBC, and procedure instructions at visit.   Left message to call back.

## 2020-05-07 NOTE — Telephone Encounter (Signed)
This encounter was created in error - please disregard.

## 2020-05-07 NOTE — Telephone Encounter (Signed)
Scheduled the patient for pre-Watchman visit with Adline Peals, PA 05/21/2020. MyChart message sent to patient with appointment information. He was grateful for call and agrees with plan.

## 2020-05-13 ENCOUNTER — Telehealth: Payer: Self-pay | Admitting: *Deleted

## 2020-05-13 ENCOUNTER — Other Ambulatory Visit: Payer: Self-pay | Admitting: Cardiovascular Disease

## 2020-05-13 DIAGNOSIS — G4733 Obstructive sleep apnea (adult) (pediatric): Secondary | ICD-10-CM

## 2020-05-13 DIAGNOSIS — G4736 Sleep related hypoventilation in conditions classified elsewhere: Secondary | ICD-10-CM

## 2020-05-13 NOTE — Telephone Encounter (Signed)
Patient notified of HST results and recommendations. He does not want to proceed any further right now due to getting ready to get watchman. States that he has "too many things" on his plate right now. He will call back when he is ready to proceed.

## 2020-05-21 ENCOUNTER — Ambulatory Visit (HOSPITAL_COMMUNITY)
Admission: RE | Admit: 2020-05-21 | Discharge: 2020-05-21 | Disposition: A | Discharge status: Home / Self Care | Payer: Medicare Other | Source: Ambulatory Visit | Attending: Physician Assistant | Admitting: Physician Assistant

## 2020-05-21 ENCOUNTER — Other Ambulatory Visit: Payer: Self-pay

## 2020-05-21 ENCOUNTER — Encounter (HOSPITAL_COMMUNITY): Payer: Self-pay | Admitting: Physician Assistant

## 2020-05-21 VITALS — BP 102/60 | HR 68 | Ht 67.0 in | Wt 164.4 lb

## 2020-05-21 DIAGNOSIS — G4733 Obstructive sleep apnea (adult) (pediatric): Secondary | ICD-10-CM | POA: Diagnosis not present

## 2020-05-21 DIAGNOSIS — Z951 Presence of aortocoronary bypass graft: Secondary | ICD-10-CM | POA: Insufficient documentation

## 2020-05-21 DIAGNOSIS — D6869 Other thrombophilia: Secondary | ICD-10-CM | POA: Insufficient documentation

## 2020-05-21 DIAGNOSIS — I5032 Chronic diastolic (congestive) heart failure: Secondary | ICD-10-CM | POA: Insufficient documentation

## 2020-05-21 DIAGNOSIS — I2510 Atherosclerotic heart disease of native coronary artery without angina pectoris: Secondary | ICD-10-CM | POA: Insufficient documentation

## 2020-05-21 DIAGNOSIS — I083 Combined rheumatic disorders of mitral, aortic and tricuspid valves: Secondary | ICD-10-CM | POA: Diagnosis not present

## 2020-05-21 DIAGNOSIS — I4819 Other persistent atrial fibrillation: Secondary | ICD-10-CM | POA: Insufficient documentation

## 2020-05-21 LAB — BASIC METABOLIC PANEL
Anion gap: 11 (ref 5–15)
BUN: 18 mg/dL (ref 8–23)
CO2: 24 mmol/L (ref 22–32)
Calcium: 9.1 mg/dL (ref 8.9–10.3)
Chloride: 101 mmol/L (ref 98–111)
Creatinine, Ser: 1.01 mg/dL (ref 0.61–1.24)
GFR, Estimated: >60 mL/min (ref >60)
Glucose, Bld: 111 mg/dL — ABNORMAL HIGH (ref 70–99)
Potassium: 4 mmol/L (ref 3.5–5.1)
Sodium: 136 mmol/L (ref 135–145)

## 2020-05-21 LAB — CBC
HCT: 40 % (ref 39.0–52.0)
Hemoglobin: 13.6 g/dL (ref 13.0–17.0)
MCH: 34.3 pg — ABNORMAL HIGH (ref 26.0–34.0)
MCHC: 34 g/dL (ref 30.0–36.0)
MCV: 101 fL — ABNORMAL HIGH (ref 80.0–100.0)
Platelets: 168 10*3/uL (ref 150–400)
RBC: 3.96 MIL/uL — ABNORMAL LOW (ref 4.22–5.81)
RDW: 13.7 % (ref 11.5–15.5)
WBC: 7 10*3/uL (ref 4.0–10.5)
nRBC: 0 % (ref 0.0–0.2)

## 2020-05-21 MED ORDER — APIXABAN 5 MG PO TABS
5.0000 mg | ORAL_TABLET | Freq: Two times a day (BID) | ORAL | 1 refills | Status: DC
Start: 2020-05-21 — End: 2020-05-29

## 2020-05-21 NOTE — Addendum Note (Signed)
Encounter addended by: Juluis Mire, RN on: 05/21/2020 4:36 PM  Actions taken: Pharmacy for encounter modified, Order list changed

## 2020-05-21 NOTE — Progress Notes (Signed)
Primary Care Physician: Willeen Niece, [place] Primary Cardiologist: Dr Debara Pickett Primary Electrophysiologist: none Referring Physician: Dr Clarisse Gouge is a 86 y.o. male with a history of persistent atrial fibrillation as well as history of bicuspid aortic valve with ascending aortic aneurysm status post surgical repair in 2020 and two-vessel CABG who presents for consultation in the [facility]. His surgery in 0109 was complicated by a postoperative stroke with blurred vision of the left eye and paresthesias in the right index finger. He developed postoperative atrial fibrillation and ultimately required a rehab stay. The patient was referred to Dr Burt Knack 03/17/20 for Watchman as he is considered to be a poor candidate for long term [facility] due to cerebral amyloid angiopathy. Patient has a CHADS2VASC score of 6. He is scheduled for Watchman implant 05/29/20.   Today, he denies symptoms of palpitations, chest pain, shortness of breath, orthopnea, PND, lower extremity edema, dizziness, presyncope, syncope, snoring, daytime somnolence, bleeding, or neurologic sequela. The patient is tolerating medications without difficulties and is otherwise without complaint today.    Atrial Fibrillation Risk Factors:  he does not have symptoms or diagnosis of sleep apnea. he does not have a history of rheumatic fever.   he has a BMI of Body mass index is 25.75 kg/m.Marland Kitchen Filed Weights   05/21/20 1333  Weight: 74.6 kg    Family History  Problem Relation Age of Onset  . Diabetes Mother   . Stroke Father      Atrial Fibrillation Management history:  Previous antiarrhythmic drugs: none Previous cardioversions: none Previous ablations: none CHADS2VASC score: 6 Anticoagulation history: none   Past Medical History:  Diagnosis Date  . Acute on chronic respiratory failure with hypoxia ([place])   . BPH (benign prostatic hyperplasia)   . CAD (coronary artery disease)   .  Cancer ([place])    Melonoma on left shoulder blade removed  . Cerebral amyloid angiopathy ([place])   . Chronic atrial fibrillation ([place])   . Chronic congestive heart failure with left ventricular diastolic dysfunction ([place])   . Diaphragmatic paralysis   . Dyspnea   . Dysrhythmia   . ED (erectile dysfunction)   . Hepatitis    Hep A  . History of DVT of lower extremity    BLE DVT ~ 04/2019  . Pneumonia   . 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 ASCENDING AORTIC ANEURYSM REPAIR  02/22/2019   32 mm Hemashild graft, STJ to ascending interposition graft, resuspension of AV [facility])  . BACK SURGERY    . CARDIAC CATHETERIZATION  02/15/2019   [facility]  . CORONARY ARTERY BYPASS GRAFT  02/22/2019   LIMA to LAD, SVG to D1 [facility])  . EYE SURGERY Bilateral    Cataract surgery  . INGUINAL HERNIA REPAIR Right 11/14/2019   Procedure: RIGHT INGUINAL HERNIA REPAIR WITH MESH;  Surgeon: Donnie Mesa, MD;  Location: [facility];  Service: General;  Laterality: Right;  LMA AND TAP BLOCK  . JOINT REPLACEMENT Bilateral    knee replacement  . L3-L4 Back surgery    . PEG TUBE PLACEMENT    . sciatic nerve cyst removal    . TONSILLECTOMY    . TRACHEOSTOMY      Current Outpatient Medications  Medication Sig Dispense Refill  . acetaminophen (TYLENOL) 500 MG tablet Take 500 mg by mouth daily.    . Ascorbic Acid (  VITAMIN C) 500 MG CAPS Take 500 mg by mouth daily.     Marland Kitchen aspirin 81 MG chewable tablet Chew 81 mg by mouth daily.    Marland Kitchen atorvastatin (LIPITOR) 80 MG tablet Take 1 tablet (80 mg total) by mouth daily. 90 tablet 3  . furosemide (LASIX) 40 MG tablet Take 1 tablet (40 mg total) by mouth daily. 90 tablet 3  . metoprolol tartrate (LOPRESSOR) 25 MG tablet Take 25 mg by mouth 2 (two) times daily.     . Multiple Vitamins-Minerals (MULTIVITAMIN ADULT EXTRA C PO) Take 1 tablet by mouth daily.    . Multiple  Vitamins-Minerals (PRESERVISION AREDS 2 PO) Take 1 tablet by mouth daily.    Marland Kitchen omeprazole (PRILOSEC) 20 MG capsule     . tamsulosin (FLOMAX) 0.4 MG CAPS capsule Take 0.4 mg by mouth daily.      No current facility-administered medications for this encounter.    Allergies  Allergen Reactions  . Quetiapine Nausea And Vomiting  . Clonazepam Nausea Only    Social History   Socioeconomic History  . Marital status: Married    Spouse name: Not on file  . Number of children: Not on file  . Years of education: Not on file  . Highest education level: Not on file  Occupational History  . Not on file  Tobacco Use  . Smoking status: Never Smoker  . Smokeless tobacco: Never Used  Vaping Use  . Vaping Use: Never used  Substance and Sexual Activity  . Alcohol use: Not on file    Comment: nightly beer/ wine (2-3 drinks)  . Drug use: Never  . Sexual activity: Not on file  Other Topics Concern  . Not on file  Social History Narrative  . Not on file   Social Determinants of Health   Financial Resource Strain: Not on file  Food Insecurity: Not on file  Transportation Needs: Not on file  Physical Activity: Not on file  Stress: Not on file  Social Connections: Not on file  Intimate Partner Violence: Not on file     ROS- All systems are reviewed and negative except as per the HPI above.  Physical Exam: Vitals:   05/21/20 1333  BP: 102/60  Pulse: 68  Weight: 74.6 kg  Height: 5\' 7"  (1.702 m)    GEN- The patient is well appearing elderly male, alert and oriented x 3 today.   Head- normocephalic, atraumatic Eyes-  Sclera clear, conjunctiva pink Ears- hearing intact Oropharynx- clear Neck- supple  Lungs- Clear to ausculation bilaterally, normal work of breathing Heart- irregular rate and rhythm, no murmurs, rubs or gallops  GI- soft, NT, ND, + BS Extremities- no clubbing, cyanosis, or edema MS- no significant deformity or atrophy Skin- no rash or lesion Psych- euthymic  mood, full affect Neuro- strength and sensation are intact  Wt Readings from Last 3 Encounters:  05/21/20 74.6 kg  04/09/20 74.8 kg  03/17/20 74.1 kg    EKG today demonstrates  Afib  Vent. rate 68 BPM QRS duration 92 ms QT/QTc 400/425 ms  Echo 12/21/19 demonstrated  1. Left ventricular ejection fraction, by estimation, is 60 to 65%. The  left ventricle has normal function. The left ventricle has no regional  wall motion abnormalities. Left ventricular diastolic function could not  be evaluated. There is the  interventricular septum is flattened in diastole ('D' shaped left  ventricle), consistent with right ventricular volume overload.  2. Right ventricular systolic function is mildly reduced. The right  ventricular size is severely enlarged. There is mildly elevated pulmonary  artery systolic pressure. The estimated right ventricular systolic  pressure is 11.5 mmHg.  3. Left atrial size was mild to moderately dilated.  4. Right atrial size was severely dilated.  5. The mitral valve is normal in structure. Mild to moderate mitral valve  regurgitation.  6. Tricuspid valve regurgitation is severe.  7. The aortic valve is tricuspid. Aortic valve regurgitation is mild.  Mild aortic valve sclerosis is present, with no evidence of aortic valve  stenosis.  8. The ascending aorta is not well visualized. There is mild dilatation  of the aortic root, measuring 42 mm.  9. The inferior vena cava is dilated in size with <50% respiratory  variability, suggesting right atrial pressure of 15 mmHg.   Epic records are reviewed at length today  CHA2DS2-VASc Score = 6  The patient's score is based upon: CHF History: Yes HTN History: No Diabetes History: No Stroke History: Yes Vascular Disease History: Yes Age Score: 2 Gender Score: 0      ASSESSMENT AND PLAN: 1. Persistent Atrial Fibrillation (ICD10:  I48.19) The patient's CHA2DS2-VASc score is 6, indicating a 9.7% annual  risk of stroke.   He has a history of persistent atrial fibrillation. This patient's CHA2DS2-VASc Score and unadjusted Ischemic Stroke Rate (% per year) is equal to 9.7 % stroke rate/year from a score of 6 which necessitates long term oral anticoagulation to prevent stroke. HasBled score is 4.  Unfortunately, He is not felt to be a long term anticoagulation candidate secondary to cerebral amyloid angiopathy. Procedural risks for the Watchman implant have again been reviewed with the patient including a 1% risk of stroke, 2% risk of perforation, 0.1% risk of device embolization. He is scheduled with Dr Burt Knack for [facility] implant on 05/29/20.  CT 05/01/20 showed left atrial appendage is chicken wing morphology with superior and medial rotation, without thrombus. A 27 mm watchman FLX device is recommended based on the abovelanding zone measurements (21.9 mm maximum diameter; 20% compression)  Check bmet/CBC Check CXR Start Eliquis 5 mg BID 5 days prior to implant, hold morning of procedure. Stop ASA when he starts Eliquis, will do Eliquis only for 45 days post implant, then Plavix monotherapy for 6 months.  2. Secondary Hypercoagulable State (ICD10:  D68.69) The patient is at significant risk for stroke/thromboembolism based upon his CHA2DS2-VASc Score of 6.  See plans above.  3. Severe Obstructive sleep apnea Patient going for in lab CPAP titration after Watchman implant.  4. CAD S/p CABG No anginal symptoms.   Follow up for Watchman implant on 05/29/20.   [facility] [address] [place], [place] [address] [phone] 05/21/2020 1:46 PM

## 2020-05-21 NOTE — Patient Instructions (Addendum)
Once we call you when you should stop aspirin and start Eliquis 5mg  twice a day   LEFT ATRIAL APPENDAGE CLOSURE INSTRUCTIONS:  You are scheduled for a Left Atrial Appendage Device Implantation on Thursday, February 17th  1. Please arrive at the [facility] (Main Entrance A) at [facility]: [address] [place], [place] [address] at 730AM   (This time is two hours before your procedure to ensure your preparation). Free valet parking service is available. You are allowed ONE visitor in the waiting room during your procedure. Both you and your guest must wear masks. Special note: Every effort is made to have your procedure done on time. Please understand that emergencies sometimes delay scheduled procedures.  2.   Do not eat or drink after midnight prior to your procedure.     3.   Medication Instructions: Take ONLY the listed medications morning of procedure with a sip of water: Metoprolol   4. Plan for one night stay--bring personal belongings. When you are discharged, you will need someone to drive you home and stay with you for 24 hours.  5. Bring a current list of your medications and current insurance cards.  6. Please wear clothes that are easy to get on and off and wear slip-on shoes.  7. Ambrose Pancoast, the [facility], will arrange follow-up for you during your hospital stay and you will be discharged with your appointment dates/times. His direct number is [phone] if you need assistance.  **If you have any questions, do not hesitate to call the office! You will also be contacted the week of your procedure to confirm instructions.**

## 2020-05-21 NOTE — H&P (View-Only) (Signed)
Primary Care Physician: Willeen Niece, [place] Primary Cardiologist: Dr Debara Pickett Primary Electrophysiologist: none Referring Physician: Dr Clarisse Gouge is a 86 y.o. male with a history of persistent atrial fibrillation as well as history of bicuspid aortic valve with ascending aortic aneurysm status post surgical repair in 2020 and two-vessel CABG who presents for consultation in the [facility]. His surgery in 0093 was complicated by a postoperative stroke with blurred vision of the left eye and paresthesias in the right index finger. He developed postoperative atrial fibrillation and ultimately required a rehab stay. The patient was referred to Dr Burt Knack 03/17/20 for Watchman as he is considered to be a poor candidate for long term [facility] due to cerebral amyloid angiopathy. Patient has a CHADS2VASC score of 6. He is scheduled for Watchman implant 05/29/20.   Today, he denies symptoms of palpitations, chest pain, shortness of breath, orthopnea, PND, lower extremity edema, dizziness, presyncope, syncope, snoring, daytime somnolence, bleeding, or neurologic sequela. The patient is tolerating medications without difficulties and is otherwise without complaint today.    Atrial Fibrillation Risk Factors:  he does not have symptoms or diagnosis of sleep apnea. he does not have a history of rheumatic fever.   he has a BMI of Body mass index is 25.75 kg/m.Marland Kitchen Filed Weights   05/21/20 1333  Weight: 74.6 kg    Family History  Problem Relation Age of Onset  . Diabetes Mother   . Stroke Father      Atrial Fibrillation Management history:  Previous antiarrhythmic drugs: none Previous cardioversions: none Previous ablations: none CHADS2VASC score: 6 Anticoagulation history: none   Past Medical History:  Diagnosis Date  . Acute on chronic respiratory failure with hypoxia ([place])   . BPH (benign prostatic hyperplasia)   . CAD (coronary artery disease)   .  Cancer ([place])    Melonoma on left shoulder blade removed  . Cerebral amyloid angiopathy ([place])   . Chronic atrial fibrillation ([place])   . Chronic congestive heart failure with left ventricular diastolic dysfunction ([place])   . Diaphragmatic paralysis   . Dyspnea   . Dysrhythmia   . ED (erectile dysfunction)   . Hepatitis    Hep A  . History of DVT of lower extremity    BLE DVT ~ 04/2019  . Pneumonia   . 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 ASCENDING AORTIC ANEURYSM REPAIR  02/22/2019   32 mm Hemashild graft, STJ to ascending interposition graft, resuspension of AV [facility])  . BACK SURGERY    . CARDIAC CATHETERIZATION  02/15/2019   [facility]  . CORONARY ARTERY BYPASS GRAFT  02/22/2019   LIMA to LAD, SVG to D1 [facility])  . EYE SURGERY Bilateral    Cataract surgery  . INGUINAL HERNIA REPAIR Right 11/14/2019   Procedure: RIGHT INGUINAL HERNIA REPAIR WITH MESH;  Surgeon: Donnie Mesa, MD;  Location: [facility];  Service: General;  Laterality: Right;  LMA AND TAP BLOCK  . JOINT REPLACEMENT Bilateral    knee replacement  . L3-L4 Back surgery    . PEG TUBE PLACEMENT    . sciatic nerve cyst removal    . TONSILLECTOMY    . TRACHEOSTOMY      Current Outpatient Medications  Medication Sig Dispense Refill  . acetaminophen (TYLENOL) 500 MG tablet Take 500 mg by mouth daily.    . Ascorbic Acid (  VITAMIN C) 500 MG CAPS Take 500 mg by mouth daily.     . aspirin 81 MG chewable tablet Chew 81 mg by mouth daily.    . atorvastatin (LIPITOR) 80 MG tablet Take 1 tablet (80 mg total) by mouth daily. 90 tablet 3  . furosemide (LASIX) 40 MG tablet Take 1 tablet (40 mg total) by mouth daily. 90 tablet 3  . metoprolol tartrate (LOPRESSOR) 25 MG tablet Take 25 mg by mouth 2 (two) times daily.     . Multiple Vitamins-Minerals (MULTIVITAMIN ADULT EXTRA C PO) Take 1 tablet by mouth daily.    . Multiple  Vitamins-Minerals (PRESERVISION AREDS 2 PO) Take 1 tablet by mouth daily.    . omeprazole (PRILOSEC) 20 MG capsule     . tamsulosin (FLOMAX) 0.4 MG CAPS capsule Take 0.4 mg by mouth daily.      No current facility-administered medications for this encounter.    Allergies  Allergen Reactions  . Quetiapine Nausea And Vomiting  . Clonazepam Nausea Only    Social History   Socioeconomic History  . Marital status: Married    Spouse name: Not on file  . Number of children: Not on file  . Years of education: Not on file  . Highest education level: Not on file  Occupational History  . Not on file  Tobacco Use  . Smoking status: Never Smoker  . Smokeless tobacco: Never Used  Vaping Use  . Vaping Use: Never used  Substance and Sexual Activity  . Alcohol use: Not on file    Comment: nightly beer/ wine (2-3 drinks)  . Drug use: Never  . Sexual activity: Not on file  Other Topics Concern  . Not on file  Social History Narrative  . Not on file   Social Determinants of Health   Financial Resource Strain: Not on file  Food Insecurity: Not on file  Transportation Needs: Not on file  Physical Activity: Not on file  Stress: Not on file  Social Connections: Not on file  Intimate Partner Violence: Not on file     ROS- All systems are reviewed and negative except as per the HPI above.  Physical Exam: Vitals:   05/21/20 1333  BP: 102/60  Pulse: 68  Weight: 74.6 kg  Height: 5' 7" (1.702 m)    GEN- The patient is well appearing elderly male, alert and oriented x 3 today.   Head- normocephalic, atraumatic Eyes-  Sclera clear, conjunctiva pink Ears- hearing intact Oropharynx- clear Neck- supple  Lungs- Clear to ausculation bilaterally, normal work of breathing Heart- irregular rate and rhythm, no murmurs, rubs or gallops  GI- soft, NT, ND, + BS Extremities- no clubbing, cyanosis, or edema MS- no significant deformity or atrophy Skin- no rash or lesion Psych- euthymic  mood, full affect Neuro- strength and sensation are intact  Wt Readings from Last 3 Encounters:  05/21/20 74.6 kg  04/09/20 74.8 kg  03/17/20 74.1 kg    EKG today demonstrates  Afib  Vent. rate 68 BPM QRS duration 92 ms QT/QTc 400/425 ms  Echo 12/21/19 demonstrated  1. Left ventricular ejection fraction, by estimation, is 60 to 65%. The  left ventricle has normal function. The left ventricle has no regional  wall motion abnormalities. Left ventricular diastolic function could not  be evaluated. There is the  interventricular septum is flattened in diastole ('D' shaped left  ventricle), consistent with right ventricular volume overload.  2. Right ventricular systolic function is mildly reduced. The right    ventricular size is severely enlarged. There is mildly elevated pulmonary  artery systolic pressure. The estimated right ventricular systolic  pressure is 19.5 mmHg.  3. Left atrial size was mild to moderately dilated.  4. Right atrial size was severely dilated.  5. The mitral valve is normal in structure. Mild to moderate mitral valve  regurgitation.  6. Tricuspid valve regurgitation is severe.  7. The aortic valve is tricuspid. Aortic valve regurgitation is mild.  Mild aortic valve sclerosis is present, with no evidence of aortic valve  stenosis.  8. The ascending aorta is not well visualized. There is mild dilatation  of the aortic root, measuring 42 mm.  9. The inferior vena cava is dilated in size with <50% respiratory  variability, suggesting right atrial pressure of 15 mmHg.   Epic records are reviewed at length today  CHA2DS2-VASc Score = 6  The patient's score is based upon: CHF History: Yes HTN History: No Diabetes History: No Stroke History: Yes Vascular Disease History: Yes Age Score: 2 Gender Score: 0      ASSESSMENT AND PLAN: 1. Persistent Atrial Fibrillation (ICD10:  I48.19) The patient's CHA2DS2-VASc score is 6, indicating a 9.7% annual  risk of stroke.   He has a history of persistent atrial fibrillation. This patient's CHA2DS2-VASc Score and unadjusted Ischemic Stroke Rate (% per year) is equal to 9.7 % stroke rate/year from a score of 6 which necessitates long term oral anticoagulation to prevent stroke. HasBled score is 4.  Unfortunately, He is not felt to be a long term anticoagulation candidate secondary to cerebral amyloid angiopathy. Procedural risks for the Watchman implant have again been reviewed with the patient including a 1% risk of stroke, 2% risk of perforation, 0.1% risk of device embolization. He is scheduled with Dr Burt Knack for [facility] implant on 05/29/20.  CT 05/01/20 showed left atrial appendage is chicken wing morphology with superior and medial rotation, without thrombus. A 27 mm watchman FLX device is recommended based on the abovelanding zone measurements (21.9 mm maximum diameter; 20% compression)  Check bmet/CBC Check CXR Start Eliquis 5 mg BID 5 days prior to implant, hold morning of procedure. Stop ASA when he starts Eliquis, will do Eliquis only for 45 days post implant, then Plavix monotherapy for 6 months.  2. Secondary Hypercoagulable State (ICD10:  D68.69) The patient is at significant risk for stroke/thromboembolism based upon his CHA2DS2-VASc Score of 6.  See plans above.  3. Severe Obstructive sleep apnea Patient going for in lab CPAP titration after Watchman implant.  4. CAD S/p CABG No anginal symptoms.   Follow up for Watchman implant on 05/29/20.   [facility] [address] [place], [place] [address] [phone] 05/21/2020 1:46 PM

## 2020-05-27 ENCOUNTER — Other Ambulatory Visit (HOSPITAL_COMMUNITY)
Admission: RE | Admit: 2020-05-27 | Discharge: 2020-05-27 | Disposition: A | Discharge status: Home / Self Care | Payer: Medicare Other | Source: Ambulatory Visit | Attending: Cardiovascular Disease | Admitting: Cardiovascular Disease

## 2020-05-27 DIAGNOSIS — Z01812 Encounter for preprocedural laboratory examination: Secondary | ICD-10-CM | POA: Diagnosis present

## 2020-05-27 DIAGNOSIS — Z20822 Contact with and (suspected) exposure to covid-19: Secondary | ICD-10-CM | POA: Insufficient documentation

## 2020-05-27 LAB — SARS CORONAVIRUS 2 (TAT 6-24 HRS): SARS Coronavirus 2: NEGATIVE

## 2020-05-28 NOTE — Progress Notes (Signed)
Anesthesia Chart Review:  Case: [id number] Date/Time: 05/29/20 0930   Procedures:      LEFT ATRIAL APPENDAGE OCCLUSION (N/A )     TRANSESOPHAGEAL ECHOCARDIOGRAM (TEE) (N/A )   Anesthesia type: General   Pre-op diagnosis: afib   Location: [facility] CATH LAB 6 / [place] INVASIVE CV LAB   Providers: Sherren Mocha, MD      DISCUSSION: Patient is an 86 year old male scheduled for the above procedure.   History includes never smoker, CAD & ascending TAA (CABG: LIMA-LAD, SVG-DX and ascending TAA repair using 32 mm Hemashield graft, STJ to ascending interposition graft with resuspension of AV 02/22/2019, Dr. 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  See my anesthesia APP note from Date of Service 11/08/19 for more details regarding prolonged hospitalization at [facility] followed by [facility] stay in [facility] (~02/2019 - ~ 06/2019), as well as summaries of last pulmonology (11/08/19) and neurology (08/15/19) notes that can also be viewed in [place].    Anticoagulation recommendations outlined by Malka So, PA on 05/21/20: "Start Eliquis 5 mg BID 5 days prior to implant, hold morning of procedure. Stop ASA when he starts Eliquis, will do Eliquis only for 45 days post implant, then Plavix monotherapy for 6 months."  05/27/20 presurgical COVID-19 test negative. Anesthesia team to evaluate on the day of surgery.   VS:  BP Readings from Last 3 Encounters:  05/21/20 102/60  03/17/20 130/60   03/10/20 128/70   Pulse Readings from Last 3 Encounters:  05/21/20 68  03/17/20 65  03/10/20 62    PROVIDERS: Willeen Niece, PA is PCP ([place]). Last visit 05/23/20.  Lyman Bishop, MD is cardiologist. Established care on 09/19/19.  Seen by Malka So, [place] 05/21/20 at the [facility]. Seeing Sherren Mocha, MD for Watchman implant scheduled for 05/29/20. Had sleep study with Shelva Majestic, MD 04/10/20. Dionne Milo, MD is pulmonologist ([place]). Last visit 11/08/19.  Cammy Brochure, MD is neurologist ([place]). Last visit 08/15/19.    LABS: Currently, most recent labs results include: Lab Results  Component Value Date   WBC 7.0 05/21/2020   HGB 13.6 05/21/2020   HCT 40.0 05/21/2020   PLT 168 05/21/2020   GLUCOSE 111 (H) 05/21/2020   NA 136 05/21/2020   K 4.0 05/21/2020   CL 101 05/21/2020   CREATININE 1.01 05/21/2020   BUN 18 05/21/2020   CO2 24 05/21/2020    OTHER:  Sleep Study 04/10/20: IMPRESSIONS - Severe obstructive sleep apnea occurred during this study (AHI 45.3/h). Events were worse with supine sleep (AHI 48.3/h) versus non-supine sleep (AHI 36.1/h) - No significant central sleep apnea occurred during this study (CAI = 0.1/h). - Moderate oxygen desaturation to a nadir of 82%. Time spent < 89% was 29.7 minutes. - Patient snored 0.8% during the sleep. RECOMMENDATIONS - In this patient with severe sleep apnea and significant cardiovascular co-morbidities recommend an in-lab CPAP titration study.Marland KitchenMarland KitchenRecommend a download and sleep clinic evaluation after initiation of therapy.  Respiratory Flow Volume Loop 11/08/2019 [facility]): Impression: Moderate obstruction.  Spirometry 08/14/2019 ([facility]): Impression: There is a  moderate fixed obstructive ventilatory defect. The TLC is mildly reduced. The diffusing capacity is moderately reduced. There is no significant response to bronchodilators.   Summary of [facility]  Pulmonary workup: "PFTs 02/2019: moderate restrictive ventilatory defect (TLC 64% pred), severe gas transfer defect (DLC 45% pred) and an acute response to bronchodilators.  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 LP (04/20/19) no evidence of Guillain-Barr syndrome on his CSF studies."  Continuous EEG with video 04/19/2019 [facility]): Summary  This is a mildly abnormal study due to:  - mild diffuse slowing of the awake background  Comment:  This record was indicative of a mild diffuse cerebral disturbance There  were no electrographic seizures.    IMAGES: CXR 05/21/20: FINDINGS: Prior median sternotomy and CABG. The heart size and mediastinal contours are within normal limits. Similar elevation the right hemidiaphragm. Right basilar subsegmental atelectasis. No pleural effusion. No pneumothorax. Bilateral glenohumeral degenerative change. IMPRESSION: Right basilar subsegmental atelectasis.  No pleural effusion.   EKG: 05/21/20: Atrial fibrillation at 68 bpm Nonspecific T wave abnormality Abnormal ECG Since last tracing rate slower Confirmed by Buford Dresser [phone]) on 05/24/2020 9:49:50 PM   CV: CT Coronary 05/01/20: IMPRESSION: 1. The left atrial appendage is chicken wing morphology with superior and medial rotation, without thrombus. 2. A 27 mm watchman FLX device is recommended based on the above landing zone measurements (21.9 mm maximum diameter; 20% compression). 3. An inferior posterior IAS puncture site is recommended. 4. Dilated ascending aorta just distal to the ascending aorta graft, measuring 48 mm.  Echo 12/21/19: IMPRESSIONS  1. Left ventricular ejection fraction, by estimation, is 60 to 65%. The  left ventricle has normal function.  The left ventricle has no regional  wall motion abnormalities. Left ventricular diastolic function could not  be evaluated. There is the  interventricular septum is flattened in diastole ('D' shaped left  ventricle), consistent with right ventricular volume overload.  2. Right ventricular systolic function is mildly reduced. The right  ventricular size is severely enlarged. There is mildly elevated pulmonary  artery systolic pressure. The estimated right ventricular systolic  pressure is 03.5 mmHg.  3. Left atrial size was mild to moderately dilated.  4. Right atrial size was severely dilated.  5. The mitral valve is normal in structure. Mild to moderate mitral valve  regurgitation.  6. Tricuspid valve regurgitation is severe.  7. The aortic valve is tricuspid. Aortic valve regurgitation is mild.  Mild aortic valve sclerosis is present, with no evidence of aortic valve  stenosis.  8. The ascending aorta is not well visualized. There is mild dilatation  of the aortic root, measuring 42 mm.  9. The inferior vena cava is dilated in size with <50% respiratory  variability, suggesting right atrial pressure of 15 mmHg.  - Comparison(s): Prior Study done in [place].   Cardiac cath 02/15/2019 [facility], pre-CABG/TAA Repair): 1. The coronary circulation is right dominant. All coronary arteries are  of normal caliber for age.  2. The left main is short and free of focal disease.  3. The LAD is a relatively short vessel that does not wrap the apex. The  LAD supplies a very large proximal diagonal branch. The diagonal branch  has 95% stenosis at its origin and appears graftable. The proximal LAD has  irregular 40-50% stenosis. The mid-LAD beyond the LAD  diagonal has a 3 cm  long region of diffuse 50-70% stenosis. The LAD thereafter appears  graftable.  4. The LCx supplies two large marginal branches. The LCx system is free of  focal disease.  5. The RCA supplies a large  hyperdominant PDA and two large posterolateral  branches. The RCA system is free of focal disease.  6. Extreme brachial artery loop in the upper arm, traversed with a  prolapsed Magic wire.   Past Medical History:  Diagnosis Date  . Acute on chronic respiratory failure with hypoxia ([place])   . BPH (benign prostatic hyperplasia)   . CAD (coronary artery disease)   . Cancer ([place])    Melonoma on left shoulder blade removed  . Cerebral amyloid angiopathy ([place])   . Chronic atrial fibrillation ([place])   . Chronic congestive heart failure with left ventricular diastolic dysfunction ([place])   . Diaphragmatic paralysis   . Dyspnea   . Dysrhythmia   . ED (erectile dysfunction)   . Hepatitis    Hep A  . History of DVT of lower extremity    BLE DVT ~ 04/2019  . Pneumonia   . Stroke (cerebrum) ([place]) 03/2019   STROKE X 2    Past Surgical History:  Procedure Laterality Date  . ASCENDING AORTIC ANEURYSM REPAIR  02/22/2019   32 mm Hemashild graft, STJ to ascending interposition graft, resuspension of AV [facility])  . BACK SURGERY    . CARDIAC CATHETERIZATION  02/15/2019   [facility]  . CORONARY ARTERY BYPASS GRAFT  02/22/2019   LIMA to LAD, SVG to D1 [facility])  . EYE SURGERY Bilateral    Cataract surgery  . INGUINAL HERNIA REPAIR Right 11/14/2019   Procedure: RIGHT INGUINAL HERNIA REPAIR WITH MESH;  Surgeon: Donnie Mesa, MD;  Location: [place];  Service: General;  Laterality: Right;  LMA AND TAP BLOCK  . JOINT REPLACEMENT Bilateral    knee replacement  . L3-L4 Back surgery    . PEG TUBE PLACEMENT    . sciatic nerve cyst removal    . TONSILLECTOMY    . TRACHEOSTOMY      MEDICATIONS: No current facility-administered medications for this encounter.   Marland Kitchen acetaminophen (TYLENOL) 500 MG tablet  . Ascorbic Acid (VITAMIN C) 500 MG CAPS  . atorvastatin (LIPITOR) 80 MG tablet  . furosemide (LASIX) 40 MG tablet  . Liniments (DEEP BLUE RELIEF) GEL  . loratadine  (CLARITIN) 10 MG tablet  . metoprolol tartrate (LOPRESSOR) 25 MG tablet  . Multiple Vitamins-Minerals (MULTIVITAMIN ADULT EXTRA C PO)  . Multiple Vitamins-Minerals (PRESERVISION AREDS 2 PO)  . Olopatadine HCl (PATADAY OP)  . omeprazole (PRILOSEC) 20 MG capsule  . tamsulosin (FLOMAX) 0.4 MG CAPS capsule  . apixaban (ELIQUIS) 5 MG TABS tablet  . tadalafil (CIALIS) 5 MG tablet    Myra Gianotti, PA-C Surgical Short Stay/Anesthesiology [facility] Phone [phone] [facility] Phone [phone] 05/28/2020 3:26 PM

## 2020-05-28 NOTE — Anesthesia Preprocedure Evaluation (Addendum)
Anesthesia Evaluation    Reviewed: Allergy & Precautions, Patient's Chart, lab work & pertinent test results  History of Anesthesia Complications Negative for: history of anesthetic complications  Airway Mallampati: II  TM Distance: >3 FB Neck ROM: Full    Dental  (+) Dental Advisory Given, Teeth Intact   Pulmonary   Hx diaphragmatic paralysis Chronic hypoxic respiratory failure Hx tracheostomy 05/2019, decannulated 07/2019      Pulmonary exam normal        Cardiovascular + CAD, + CABG and + DVT  Normal cardiovascular exam+ dysrhythmias Atrial Fibrillation + Valvular Problems/Murmurs MR    S/p CABG, TAA repair  '21 TTE - EF 60 to 65%. The interventricular septum is flattened in diastole ('D' shaped left ventricle), consistent with right ventricular volume overload. Right ventricular systolic function is mildly reduced. The right ventricular size is severely enlarged. There is mildly elevated pulmonary artery systolic pressure. Left atrial size was mild to moderately dilated. Right atrial size was severely dilated. Mild to moderate mitral valve regurgitation. Tricuspid valve regurgitation is severe. Aortic valve regurgitation is mild. Mild aortic valve sclerosis is present, with no evidence of aortic valve stenosis.     Neuro/Psych  Cerebral amyloid angiopathy  CVA, Residual Symptoms negative psych ROS   GI/Hepatic GERD  Medicated and Controlled,(+) Hepatitis -, A  Endo/Other  negative endocrine ROS  Renal/GU negative Renal ROS     Musculoskeletal negative musculoskeletal ROS (+)   Abdominal   Peds  Hematology  On eliquis    Anesthesia Other Findings Covid test negative   Reproductive/Obstetrics                          Anesthesia Physical Anesthesia Plan  ASA: III  Anesthesia Plan: General   Post-op Pain Management:    Induction: Intravenous  PONV Risk Score and Plan: 2 and  Treatment may vary due to age or medical condition and Ondansetron  Airway Management Planned: Oral ETT  Additional Equipment: Arterial line and TEE  Intra-op Plan:   Post-operative Plan: Extubation in OR  Informed Consent: I have reviewed the patients History and Physical, chart, labs and discussed the procedure including the risks, benefits and alternatives for the proposed anesthesia with the patient or authorized representative who has indicated his/her understanding and acceptance.     Dental advisory given  Plan Discussed with: CRNA and Anesthesiologist  Anesthesia Plan Comments: (Small caliber ETT given previous trach and recent use of small ETT  )      Anesthesia Quick Evaluation

## 2020-05-29 ENCOUNTER — Inpatient Hospital Stay (HOSPITAL_BASED_OUTPATIENT_CLINIC_OR_DEPARTMENT_OTHER): Payer: Medicare Other

## 2020-05-29 ENCOUNTER — Inpatient Hospital Stay (HOSPITAL_COMMUNITY): Payer: Medicare Other | Admitting: Physician Assistant

## 2020-05-29 ENCOUNTER — Other Ambulatory Visit: Payer: Self-pay

## 2020-05-29 ENCOUNTER — Encounter (HOSPITAL_COMMUNITY)
Admission: RE | Disposition: A | Discharge status: Home / Self Care | Payer: Self-pay | Source: Home / Self Care | Attending: Cardiovascular Disease

## 2020-05-29 ENCOUNTER — Ambulatory Visit (HOSPITAL_COMMUNITY)
Admission: RE | Admit: 2020-05-29 | Discharge: 2020-05-29 | Disposition: A | Discharge status: Home / Self Care | Payer: Medicare Other | Attending: Cardiovascular Disease | Admitting: Cardiovascular Disease

## 2020-05-29 ENCOUNTER — Encounter (HOSPITAL_COMMUNITY): Payer: Self-pay | Admitting: Cardiovascular Disease

## 2020-05-29 DIAGNOSIS — I4811 Longstanding persistent atrial fibrillation: Secondary | ICD-10-CM

## 2020-05-29 DIAGNOSIS — Z7982 Long term (current) use of aspirin: Secondary | ICD-10-CM | POA: Insufficient documentation

## 2020-05-29 DIAGNOSIS — I2510 Atherosclerotic heart disease of native coronary artery without angina pectoris: Secondary | ICD-10-CM | POA: Diagnosis not present

## 2020-05-29 DIAGNOSIS — I680 Cerebral amyloid angiopathy: Secondary | ICD-10-CM | POA: Insufficient documentation

## 2020-05-29 DIAGNOSIS — Z79899 Other long term (current) drug therapy: Secondary | ICD-10-CM | POA: Insufficient documentation

## 2020-05-29 DIAGNOSIS — D6869 Other thrombophilia: Secondary | ICD-10-CM | POA: Diagnosis not present

## 2020-05-29 DIAGNOSIS — Z951 Presence of aortocoronary bypass graft: Secondary | ICD-10-CM | POA: Insufficient documentation

## 2020-05-29 DIAGNOSIS — Z005 Encounter for examination of potential donor of organ and tissue: Secondary | ICD-10-CM

## 2020-05-29 DIAGNOSIS — Z7901 Long term (current) use of anticoagulants: Secondary | ICD-10-CM | POA: Diagnosis not present

## 2020-05-29 DIAGNOSIS — I4819 Other persistent atrial fibrillation: Secondary | ICD-10-CM | POA: Insufficient documentation

## 2020-05-29 DIAGNOSIS — G4733 Obstructive sleep apnea (adult) (pediatric): Secondary | ICD-10-CM | POA: Diagnosis not present

## 2020-05-29 DIAGNOSIS — Z006 Encounter for examination for normal comparison and control in clinical research program: Secondary | ICD-10-CM | POA: Diagnosis not present

## 2020-05-29 DIAGNOSIS — E854 Organ-limited amyloidosis: Secondary | ICD-10-CM | POA: Diagnosis not present

## 2020-05-29 HISTORY — PX: TEE WITHOUT CARDIOVERSION: SHX5443

## 2020-05-29 HISTORY — PX: LEFT ATRIAL APPENDAGE OCCLUSION: EP1229

## 2020-05-29 LAB — SURGICAL PCR SCREEN
MRSA, PCR: NEGATIVE
Staphylococcus aureus: POSITIVE — AB

## 2020-05-29 LAB — POCT ACTIVATED CLOTTING TIME: Activated Clotting Time: 356 seconds

## 2020-05-29 LAB — TYPE AND SCREEN
ABO/RH(D): O POS
Antibody Screen: NEGATIVE

## 2020-05-29 LAB — ABO/RH: ABO/RH(D): O POS

## 2020-05-29 SURGERY — LEFT ATRIAL APPENDAGE OCCLUSION
Anesthesia: General

## 2020-05-29 MED ORDER — MUPIROCIN 2 % EX OINT: 1.0000 "application " | TOPICAL_OINTMENT | Freq: Two times a day (BID) | CUTANEOUS | Status: DC

## 2020-05-29 MED ORDER — IOHEXOL 350 MG/ML SOLN
INTRAVENOUS | Status: DC | PRN
  Administered 2020-05-29: 100 mL

## 2020-05-29 MED ORDER — CHLORHEXIDINE GLUCONATE CLOTH 2 % EX PADS: 6.0000 | MEDICATED_PAD | Freq: Every day | CUTANEOUS | Status: DC

## 2020-05-29 MED ORDER — CHLORHEXIDINE GLUCONATE 0.12 % MT SOLN
15.0000 mL | Freq: Once | OROMUCOSAL | Status: AC
  Filled 2020-05-29: qty 15

## 2020-05-29 MED ORDER — HEPARIN SODIUM (PORCINE) 1000 UNIT/ML IJ SOLN
INTRAMUSCULAR | Status: DC | PRN
  Administered 2020-05-29: 11000 [IU] via INTRAVENOUS

## 2020-05-29 MED ORDER — PROPOFOL 10 MG/ML IV BOLUS
INTRAVENOUS | Status: DC | PRN
  Administered 2020-05-29: 120 mg via INTRAVENOUS

## 2020-05-29 MED ORDER — FENTANYL CITRATE (PF) 250 MCG/5ML IJ SOLN
INTRAMUSCULAR | Status: DC | PRN
  Administered 2020-05-29: 50 ug via INTRAVENOUS

## 2020-05-29 MED ORDER — ROCURONIUM BROMIDE 10 MG/ML (PF) SYRINGE
PREFILLED_SYRINGE | INTRAVENOUS | Status: DC | PRN
  Administered 2020-05-29: 10 mg via INTRAVENOUS
  Administered 2020-05-29: 50 mg via INTRAVENOUS

## 2020-05-29 MED ORDER — HEPARIN (PORCINE) IN NACL 1000-0.9 UT/500ML-% IV SOLN
INTRAVENOUS | Status: AC
  Filled 2020-05-29: qty 500

## 2020-05-29 MED ORDER — ASPIRIN EC 81 MG PO TBEC: 81.0000 mg | DELAYED_RELEASE_TABLET | Freq: Every day | ORAL | 2 refills | Status: AC

## 2020-05-29 MED ORDER — SODIUM CHLORIDE 0.9 % IV SOLN: 250.0000 mL | INTRAVENOUS | Status: DC | PRN

## 2020-05-29 MED ORDER — MUPIROCIN 2 % EX OINT
TOPICAL_OINTMENT | CUTANEOUS | Status: AC
  Administered 2020-05-29: 1 via NASAL
  Filled 2020-05-29: qty 22

## 2020-05-29 MED ORDER — PROTAMINE SULFATE 10 MG/ML IV SOLN
INTRAVENOUS | Status: DC | PRN
  Administered 2020-05-29: 30 mg via INTRAVENOUS

## 2020-05-29 MED ORDER — CEFAZOLIN SODIUM-DEXTROSE 2-4 GM/100ML-% IV SOLN
2.0000 g | INTRAVENOUS | Status: AC
  Administered 2020-05-29: 2 g via INTRAVENOUS
  Filled 2020-05-29: qty 100

## 2020-05-29 MED ORDER — ONDANSETRON HCL 4 MG/2ML IJ SOLN
INTRAMUSCULAR | Status: DC | PRN
  Administered 2020-05-29: 4 mg via INTRAVENOUS

## 2020-05-29 MED ORDER — SUGAMMADEX SODIUM 200 MG/2ML IV SOLN
INTRAVENOUS | Status: DC | PRN
  Administered 2020-05-29: 200 mg via INTRAVENOUS

## 2020-05-29 MED ORDER — ONDANSETRON HCL 4 MG/2ML IJ SOLN: 4.0000 mg | Freq: Four times a day (QID) | INTRAMUSCULAR | Status: DC | PRN

## 2020-05-29 MED ORDER — HEPARIN (PORCINE) IN NACL 1000-0.9 UT/500ML-% IV SOLN
INTRAVENOUS | Status: DC | PRN
  Administered 2020-05-29 (×3): 500 mL

## 2020-05-29 MED ORDER — EPHEDRINE SULFATE 50 MG/ML IJ SOLN
INTRAMUSCULAR | Status: DC | PRN
  Administered 2020-05-29 (×5): 10 mg via INTRAVENOUS

## 2020-05-29 MED ORDER — PHENYLEPHRINE HCL-NACL 10-0.9 MG/250ML-% IV SOLN
INTRAVENOUS | Status: DC | PRN
  Administered 2020-05-29: 50 ug/min via INTRAVENOUS

## 2020-05-29 MED ORDER — ACETAMINOPHEN 325 MG PO TABS: 650.0000 mg | ORAL_TABLET | ORAL | Status: DC | PRN

## 2020-05-29 MED ORDER — LIDOCAINE 2% (20 MG/ML) 5 ML SYRINGE
INTRAMUSCULAR | Status: DC | PRN
  Administered 2020-05-29: 60 mg via INTRAVENOUS

## 2020-05-29 MED ORDER — CHLORHEXIDINE GLUCONATE 0.12 % MT SOLN
OROMUCOSAL | Status: AC
  Administered 2020-05-29: 15 mL via OROMUCOSAL
  Filled 2020-05-29: qty 15

## 2020-05-29 MED ORDER — SODIUM CHLORIDE 0.9 % IV SOLN: INTRAVENOUS | Status: DC

## 2020-05-29 MED ORDER — SODIUM CHLORIDE 0.9% FLUSH: 3.0000 mL | Freq: Two times a day (BID) | INTRAVENOUS | Status: DC

## 2020-05-29 MED ORDER — HEPARIN (PORCINE) IN NACL 1000-0.9 UT/500ML-% IV SOLN
INTRAVENOUS | Status: AC
  Filled 2020-05-29: qty 1000

## 2020-05-29 MED ORDER — DEXAMETHASONE SODIUM PHOSPHATE 10 MG/ML IJ SOLN
INTRAMUSCULAR | Status: DC | PRN
  Administered 2020-05-29: 8 mg via INTRAVENOUS

## 2020-05-29 MED ORDER — SODIUM CHLORIDE 0.9% FLUSH: 3.0000 mL | INTRAVENOUS | Status: DC | PRN

## 2020-05-29 MED ORDER — LACTATED RINGERS IV SOLN: INTRAVENOUS | Status: DC | PRN

## 2020-05-29 SURGICAL SUPPLY — 20 items
BAG SNAP BAND KOVER 36X36 (MISCELLANEOUS) ×2 IMPLANT
CATH DIAG 6FR PIGTAIL ANGLED (CATHETERS) ×2 IMPLANT
CLOSURE MYNX CONTROL 5F (Vascular Products) ×2 IMPLANT
CLOSURE PERCLOSE PROSTYLE (VASCULAR PRODUCTS) ×4 IMPLANT
COVER DOME SNAP 22 D (MISCELLANEOUS) ×2 IMPLANT
KIT DILATOR VASC 18G NDL (KITS) ×2 IMPLANT
KIT HEART LEFT (KITS) ×2 IMPLANT
KIT MICROPUNCTURE NIT STIFF (SHEATH) ×2 IMPLANT
KIT VERSACROSS LRG ACCESS (CATHETERS) ×2 IMPLANT
MAT PREVALON FULL STRYKER (MISCELLANEOUS) ×2 IMPLANT
PACK CARDIAC CATHETERIZATION (CUSTOM PROCEDURE TRAY) ×2 IMPLANT
PAD PRO RADIOLUCENT 2001M-C (PAD) ×2 IMPLANT
SHEATH PERFORMER 16FR 30 (SHEATH) ×2 IMPLANT
SHEATH PINNACLE 5F 10CM (SHEATH) ×2 IMPLANT
SHEATH PINNACLE 8F 10CM (SHEATH) ×2 IMPLANT
SHEATH PROBE COVER 6X72 (BAG) ×4 IMPLANT
SHIELD RADPAD SCOOP 12X17 (MISCELLANEOUS) ×2 IMPLANT
TUBING CIL FLEX 10 FLL-RA (TUBING) ×2 IMPLANT
WATCHMAN PROCED TRUSEAL ACCESS (SHEATH) ×2 IMPLANT
WATCHMAN TRUSEAL ANTER CURVE (SHEATH) ×2 IMPLANT

## 2020-05-29 NOTE — Discharge Instructions (Signed)
Angiogram, Care After This sheet gives you information about how to care for yourself after your procedure. Your health care provider may also give you more specific instructions. If you have problems or questions, contact your health care provider. What can I expect after the procedure? After the procedure, it is common to have:  Bruising and tenderness at the catheter insertion area.  A collection of blood (hematoma) at the insertion area. This may feel like a small lump under the skin at the insertion site. Follow these instructions at home: Insertion site care  Follow instructions from your health care provider about how to take care of your insertion site. Make sure you: ? Wash your hands with soap and water before and after you change your bandage (dressing). If soap and water are not available, use hand sanitizer. ? Change your dressing as told by your health care provider.  Do not take baths, swim, or use a hot tub until your health care provider approves.  You may shower 24-48 hours after the procedure, or as told by your health care provider. To clean the insertion site: ? Gently wash the area with plain soap and water. ? Pat the area dry with a clean towel. ? Do not rub the site. This may cause bleeding.  Check your insertion site every day for signs of infection. Check for: ? Redness, swelling, or pain. ? Fluid or blood. ? Warmth. ? Pus or a bad smell.  Do not apply powder or lotion to the site. Keep the site clean and dry.   Activity  Do not drive for 24 hours if you were given a sedative during your procedure.  Rest as told by your health care provider, usually for 1-2 days.  Do not lift anything that is heavier than 10 lb (4.5 kg), or the limit that you are told, until your health care provider says that it is safe.  If the insertion site was in your leg, try to avoid stairs for a few days.  Return to your normal activities as told by your health care provider,  usually in about a week. Ask your health care provider what activities are safe for you. General instructions  If your insertion site starts bleeding, lie flat and put pressure on the site. If the bleeding does not stop, get help right away. This is a medical emergency.  Take over-the-counter and prescription medicines only as told by your health care provider.  Drink enough fluid to keep your urine pale yellow. This helps flush the contrast dye from your body.  Keep all follow-up visits as told by your health care provider. This is important.   Contact a health care provider if:  You have a fever or chills.  You have redness, swelling, or pain around your insertion site.  You have fluid or blood coming from your insertion site.  Your insertion site feels warm to the touch.  You have pus or a bad smell coming from your insertion site.  You have more bruising around the insertion site. Get help right away if you have:  A problem with the insertion area, such as: ? The area swells fast or bleeds even after you apply pressure. ? The area becomes pale, cool, tingly, or numb.  Chest pain.  Trouble breathing.  A rash.  Any symptoms of a stroke. "BE FAST" is an easy way to remember the main warning signs of a stroke: ? B - Balance. Signs are dizziness, sudden trouble walking,   or loss of balance. ? E - Eyes. Signs are trouble seeing or a sudden change in vision. ? F - Face. Signs are sudden weakness or loss of feeling of the face, or the face or eyelid drooping on one side. ? A - Arms. Signs are weakness or loss of feeling in an arm. This happens suddenly and usually on one side of the body. ? S - Speech. Signs are sudden trouble speaking, slurred speech, or trouble understanding what people say. ? T - Time. Time to call emergency services. Write down what time symptoms started.  You have other signs of a stroke, such as: ? A sudden, severe headache with no known cause. ? Nausea  or vomiting. ? Seizure. These symptoms may represent a serious problem that is an emergency. Do not wait to see if the symptoms will go away. Get medical help right away. Call your local emergency services (911 in the [place] Do not drive yourself to the hospital. Summary  It is common to have bruising and tenderness at the catheter insertion area.  Do not take baths, swim, or use a hot tub until your health care provider approves. You may shower 24-48 hours after the procedure or as told.  It is important to rest and drink plenty of fluids.  If the insertion site bleeds, lie flat and put pressure on the site. If the bleeding continues, get help right away. This is a medical emergency. This information is not intended to replace advice given to you by your health care provider. Make sure you discuss any questions you have with your health care provider. Document Revised: 01/31/2019 Document Reviewed: 01/31/2019 Elsevier Patient Education  2021 [place].  Mupirocin nasal ointment What is this medicine? MUPIROCIN CALCIUM (myoo PEER oh sin KAL see um) is an antibiotic. It is used on the skin or inside the nose. It treats or prevents skin infections caused by bacteria. This medicine may be used for other purposes; ask your health care provider or pharmacist if you have questions. COMMON BRAND NAME(S): Bactroban What should I tell my health care provider before I take this medicine? They need to know if you have any of these conditions:  an unusual or allergic reaction to mupirocin, other medicines, foods, dyes, or preservatives  pregnant or trying to get pregnant  breast-feeding How should I use this medicine? This medicine is only for use inside the nose. Follow the directions on the prescription label. Wash your hands before and after use. Squeeze half the contents of a single-use tube into one nostril, then squeeze the other half into the other nostril. Press the sides of your nose  together and gently massage after application to spread the ointment throughout the nostrils. Do not use your medicine more often than directed. Finish the full course of medicine prescribed by your doctor or health care professional even if you think your condition is better. Talk to your pediatrician regarding the use of this medicine in children. Special care may be needed. Overdosage: If you think you have taken too much of this medicine contact a poison control center or emergency room at once. NOTE: This medicine is only for you. Do not share this medicine with others. What if I miss a dose? If you miss a dose, take it as soon as you can. If it is almost time for your next dose, take only that dose. Do not take double or extra doses. What may interact with this medicine? Interactions are not  expected. Do not use any other nose products without telling your doctor or health care professional. This list may not describe all possible interactions. Give your health care provider a list of all the medicines, herbs, non-prescription drugs, or dietary supplements you use. Also tell them if you smoke, drink alcohol, or use illegal drugs. Some items may interact with your medicine. What should I watch for while using this medicine? If your nose is severely irritated, burning or stinging from use of this medicine, stop using it and contact your doctor or health care professional. Do not get this medicine in your eyes. If you do, rinse out with plenty of cool tap water. What side effects may I notice from receiving this medicine? Side effects that you should report to your doctor or health care professional as soon as possible:  severe irritation, burning, stinging, or pain Side effects that usually do not require medical attention (report to your doctor or health care professional if they continue or are bothersome):  altered taste  cough  headache  skin itching  sore throat  stuffy or runny  nose This list may not describe all possible side effects. Call your doctor for medical advice about side effects. You may report side effects to FDA at 1-800-FDA-1088. Where should I keep my medicine? Keep out of the reach of children. Store at room temperature between 15 and 30 degrees C (59 and 86 degrees F). Do not refrigerate. One tube of ointment is for single use in both nostrils. Throw away after use. NOTE: This sheet is a summary. It may not cover all possible information. If you have questions about this medicine, talk to your doctor, pharmacist, or health care provider.  2021 Elsevier/Gold Standard (2019-05-31 21:28:37)

## 2020-05-29 NOTE — Transfer of Care (Signed)
Immediate Anesthesia Transfer of Care Note  Patient: Travis Brewer  Procedure(s) Performed: LEFT ATRIAL APPENDAGE OCCLUSION (N/A ) TRANSESOPHAGEAL ECHOCARDIOGRAM (TEE) (N/A )  Patient Location: PACU and Cath Lab  Anesthesia Type:General  Level of Consciousness: awake, alert  and oriented  Airway & Oxygen Therapy: Patient Spontanous Breathing and Patient connected to nasal cannula oxygen  Post-op Assessment: Report given to RN, Post -op Vital signs reviewed and stable and Patient moving all extremities X 4  Post vital signs: Reviewed and stable  Last Vitals:  Vitals Value Taken Time  BP 113/72 05/29/20 1251  Temp 36.5 C 05/29/20 1234  Pulse 69 05/29/20 1256  Resp 20 05/29/20 1256  SpO2 94 % 05/29/20 1256  Vitals shown include unvalidated device data.  Last Pain:  Vitals:   05/29/20 1252  TempSrc:   PainSc: 0-No pain      Patients Stated Pain Goal: 4 (95/09/32 6712)  Complications: No complications documented.

## 2020-05-29 NOTE — Progress Notes (Signed)
Up and walked and tolerated well; right groin stable, no bleeding or hematoma 

## 2020-05-29 NOTE — Anesthesia Procedure Notes (Signed)
Procedure Name: Intubation Date/Time: 05/29/2020 10:23 AM Performed by: Mariea Clonts, CRNA Pre-anesthesia Checklist: Patient identified, Emergency Drugs available, Suction available and Patient being monitored Patient Re-evaluated:Patient Re-evaluated prior to induction Oxygen Delivery Method: Circle System Utilized Preoxygenation: Pre-oxygenation with 100% oxygen Induction Type: IV induction Ventilation: Mask ventilation without difficulty and Oral airway inserted - appropriate to patient size Laryngoscope Size: Sabra Heck and 2 Grade View: Grade II Tube type: Oral Tube size: 6.5 mm Number of attempts: 1 Airway Equipment and Method: Stylet and Oral airway Placement Confirmation: ETT inserted through vocal cords under direct vision,  positive ETCO2 and breath sounds checked- equal and bilateral Tube secured with: Tape Dental Injury: Teeth and Oropharynx as per pre-operative assessment

## 2020-05-29 NOTE — Progress Notes (Signed)
  Echocardiogram Echocardiogram Transesophageal has been performed.  Darlina Sicilian M 05/29/2020, 12:39 PM

## 2020-05-29 NOTE — Procedures (Signed)
[place] Procedure   Surgeon: Sherren Mocha, MD Co-Surgeon: Lars Mage, MD Imager: Sanda Klein, MD   Procedure: 1) Transseptal Puncture 2) Perclose right femoral vein   Device Implant: No device implanted.   Background and Indication: 86 yo man with atrial fibrillation and cerebral amyloid angiopathy, felt to be a poor candidate for long term anticoagulation. He is referred for Watchman implantation.    Procedure description: US guidance is used for right femoral venous access. [place] images are stored digitally in the patient's chart. Double Preclose is performed at 10" and 2" positions using normal technique and a 16 Fr sheath is inserted. Transseptal puncture is then performed after fully anticoagulating the patient with unfractionated heparin. A Versacross system is used with RF energy to cross the interatrial septum under fluoroscopic and TEE guidance. Please see Dr Antionette Char detailed report for further description of transseptal punctures and Watchman Access Sheath insertion.   Once in the left atrium, we encountered significant difficulty positioning the delivery sheath into an acceptable location. We performed a second transseptal access to achieve a different angle of approach into the left atrial appendage but still were not able to appropriately position the sheath due to a severely anterior and superior left atrial appendage ostia.    The sheath was removed and hemostasis achieved with perclose devices and manual pressure.    EBL: minimal   Pt is transferred to the recovery area following the procedure with no early apparent complications.     Lysbeth Galas T. Quentin Ore, MD, [facility] Cardiac Electrophysiology

## 2020-05-29 NOTE — Interval H&P Note (Signed)
History and Physical Interval Note:  05/29/2020 9:44 AM  [name]  has presented today for surgery, with the diagnosis of afib.  The various methods of treatment have been discussed with the patient and family. After consideration of risks, benefits and other options for treatment, the patient has consented to  Procedure(s): LEFT ATRIAL APPENDAGE OCCLUSION (N/A) TRANSESOPHAGEAL ECHOCARDIOGRAM (TEE) (N/A) as a surgical intervention.  The patient's history has been reviewed, patient examined, no change in status, stable for surgery.  I have reviewed the patient's chart and labs.  Questions were answered to the patient's satisfaction.    I have personally evaluated the patient. I have also spoken with the patient's PCP and his neurologist about Watchman LAAO to limit chronic oral anticoagulation in the setting of cerebral amyloid angiopathy. Patient's questions are answered.   Sherren Mocha

## 2020-05-29 NOTE — Anesthesia Postprocedure Evaluation (Signed)
Anesthesia Post Note  Patient: Travis Brewer  Procedure(s) Performed: LEFT ATRIAL APPENDAGE OCCLUSION (N/A ) TRANSESOPHAGEAL ECHOCARDIOGRAM (TEE) (N/A )     Patient location during evaluation: PACU Anesthesia Type: General Level of consciousness: awake and alert Pain management: pain level controlled Vital Signs Assessment: post-procedure vital signs reviewed and stable Respiratory status: spontaneous breathing, nonlabored ventilation and respiratory function stable Cardiovascular status: blood pressure returned to baseline and stable Postop Assessment: no apparent nausea or vomiting Anesthetic complications: no   No complications documented.  Last Vitals:  Vitals:   05/29/20 1637 05/29/20 1737  BP: 124/75 119/66  Pulse: 78 78  Resp: (!) 22 (!) 21  Temp:    SpO2: 98% 98%    Last Pain:  Vitals:   05/29/20 1411  TempSrc:   PainSc: 0-No pain                 Audry Pili

## 2020-05-30 ENCOUNTER — Encounter (HOSPITAL_COMMUNITY): Payer: Self-pay | Admitting: Cardiovascular Disease

## 2020-06-02 ENCOUNTER — Encounter: Payer: Self-pay | Admitting: Neurology

## 2020-06-02 ENCOUNTER — Telehealth: Payer: Self-pay

## 2020-06-02 DIAGNOSIS — I680 Cerebral amyloid angiopathy: Secondary | ICD-10-CM

## 2020-06-02 DIAGNOSIS — E854 Organ-limited amyloidosis: Secondary | ICD-10-CM

## 2020-06-02 NOTE — Telephone Encounter (Signed)
Referral placed for Dr. Tomi Likens.

## 2020-06-02 NOTE — Telephone Encounter (Signed)
-----   Message from Sherren Mocha, MD sent at 05/31/2020  6:58 AM EST ----- Travis Brewer - his wife requested referral to a different neurologist (has been seen at [facility]). Could you refer him to Dr Tomi Likens at [facility] neurology for evaluation of cerebral amyloid angiopathy?  thx

## 2020-06-02 NOTE — Telephone Encounter (Signed)
The patient has been scheduled 08/05/20 with Dr. Tomi Likens.

## 2020-07-11 ENCOUNTER — Telehealth: Payer: Self-pay | Admitting: Internal Medicine

## 2020-07-11 NOTE — Telephone Encounter (Signed)
Returned a call to the patient to answer his questions in reference to the inspire device. Patient was told that most if not all insurance companies require that you try and fail CPAP before they will cover this device.also you have to have moderate to severe OSA with a BMI less than 33. Patient states that he heard that Medicare will cover it. I told him that may be true, however he would need to meet all of the qualifications. He states that he will call medicare to see what they tell him and call me again on Monday 07/14/20. My direct phone contact information has been provided to the patient.

## 2020-07-11 NOTE — Telephone Encounter (Signed)
Patient called to talk with Dr. Debara Pickett or nurse. Please call back

## 2020-07-11 NOTE — Telephone Encounter (Signed)
Called patient, he states that he was told he had sleep apnea but was going through other things with Dr.Cooper and now he is ready to handle the sleep apnea issue. He mentions to me that Travis Brewer had CPAP machines recalled and does not want to use CPAP, refuses to go this route- he would like to inquire information about inspire and would like for Dr.Hilty to send him to someone who knows a lot about this.  I advised I would have to send a message and we could get back with him on any information regarding this.  Patient verbalized understanding.

## 2020-07-11 NOTE — Telephone Encounter (Signed)
Message also routed to [facility] sleep coordinator Mariann Laster for any information regarding Dawna Part

## 2020-07-17 ENCOUNTER — Telehealth: Payer: Self-pay | Admitting: *Deleted

## 2020-07-17 NOTE — Telephone Encounter (Signed)
Patient called in to inform me that with the information  He has read about the recall on the Respironics machine and the fact that he is not sure that he will meet the inspire qualifications he has decided against the use of CPAP to treat his OSA. I explained to him that he would not be getting a Respironics machine. He would be getting a Psychologist, sport and exercise. I also went over with the patient how OSA can possibly affect his health if left untreated. He also states that he read that the inspire and the CPAP machine keeps you from waking up at night and he "does not wake up" at night. I explained to him , no you do not wake up. When you have a AHI it takes you out of your REM sleep. He again declines CPAP treatment. He states that if he decides at a later time to give it a try, he will call me. The providers will be notified of his decision. They may need to call and speak with him in order to get him to understand that he has severe sleep apnea and it needs to be treated. If he waits too long he may have to start the process over with a new sleep study and office visit.

## 2020-07-18 NOTE — Telephone Encounter (Signed)
Thanks Mariann Laster for discussing this with him  Dr Lemmie Evens

## 2020-08-05 ENCOUNTER — Ambulatory Visit: Payer: Medicare Other | Admitting: Neurology

## 2020-08-07 NOTE — Progress Notes (Addendum)
NEUROLOGY CONSULTATION NOTE  [name] MRN: [id number] DOB: 05/14/1934  Referring provider: Sherren Mocha, MD Primary care provider: Willeen Niece, PA  Reason for consult:  Cerebral amyloid angiopathy  Assessment/Plan:   1.  Cerebral amyloid angiopathy 2.  History of stroke in setting of new-onset a fib 3.  Atrial fibrillation  1.  Due to increased risk of ICH, favor continuing ASA 81mg  daily and avoiding anticoagulation if possible 2.  Continue statin therapy 3.  Continue physical therapy/exercise 4.  Mediterranean diet 5.  Continue socialization/reading/book club 6.  Follow up 6 months  Subjective:  Travis Brewer is an 86 year old right-handed male with CAD, a fib, CHF, history of stroke and DVT who presents for cerebral amyloid angiopathy.  History supplemented by prior neurology and referring provider notes.  He is accompanied by his wife who also provides some history.    He was hospitalized at [facility] in November 2020 for AAA repair and CABG.  He was found to have atrial fibrillation and acute left frontal infarct.  Imaging also demonstrated multiple microhemorrhages concerning for cerebral amyloid angiopathy, as well as chronic small vessel ischemic changes and multiple chronic infarcts within the right cerebellar hemisphere. CTA head and neck revealed no significant large vessel stenosis or occlusion. Due to risk of ICH, he was maintained on antiplatelet therapy rather than starting anticoagulation.  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  Over the course of 3 months he required reintubation 5 times.  He failed the Watchman device so he continues on ASA 81mg  daily.  He underwent extensive rehab.  He still participates in physical therapy working on balance and core strength.  He is active and participates in book club.  He sleeps well.  Denies problems with memory.  He has chronic right foot drop due to  complication of prior lumbar spine surgery.    PAST MEDICAL HISTORY: Past Medical History:  Diagnosis Date  . Acute on chronic respiratory failure with hypoxia ([place])   . BPH (benign prostatic hyperplasia)   . CAD (coronary artery disease)   . Cancer ([place])    Melonoma on left shoulder blade removed  . Cerebral amyloid angiopathy ([place])   . Chronic atrial fibrillation ([place])   . Chronic congestive heart failure with left ventricular diastolic dysfunction ([place])   . Diaphragmatic paralysis   . Dyspnea   . Dysrhythmia   . ED (erectile dysfunction)   . Hepatitis    Hep A  . History of DVT of lower extremity    BLE DVT ~ 04/2019  . Pneumonia   . Stroke (cerebrum) ([place]) 03/2019   STROKE X 2    PAST SURGICAL HISTORY: Past Surgical History:  Procedure Laterality Date  . ASCENDING AORTIC ANEURYSM REPAIR  02/22/2019   32 mm Hemashild graft, STJ to ascending interposition graft, resuspension of AV [facility])  . BACK SURGERY    . CARDIAC CATHETERIZATION  02/15/2019   [facility]  . CORONARY ARTERY BYPASS GRAFT  02/22/2019   LIMA to LAD, SVG to D1 [facility])  . EYE SURGERY Bilateral    Cataract surgery  . INGUINAL HERNIA REPAIR Right 11/14/2019   Procedure: RIGHT INGUINAL HERNIA REPAIR WITH MESH;  Surgeon: Donnie Mesa, MD;  Location: [place];  Service: General;  Laterality: Right;  LMA AND TAP BLOCK  . JOINT REPLACEMENT Bilateral    knee replacement  . L3-L4 Back surgery    . LEFT  ATRIAL APPENDAGE OCCLUSION N/A 05/29/2020   Procedure: LEFT ATRIAL APPENDAGE OCCLUSION;  Surgeon: Sherren Mocha, MD;  Location: [place] CV LAB;  Service: Cardiovascular;  Laterality: N/A;  . PEG TUBE PLACEMENT    . sciatic nerve cyst removal    . TEE WITHOUT CARDIOVERSION N/A 05/29/2020   Procedure: TRANSESOPHAGEAL ECHOCARDIOGRAM (TEE);  Surgeon: Sherren Mocha, MD;  Location: [place] CV LAB;  Service: Cardiovascular;  Laterality: N/A;  . TONSILLECTOMY    .  TRACHEOSTOMY      MEDICATIONS: Current Outpatient Medications on File Prior to Visit  Medication Sig Dispense Refill  . acetaminophen (TYLENOL) 500 MG tablet Take 500 mg by mouth 2 (two) times daily.    . Ascorbic Acid (VITAMIN C) 500 MG CAPS Take 500 mg by mouth daily.     Marland Kitchen aspirin EC 81 MG tablet Take 1 tablet (81 mg total) by mouth daily. Swallow whole. 150 tablet 2  . atorvastatin (LIPITOR) 80 MG tablet Take 1 tablet (80 mg total) by mouth daily. 90 tablet 3  . furosemide (LASIX) 40 MG tablet Take 1 tablet (40 mg total) by mouth daily. 90 tablet 3  . Liniments (DEEP BLUE RELIEF) GEL Apply 1 application topically at bedtime.    Marland Kitchen loratadine (CLARITIN) 10 MG tablet Take 10 mg by mouth daily as needed for allergies.    . metoprolol tartrate (LOPRESSOR) 25 MG tablet Take 25 mg by mouth 2 (two) times daily.     . Multiple Vitamins-Minerals (MULTIVITAMIN ADULT EXTRA C PO) Take 1 tablet by mouth daily.    . Multiple Vitamins-Minerals (PRESERVISION AREDS 2 PO) Take 1 tablet by mouth 2 (two) times daily.    . Olopatadine HCl (PATADAY OP) Place 1 drop into both eyes daily as needed (allergies).    Marland Kitchen omeprazole (PRILOSEC) 20 MG capsule Take 20 mg by mouth daily.    . tadalafil (CIALIS) 5 MG tablet Take 5 mg by mouth daily.    . tamsulosin (FLOMAX) 0.4 MG CAPS capsule Take 0.4 mg by mouth daily.      No current facility-administered medications on file prior to visit.    ALLERGIES: Allergies  Allergen Reactions  . Quetiapine Nausea And Vomiting    When taken with clonazepam (seprately they are fine)  . Clonazepam Nausea And Vomiting    When taken with quetiapine (seprately they are fine)    FAMILY HISTORY: Family History  Problem Relation Age of Onset  . Diabetes Mother   . Stroke Father     Objective:  Blood pressure 128/73, pulse 61, height 5\' 9"  (1.753 m), weight 159 lb (72.1 kg), SpO2 97 %. General: No acute distress.  Patient appears well-groomed.   Head:   Normocephalic/atraumatic Eyes:  fundi examined but not visualized Neck: supple, no paraspinal tenderness, full range of motion Back: No paraspinal tenderness Heart: regular rate, irregular rhythm Lungs: Clear to auscultation bilaterally. Vascular: No carotid bruits. Neurological Exam: Mental status:  [employer] Mental Exam 08/08/2020  Weekday Correct 1  Current year 1  What state are we in? 1  Amount spent 0  Amount left 0  # of Animals 2  5 objects recall 4  Number series 2  Hour markers 2  Time correct 2  Placed X in triangle correctly 1  Largest Figure 1  Name of male 2  Date back to work 0  Type of work 2  State she lived in 2  Total score 23   Cranial nerves: CN I: not tested CN  II: pupils equal, round and reactive to light, visual fields intact CN III, IV, VI:  full range of motion, no nystagmus, no ptosis CN V: facial sensation intact. CN VII: upper and lower face symmetric CN VIII: hearing intact CN IX, X: gag intact, uvula midline CN XI: sternocleidomastoid and trapezius muscles intact CN XII: tongue midline Bulk & Tone: normal, no fasciculations. Motor:  Decreased left arm abduction due to torn rotator cuff; muscle strength 0/5 right ankle dorsiflexion/plantarflexion/EHL, 5-/5 bilateral hand grip; otherwise 5/5 throughout Sensation:  Pinprick sensation reduced up to knees bilaterally, vibratory sensation reduced to ankles. Deep Tendon Reflexes:  2+ throughout,  toes downgoing.   Finger to nose testing:  Without dysmetria.    Gait:  Wide-based steppage gait.  Romberg with mild sway.    Thank you for allowing me to take part in the care of this patient.  Metta Clines, DO  CC:  Sherren Mocha, MD  Willeen Niece, [place]

## 2020-08-08 ENCOUNTER — Ambulatory Visit (INDEPENDENT_AMBULATORY_CARE_PROVIDER_SITE_OTHER): Payer: Medicare Other | Admitting: Neurology

## 2020-08-08 ENCOUNTER — Encounter: Payer: Self-pay | Admitting: Neurology

## 2020-08-08 ENCOUNTER — Other Ambulatory Visit: Payer: Self-pay

## 2020-08-08 VITALS — BP 128/73 | HR 61 | Ht 69.0 in | Wt 159.0 lb

## 2020-08-08 DIAGNOSIS — I482 Chronic atrial fibrillation, unspecified: Secondary | ICD-10-CM | POA: Diagnosis not present

## 2020-08-08 DIAGNOSIS — Z8673 Personal history of transient ischemic attack (TIA), and cerebral infarction without residual deficits: Secondary | ICD-10-CM

## 2020-08-08 DIAGNOSIS — M21371 Foot drop, right foot: Secondary | ICD-10-CM | POA: Diagnosis not present

## 2020-08-08 DIAGNOSIS — I680 Cerebral amyloid angiopathy: Secondary | ICD-10-CM | POA: Diagnosis not present

## 2020-08-08 NOTE — Patient Instructions (Addendum)
1.  Continue aspirin 81mg  daily 2.  Continue statin therapy 3.  Continue physical therapy/exercise 4.  Mediterranean diet 5.  Continue reading/book club 6.  Follow up 6 months.    Mediterranean Diet A Mediterranean diet refers to food and lifestyle choices that are based on the traditions of countries located on the [employer]. This way of eating has been shown to help prevent certain conditions and improve outcomes for people who have chronic diseases, like kidney disease and heart disease. What are tips for following this plan? Lifestyle  Cook and eat meals together with your family, when possible.  Drink enough fluid to keep your urine clear or pale yellow.  Be physically active every day. This includes: ? Aerobic exercise like running or swimming. ? Leisure activities like gardening, walking, or housework.  Get 7-8 hours of sleep each night.  If recommended by your health care provider, drink red wine in moderation. This means 1 glass a day for nonpregnant women and 2 glasses a day for men. A glass of wine equals 5 oz (150 mL). Reading food labels  Check the serving size of packaged foods. For foods such as rice and pasta, the serving size refers to the amount of cooked product, not dry.  Check the total fat in packaged foods. Avoid foods that have saturated fat or trans fats.  Check the ingredients list for added sugars, such as corn syrup.   Shopping  At the grocery store, buy most of your food from the areas near the walls of the store. This includes: ? Fresh fruits and vegetables (produce). ? Grains, beans, nuts, and seeds. Some of these may be available in unpackaged forms or large amounts (in bulk). ? Fresh seafood. ? Poultry and eggs. ? Low-fat dairy products.  Buy whole ingredients instead of prepackaged foods.  Buy fresh fruits and vegetables in-season from local farmers markets.  Buy frozen fruits and vegetables in resealable bags.  If you do not  have access to quality fresh seafood, buy precooked frozen shrimp or canned fish, such as tuna, salmon, or sardines.  Buy small amounts of raw or cooked vegetables, salads, or olives from the deli or salad bar at your store.  Stock your pantry so you always have certain foods on hand, such as olive oil, canned tuna, canned tomatoes, rice, pasta, and beans. Cooking  Cook foods with extra-virgin olive oil instead of using butter or other vegetable oils.  Have meat as a side dish, and have vegetables or grains as your main dish. This means having meat in small portions or adding small amounts of meat to foods like pasta or stew.  Use beans or vegetables instead of meat in common dishes like chili or lasagna.  Experiment with different cooking methods. Try roasting or broiling vegetables instead of steaming or sauteing them.  Add frozen vegetables to soups, stews, pasta, or rice.  Add nuts or seeds for added healthy fat at each meal. You can add these to yogurt, salads, or vegetable dishes.  Marinate fish or vegetables using olive oil, lemon juice, garlic, and fresh herbs. Meal planning  Plan to eat 1 vegetarian meal one day each week. Try to work up to 2 vegetarian meals, if possible.  Eat seafood 2 or more times a week.  Have healthy snacks readily available, such as: ? Vegetable sticks with hummus. ? [place] yogurt. ? Fruit and nut trail mix.  Eat balanced meals throughout the week. This includes: ? Fruit: 2-3 servings a  day ? Vegetables: 4-5 servings a day ? Low-fat dairy: 2 servings a day ? Fish, poultry, or lean meat: 1 serving a day ? Beans and legumes: 2 or more servings a week ? Nuts and seeds: 1-2 servings a day ? Whole grains: 6-8 servings a day ? Extra-virgin olive oil: 3-4 servings a day  Limit red meat and sweets to only a few servings a month   What are my food choices?  Mediterranean diet ? Recommended  Grains: Whole-grain pasta. Brown rice. Bulgar wheat.  Polenta. Couscous. Whole-wheat bread. [place].  Vegetables: Artichokes. Beets. Broccoli. Cabbage. Carrots. Eggplant. Green beans. Chard. Kale. Spinach. Onions. Leeks. Peas. Squash. Tomatoes. Peppers. Radishes.  Fruits: Apples. Apricots. Avocado. Berries. Bananas. Cherries. Dates. Figs. Grapes. Lemons. Melon. Oranges. Peaches. Plums. Pomegranate.  Meats and other protein foods: Beans. Almonds. Sunflower seeds. Pine nuts. Peanuts. [place]. Salmon. Scallops. Shrimp. [place]. Tilapia. Clams. Oysters. Eggs.  Dairy: Low-fat milk. Cheese. Greek yogurt.  Beverages: Water. Red wine. Herbal tea.  Fats and oils: Extra virgin olive oil. Avocado oil. Grape seed oil.  Sweets and desserts: [place] yogurt with honey. Baked apples. Poached pears. Trail mix.  Seasoning and other foods: Basil. Cilantro. Coriander. Cumin. Mint. Parsley. Sage. Rosemary. Tarragon. Garlic. Oregano. Thyme. Pepper. Balsalmic vinegar. Tahini. Hummus. Tomato sauce. Olives. Mushrooms. ? Limit these  Grains: Prepackaged pasta or rice dishes. Prepackaged cereal with added sugar.  Vegetables: Deep fried potatoes (french fries).  Fruits: Fruit canned in syrup.  Meats and other protein foods: Beef. Pork. Lamb. Poultry with skin. Hot dogs. Berniece Salines.  Dairy: Ice cream. Sour cream. Whole milk.  Beverages: Juice. Sugar-sweetened soft drinks. Beer. Liquor and spirits.  Fats and oils: Butter. Canola oil. Vegetable oil. Beef fat (tallow). Lard.  Sweets and desserts: Cookies. Cakes. Pies. Candy.  Seasoning and other foods: Mayonnaise. Premade sauces and marinades. The items listed may not be a complete list. Talk with your dietitian about what dietary choices are right for you. Summary  The Mediterranean diet includes both food and lifestyle choices.  Eat a variety of fresh fruits and vegetables, beans, nuts, seeds, and whole grains.  Limit the amount of red meat and sweets that you eat.  Talk with your health care provider about  whether it is safe for you to drink red wine in moderation. This means 1 glass a day for nonpregnant women and 2 glasses a day for men. A glass of wine equals 5 oz (150 mL). This information is not intended to replace advice given to you by your health care provider. Make sure you discuss any questions you have with your health care provider. Document Revised: 11/27/2015 Document Reviewed: 11/20/2015 Elsevier Patient Education  [employer].

## 2020-09-01 ENCOUNTER — Ambulatory Visit: Payer: Medicare Other | Admitting: Neurology

## 2020-11-25 ENCOUNTER — Other Ambulatory Visit: Payer: Self-pay

## 2020-11-25 ENCOUNTER — Other Ambulatory Visit (HOSPITAL_COMMUNITY): Payer: Self-pay

## 2020-11-25 ENCOUNTER — Encounter: Payer: Self-pay | Admitting: Internal Medicine

## 2020-11-25 ENCOUNTER — Ambulatory Visit (INDEPENDENT_AMBULATORY_CARE_PROVIDER_SITE_OTHER): Payer: Medicare Other | Admitting: Internal Medicine

## 2020-11-25 VITALS — BP 120/78 | HR 65 | Ht 67.0 in | Wt 169.0 lb

## 2020-11-25 DIAGNOSIS — I2510 Atherosclerotic heart disease of native coronary artery without angina pectoris: Secondary | ICD-10-CM | POA: Diagnosis not present

## 2020-11-25 DIAGNOSIS — Z951 Presence of aortocoronary bypass graft: Secondary | ICD-10-CM | POA: Diagnosis not present

## 2020-11-25 DIAGNOSIS — E854 Organ-limited amyloidosis: Secondary | ICD-10-CM | POA: Diagnosis not present

## 2020-11-25 DIAGNOSIS — I4821 Permanent atrial fibrillation: Secondary | ICD-10-CM

## 2020-11-25 DIAGNOSIS — Z9889 Other specified postprocedural states: Secondary | ICD-10-CM

## 2020-11-25 DIAGNOSIS — Z8679 Personal history of other diseases of the circulatory system: Secondary | ICD-10-CM

## 2020-11-25 DIAGNOSIS — I680 Cerebral amyloid angiopathy: Secondary | ICD-10-CM

## 2020-11-25 MED ORDER — APIXABAN 5 MG PO TABS: 5.0000 mg | ORAL_TABLET | Freq: Two times a day (BID) | ORAL | 0 refills | Status: DC

## 2020-11-25 NOTE — Patient Instructions (Signed)
  Follow-Up: At [facility], you and your health needs are our priority.  As part of our continuing mission to provide you with exceptional heart care, we have created designated Provider Care Teams.  These Care Teams include your primary Cardiologist (physician) and Advanced Practice Providers (APPs -  Physician Assistants and Nurse Practitioners) who all work together to provide you with the care you need, when you need it.  We recommend signing up for the patient portal called "MyChart".  Sign up information is provided on this After Visit Summary.  MyChart is used to connect with patients for Virtual Visits (Telemedicine).  Patients are able to view lab/test results, encounter notes, upcoming appointments, etc.  Non-urgent messages can be sent to your provider as well.   To learn more about what you can do with MyChart, go to [email or web address].    Your next appointment:   12 month(s)  The format for your next appointment:   In Person  Provider:   K. Mali Hilty, MD

## 2020-11-25 NOTE — Progress Notes (Signed)
OFFICE NOTE  Chief Complaint:  Follow-up  Primary Care Physician: Willeen Niece, PA  HPI:  Travis Brewer is a 86 y.o. male with a past medial history significant for recent diagnosis of ascending aortic aneurysm status post urgent elective surgery for aneurysm repair and possible replacement of a bicuspid aortic valve.  He underwent surgery at [facility] on 02/22/2019, undergoing an aneurysm replacement, resuspension of the aortic valve and CABG x2 with LIMA to LAD, SVG to D1.  Surgery was complicated by cardiac arrest that occurred shortly after sternotomy thought to be related to air embolism.  Ultimately the patient was successfully placed on bypass, the groin was explored and thought to be the source of air introduction during vein harvest.  Surgery then proceeded as scheduled however postoperatively was complicated by atrial fibrillation, 2 strokes, failure to wean off of the ventilator and ultimate placement of a trach and PEG.  He was subsequently transferred to the [facility] in [place] and spent most of March 2021 there, ultimately eventually being weaned off of the ventilator and having his PEG tube reversed.  He presents today with his wife is also a new patient of mine to establish cardiology care.  He was seen in the hospital by Dr. Doylene Canard, one of the local cardiologist, who had added digoxin for aid in A. fib rate control as well as possible RV inotropic stimulation.  He noted that the patient's chads vas score was 5 however anticoagulation was not recommended.  In fact I cannot understand why the patient has not been anticoagulated as there is no history of significant contraindication to this, but he did have 2 strokes and has had what appears to be persistent atrial fibrillation since surgery.  There is no evidence of left atrial appendage closure in fact it seemed that the surgery went pretty emergently after his arrest.  Recently Travis Brewer has been  complaining of positional dizziness, especially when bending over and sitting up too quickly or turning over to one side.  Blood pressure today was noted to be low at 104/72.  EKG showed that he is in A. fib with controlled ventricular response at 70-personally reviewed.  Other than this he reports he is slowly getting back to somewhat normal activities.  He did undergo some speech therapy.  He had an echo that was performed prior to bypass which showed normal LVEF and a subsequent echo in January which showed preserved LV function.  He denies any MI during the hospitalization.  He has no chest pain or shortness of breath.  He reports fatigue but it is difficult to know whether this is related to his A. fib.  The last echo in January while showing normal LVEF did show moderate to severe biatrial enlargement, suggesting that his A. fib may be challenging to rhythm control.  12/25/2019  Travis Brewer returns today for follow-up.  He recently underwent uncomplicated mesh hernia repair by Dr. Georgette Dover which went very well.  Apparently was discharged same day.  He has done well recovering.  He is interested in getting reestablished with some physical therapy to improve his strength.  He did undergo a repeat EF 60 to 65% without regional wall motion abnormalities however there are signs of right ventricular volume overload with mildly elevated pulmonary artery systolic pressure and RVSP of 38 mmHg.  The left atrium is mild to moderately dilated and the right atrium is severely dilated.  There is mild to moderate MR and severe TR.  There is mild AI.  The aortic root is dilated at 42 mm without evidence of a sending aortic dilatation.  The ascending aorta as mentioned was grafted.  The IVC was dilated with elevated right atrial pressure 15 mmHg.  Despite these echo findings, Travis Brewer says he feels well.  He denies any shortness of breath with exertion, chest pain or associated symptoms.  He is not currently anticoagulated.  We  previously discussed going on Eliquis for anticoagulation however he is concerned about the risk for intracranial bleeding.  He remains on low-dose aspirin however this is not sufficient to protect him from stroke.  We discussed alternatives to anticoagulation including possible left atrial appendage occlusion with a watchman device, however he wished to consider that further and is not interested in anticoagulation at this time.  03/10/2020  Travis Brewer is seen today in follow-up.  Echo in September showed normal LVEF 60 to 65% with signs of RV volume overload.  There was only mild pulmonary hypertension with an RVSP of 38 mmHg.  There was mild to moderate left atrial enlargement and severe right atrial enlargement with mild to moderate MR and severe TR.  The aortic root was dilated at 4.2 cm.  He continues to have no complaints of worsening shortness of breath.  He was noted to have mildly elevated BNP and I had recommended increasing his Lasix up to 40 mg daily.  He seems to be tolerating this and had no significant change in his creatinine with the increase in the Lasix.  We again discussed the fact that he is not anticoagulated.  He is in persistent if not longstanding persistent atrial fibrillation.  He had concerns about anticoagulation and is not willing to take it due to his prior risk of intracranial bleeding.  We again discussed the watchman procedure and he is now interested in further evaluation.  Also, his wife who was at the visit today mentioned that he had been noticed to have witnessed episodes of apnea.  It also sounds like possibly a Cheyne Stokes pattern of respiration where she said after about every 8 or 9 breaths he had stopped breathing.  Reportedly he had a sleep study in [place] but never got the results of that.  11/25/2020  Travis Brewer returns today for follow-up.  He was referred for watchman left atrial appendage occlusion device.  Unfortunately after 2 attempts this could not be  successfully placed and the procedure was aborted.  He has seen neurology and because of his cerebral amyloid angiopathy, he is at high risk for bleeding on anticoagulation and therefore will remain on low-dose aspirin.  He is in likely permanent atrial fibrillation at this point.  He is asymptomatic with it and rate controlled.  He exercises regularly and says he is improving daily with regards to physical therapy.  PMHx:  Past Medical History:  Diagnosis Date   Acute on chronic respiratory failure with hypoxia (HCC)    BPH (benign prostatic hyperplasia)    CAD (coronary artery disease)    Cancer ([place])    Melonoma on left shoulder blade removed   Cerebral amyloid angiopathy (HCC)    Chronic atrial fibrillation (HCC)    Chronic congestive heart failure with left ventricular diastolic dysfunction (HCC)    Diaphragmatic paralysis    Dyspnea    Dysrhythmia    ED (erectile dysfunction)    Hepatitis    Hep A   History of DVT of lower extremity    BLE DVT ~  04/2019   Pneumonia    Stroke (cerebrum) ([place]) 03/2019   STROKE X 2    FAMHx:  Family History  Problem Relation Age of Onset   Diabetes Mother    Stroke Father     SOCHx:   reports that he has never smoked. He has never used smokeless tobacco. He reports that he does not use drugs. No history on file for alcohol use.  ALLERGIES:  Allergies  Allergen Reactions   Quetiapine Nausea And Vomiting    When taken with clonazepam (seprately they are fine)   Clonazepam Nausea And Vomiting    When taken with quetiapine (seprately they are fine)    ROS: Pertinent items noted in HPI and remainder of comprehensive ROS otherwise negative.  HOME MEDS: Current Outpatient Medications on File Prior to Visit  Medication Sig Dispense Refill   acetaminophen (TYLENOL) 500 MG tablet Take 500 mg by mouth 2 (two) times daily.     Ascorbic Acid (VITAMIN C) 500 MG CAPS Take 500 mg by mouth daily.      aspirin EC 81 MG tablet Take 1 tablet (81 mg  total) by mouth daily. Swallow whole. 150 tablet 2   atorvastatin (LIPITOR) 80 MG tablet Take 1 tablet (80 mg total) by mouth daily. 90 tablet 3   furosemide (LASIX) 40 MG tablet Take 1 tablet (40 mg total) by mouth daily. 90 tablet 3   Liniments (DEEP BLUE RELIEF) GEL Apply 1 application topically at bedtime.     loratadine (CLARITIN) 10 MG tablet Take 10 mg by mouth daily as needed for allergies.     metoprolol tartrate (LOPRESSOR) 25 MG tablet Take 25 mg by mouth 2 (two) times daily.      Multiple Vitamins-Minerals (MULTIVITAMIN ADULT EXTRA C PO) Take 1 tablet by mouth daily.     Multiple Vitamins-Minerals (PRESERVISION AREDS 2 PO) Take 1 tablet by mouth 2 (two) times daily.     omeprazole (PRILOSEC) 20 MG capsule Take 20 mg by mouth daily.     tadalafil (CIALIS) 5 MG tablet Take 5 mg by mouth daily.     No current facility-administered medications on file prior to visit.    LABS/IMAGING: No results found for this or any previous visit (from the past 48 hour(s)). No results found.  LIPID PANEL: No results found for: CHOL, TRIG, HDL, CHOLHDL, VLDL, LDLCALC, LDLDIRECT   WEIGHTS: Wt Readings from Last 3 Encounters:  11/25/20 169 lb (76.7 kg)  08/08/20 159 lb (72.1 kg)  05/21/20 164 lb 6.4 oz (74.6 kg)    VITALS: BP 120/78 (BP Location: Right Arm)   Pulse 65   Ht '5\' 7"'$  (1.702 m)   Wt 169 lb (76.7 kg)   SpO2 96%   BMI 26.47 kg/m   EXAM: General appearance: alert, no distress and pale Neck: no carotid bruit, no JVD and thyroid not enlarged, symmetric, no tenderness/mass/nodules Lungs: clear to auscultation bilaterally Heart: irregularly irregular rhythm Abdomen: soft, non-tender; bowel sounds normal; no masses,  no organomegaly Extremities: extremities normal, atraumatic, no cyanosis or edema Pulses: 2+ and symmetric Skin: Skin color, texture, turgor normal. No rashes or lesions Neurologic: Grossly normal Psych: Pleasant  EKG: A. fib at 65-personally  reviewed  ASSESSMENT: Status post ascending aneurysm replacement (02/2019-[facility]) CAD status post CABG x2 (LIMA to LAD, SVG to D1)-11/20, [facility] hospital Intraprocedural cardiac arrest secondary to possible air embolus Prolonged ventilation necessitating trach and PEG[facility] Longstanding persistent atrial fibrillation postoperative  with moderate to severe biatrial enlargement normal LV function by echo in January 2021, mild to moderate RV hypokinesis, CHADSVASC Score of 5 -not anticoagulated due to concern for intracranial bleeding risk. Dyslipidemia BPH ED LVEF 60 to 65%, mild to moderate MR, severe TR (12/2019) Cerebral amyloid angiopathy  PLAN: 1.   Travis Brewer unfortunately could not have the watchman procedure due to placement issues.  After 2 attempts the procedure was aborted.  He will remain on low-dose aspirin because of the increased risk of bleeding due to cerebral amyloid angiopathy.  He remains in rate controlled A. fib.  Blood pressure is well controlled.  He exercises regularly without issues.  No changes to his medicines today.  Plan follow-up with me annually or sooner as necessary.  Pixie Casino, MD, [facility], [place] Director of the Advanced Lipid Disorders &  Cardiovascular Risk Reduction Clinic Diplomate of the American Board of Clinical Lipidology Attending Cardiologist  Direct Dial: [phone]  Fax: [phone]  Website:  [email or web address] 11/25/2020, 9:36 AM

## 2021-01-11 ENCOUNTER — Other Ambulatory Visit: Payer: Self-pay | Admitting: Internal Medicine

## 2021-02-06 ENCOUNTER — Telehealth: Payer: Self-pay | Admitting: Internal Medicine

## 2021-02-06 MED ORDER — FUROSEMIDE 40 MG PO TABS: ORAL_TABLET | ORAL | 2 refills | Status: DC

## 2021-02-06 NOTE — Telephone Encounter (Signed)
Spoke with pt. After speaking with pt, pt checked his current prescription of Lasix that was sent in this month and doesn't need a 14 day supply. Pt current has a 90 day supply which was originally sent to [facility]. New RX for Lasix was sent to [facility] per pts request. Pharmacy was updated in pts chart.

## 2021-02-06 NOTE — Telephone Encounter (Signed)
*  STAT* If patient is at the pharmacy, call can be transferred to refill team.   1. Which medications need to be refilled? (please list name of each medication and dose if known)  furosemide (LASIX) 40 MG tablet  2. Which pharmacy/location (including street and city if local pharmacy) is medication to be sent to? [id number] - [place], [place] - [address]  3. Do they need a 30 day or 90 day supply? PT IS REQUESTING A 14 DAY EMERGENCY SUPPLY    A SEPARATE REQUEST HAS BEEN SENT TO [facility]

## 2021-02-06 NOTE — Telephone Encounter (Signed)
Duplicate phone note. Call has been addressed. See other phone note for documentation.

## 2021-02-06 NOTE — Telephone Encounter (Signed)
*  STAT* If patient is at the pharmacy, call can be transferred to refill team.   1. Which medications need to be refilled? (please list name of each medication and dose if known) furosemide (LASIX) 40 MG tablet  2. Which pharmacy/location (including street and city if local pharmacy) is medication to be sent to? [facility] [place], [place]   3. Do they need a 30 day or 90 day supply?  90 DAY SUPPLY  MOVING FORWARD PLEASE SEND THIS SCRIPT TO [facility] ONLY

## 2021-02-09 NOTE — Progress Notes (Signed)
NEUROLOGY FOLLOW UP OFFICE NOTE  GRIFFEN FRAYNE [id number]  Assessment/Plan:   Cerebral amyloid angiopathy History of stroke in setting of atrial fibrillation Atrial fibrillation  Continue ASA 81mg  daily for secondary stroke prevention.  Due to increased risk of ICH in setting of CAA, not on anticoagulation.  Risks and benefits discussed Secondary stroke prevention as managed by PCP/cardiology:  statin therapy, normotensive blood pressure, glycemic control Continue exercise Follow up as needed.   Subjective:  BLYTHE HARTSHORN is an 86 year old right-handed male with CAD, a fib, CHF, history of stroke and DVT who follows up for cerebral amyloid angiopathy. He is accompanied by his wife who also provides some history.    UPDATE: Current medications:  ASA 81mg  daily, atorvastatin 80mg , Lopressor, furosemide  Due to increased risk of ICH, he continued ASA but not on AC.  Walking has improved - balance feels improved, feels stronger in the legs.  He works out 2 to 3 times a week at [employer] - walking, biking, [employer].     HISTORY: He was hospitalized at [facility] in November 2020 for AAA repair and CABG.  He was found to have atrial fibrillation and acute left frontal infarct.  Imaging also demonstrated multiple microhemorrhages concerning for cerebral amyloid angiopathy, as well as chronic small vessel ischemic changes and multiple chronic infarcts within the right cerebellar hemisphere. CTA head and neck revealed no significant large vessel stenosis or occlusion. Due to risk of ICH, he was maintained on antiplatelet therapy rather than starting anticoagulation.  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  Over the course of 3 months he required reintubation 5 times.  He failed the Watchman device so he continues on ASA 81mg  daily.  He underwent extensive rehab.  He still participates in physical therapy working on balance and  core strength.  He is active and participates in book club.  He sleeps well.  Denies problems with memory.  He has chronic right foot drop due to complication of prior lumbar spine surgery.  PAST MEDICAL HISTORY: Past Medical History:  Diagnosis Date   Acute on chronic respiratory failure with hypoxia (HCC)    BPH (benign prostatic hyperplasia)    CAD (coronary artery disease)    Cancer ([place])    Melonoma on left shoulder blade removed   Cerebral amyloid angiopathy (HCC)    Chronic atrial fibrillation (HCC)    Chronic congestive heart failure with left ventricular diastolic dysfunction (HCC)    Diaphragmatic paralysis    Dyspnea    Dysrhythmia    ED (erectile dysfunction)    Hepatitis    Hep A   History of DVT of lower extremity    BLE DVT ~ 04/2019   Pneumonia    Stroke (cerebrum) ([place]) 03/2019   STROKE X 2    MEDICATIONS: Current Outpatient Medications on File Prior to Visit  Medication Sig Dispense Refill   acetaminophen (TYLENOL) 500 MG tablet Take 500 mg by mouth 2 (two) times daily.     apixaban (ELIQUIS) 5 MG TABS tablet Take 1 tablet (5 mg total) by mouth 2 (two) times daily. 28 tablet 0   Ascorbic Acid (VITAMIN C) 500 MG CAPS Take 500 mg by mouth daily.      aspirin EC 81 MG tablet Take 1 tablet (81 mg total) by mouth daily. Swallow whole. 150 tablet 2   atorvastatin (LIPITOR) 80 MG tablet Take 1 tablet (80 mg total) by mouth daily.  90 tablet 3   furosemide (LASIX) 40 MG tablet TAKE 1 TABLET(40 MG) BY MOUTH DAILY 90 tablet 2   Liniments (DEEP BLUE RELIEF) GEL Apply 1 application topically at bedtime.     loratadine (CLARITIN) 10 MG tablet Take 10 mg by mouth daily as needed for allergies.     metoprolol tartrate (LOPRESSOR) 25 MG tablet Take 25 mg by mouth 2 (two) times daily.      Multiple Vitamins-Minerals (MULTIVITAMIN ADULT EXTRA C PO) Take 1 tablet by mouth daily.     Multiple Vitamins-Minerals (PRESERVISION AREDS 2 PO) Take 1 tablet by mouth 2 (two) times daily.      omeprazole (PRILOSEC) 20 MG capsule Take 20 mg by mouth daily.     tadalafil (CIALIS) 5 MG tablet Take 5 mg by mouth daily.     No current facility-administered medications on file prior to visit.    ALLERGIES: Allergies  Allergen Reactions   Quetiapine Nausea And Vomiting    When taken with clonazepam (seprately they are fine)   Clonazepam Nausea And Vomiting    When taken with quetiapine (seprately they are fine)    FAMILY HISTORY: Family History  Problem Relation Age of Onset   Diabetes Mother    Stroke Father       Objective:  Blood pressure 124/73, pulse 68, height 5\' 7"  (1.702 m), weight 165 lb (74.8 kg), SpO2 97 %. General: No acute distress.  Patient appears well-groomed.   Head:  Normocephalic/atraumatic Eyes:  Fundi examined but not visualized Neck: supple, no paraspinal tenderness, full range of motion Heart:  Regular rate, irregular rhythm Lungs:  Clear to auscultation bilaterally Back: No paraspinal tenderness Neurological Exam: alert and oriented to person, place, and time.  Speech fluent and not dysarthric, language intact.  CN II-XII intact. Decreased left arm abduction due to torn rotator cuff; muscle strength 0/5 right ankle dorsiflexion/plantarflexion/EHL, 5-/5 bilateral hand grip; otherwise 5/5 throughout.  Deep tendon reflexes 2+ throughout, toes downgoing.  Finger to nose testing intact.  Wide-based steppage gait.  Romberg with mild sway.   Metta Clines, DO  CC: Willeen Niece, [place]

## 2021-02-10 ENCOUNTER — Other Ambulatory Visit: Payer: Self-pay

## 2021-02-10 ENCOUNTER — Encounter: Payer: Self-pay | Admitting: Neurology

## 2021-02-10 ENCOUNTER — Ambulatory Visit (INDEPENDENT_AMBULATORY_CARE_PROVIDER_SITE_OTHER): Payer: Medicare Other | Admitting: Neurology

## 2021-02-10 VITALS — BP 124/73 | HR 68 | Ht 67.0 in | Wt 165.0 lb

## 2021-02-10 DIAGNOSIS — I2510 Atherosclerotic heart disease of native coronary artery without angina pectoris: Secondary | ICD-10-CM | POA: Diagnosis not present

## 2021-02-10 DIAGNOSIS — I680 Cerebral amyloid angiopathy: Secondary | ICD-10-CM

## 2021-02-10 DIAGNOSIS — I482 Chronic atrial fibrillation, unspecified: Secondary | ICD-10-CM | POA: Diagnosis not present

## 2021-02-10 NOTE — Patient Instructions (Signed)
At this point, you can follow up with me as needed

## 2021-03-05 IMAGING — DX DG CHEST 1V PORT
1 series · 1 of 1 positions shown · non-contrast
Comparison: Radiographs 06/06/2019 and 06/05/2019.

CLINICAL DATA: Respiratory failure.

EXAM:
PORTABLE CHEST 1 VIEW

[chest]
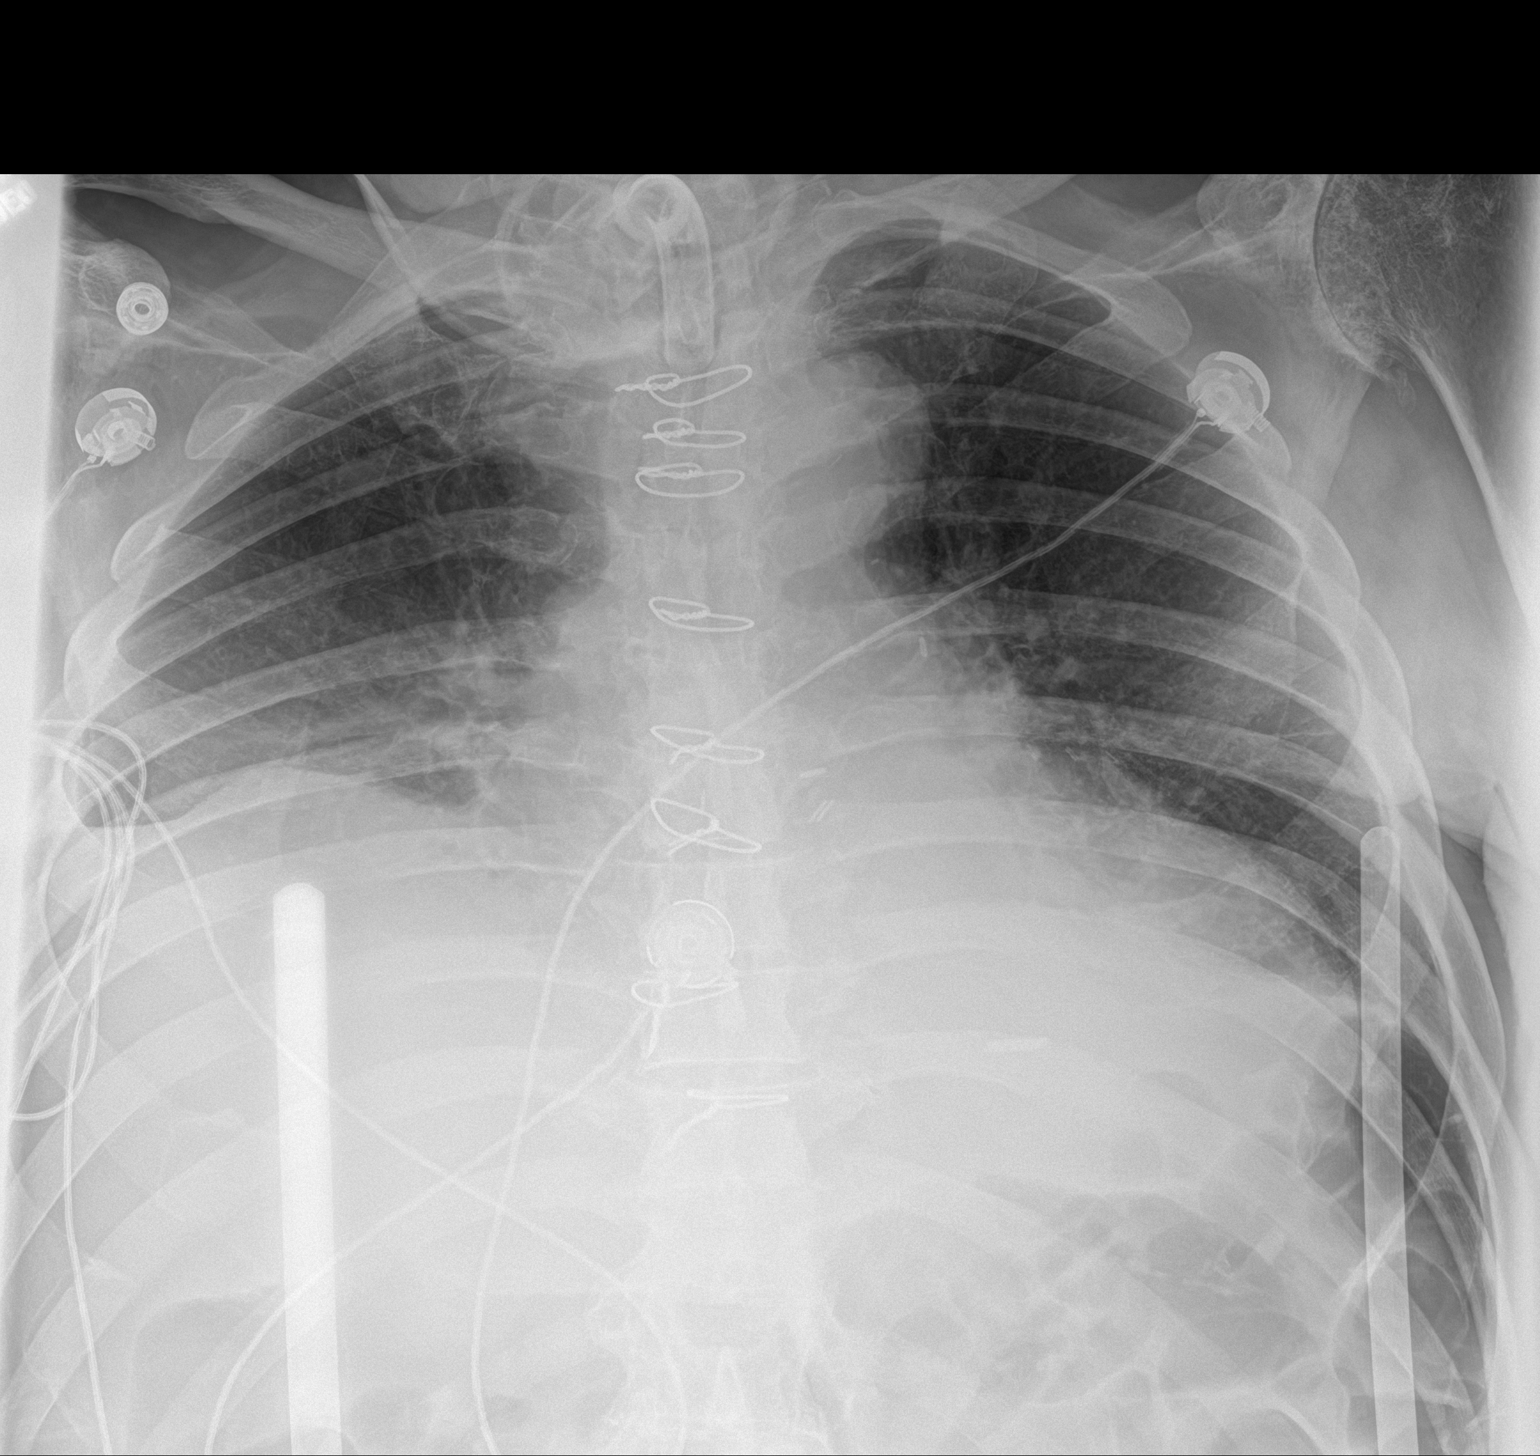

[1 of 1 positions shown; findings below may reference images not displayed]

FINDINGS: 9993 hours. Tracheostomy appears well positioned. The heart size and
mediastinal contours are stable post median sternotomy and CABG.
There are persistent low lung volumes with bibasilar atelectasis and
probable small bilateral pleural effusions. No edema, confluent
airspace opacity or pneumothorax. Glenohumeral degenerative changes
are present bilaterally.
IMPRESSION: Stable chest with persistent right-greater-than-left pleural
effusions and bibasilar atelectasis.

## 2021-03-14 IMAGING — DX DG WRIST 2V*L*
2 series · 2 of 2 positions shown · non-contrast
Comparison: None.

CLINICAL DATA: Wrist pain.

EXAM:
LEFT WRIST - 2 VIEW

[wrist ap]
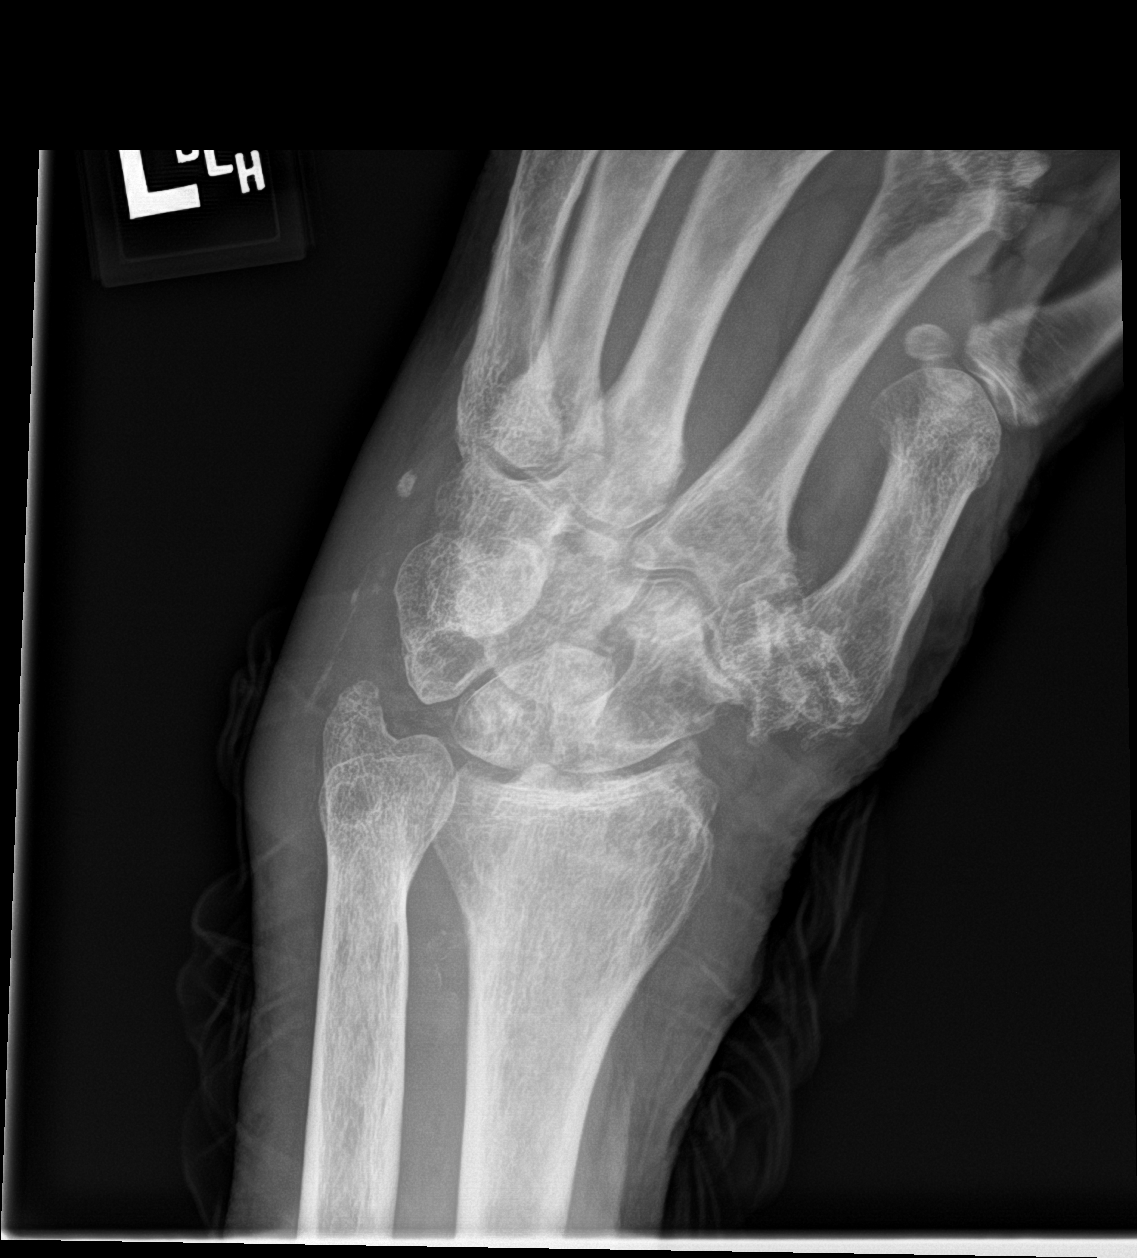

[wrist lat]
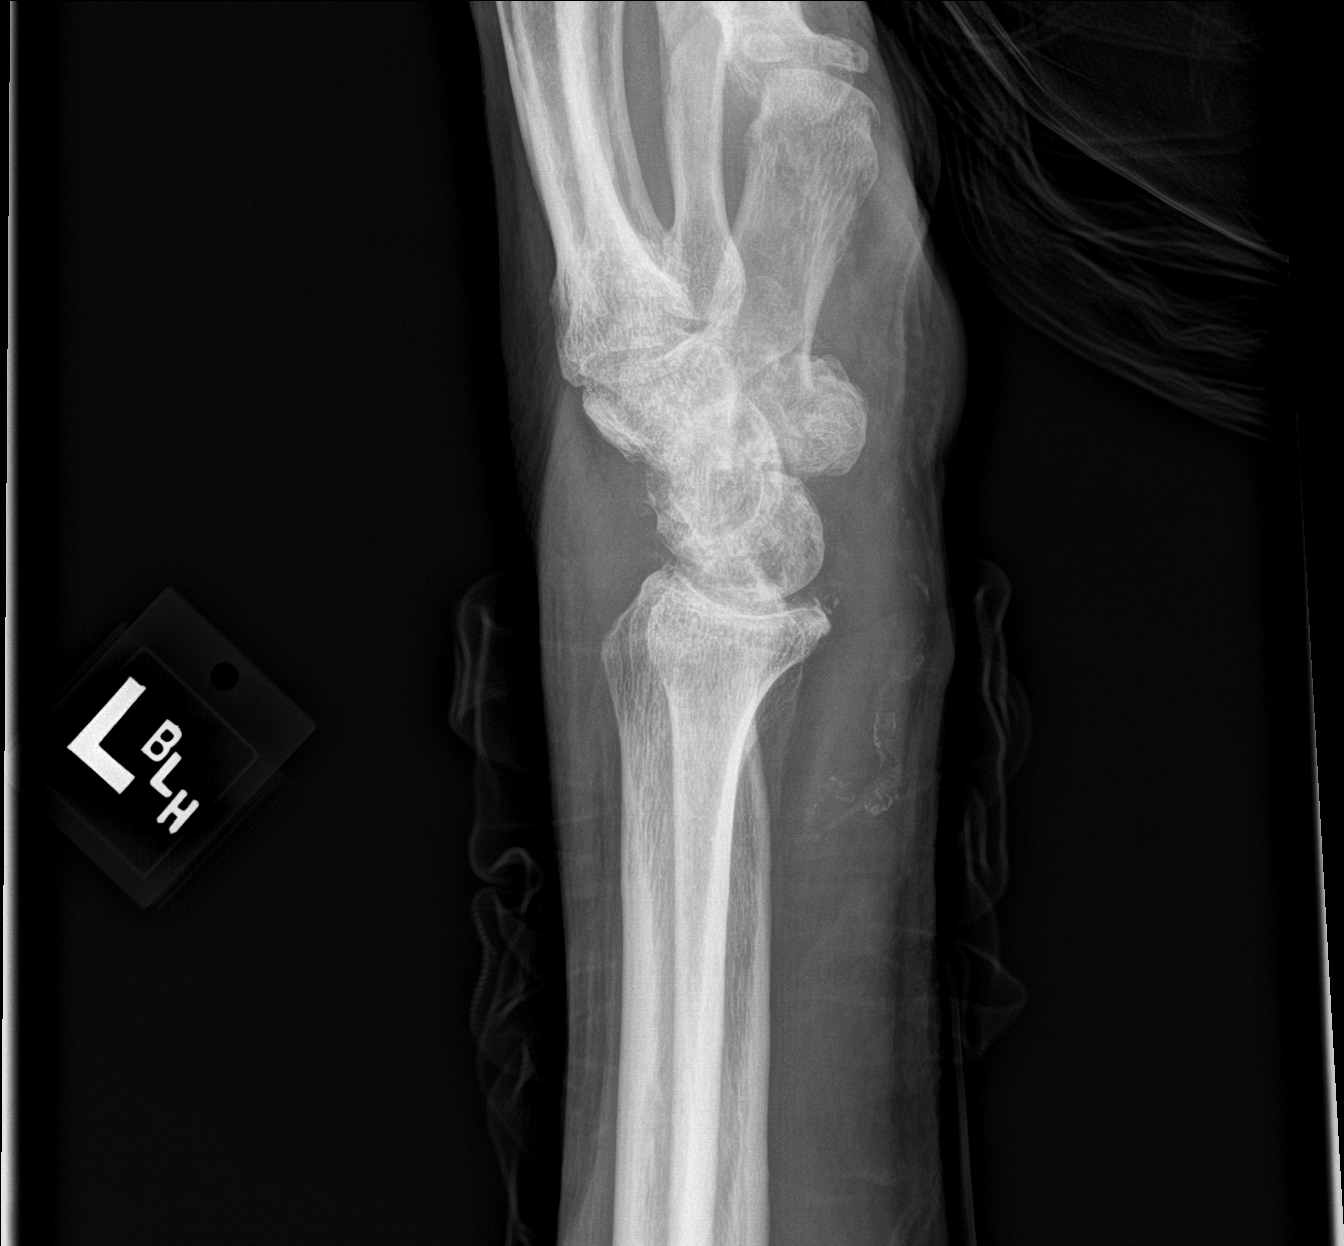

[2 of 2 positions shown; findings below may reference images not displayed]

FINDINGS: No acute fracture. No evidence of dislocation. There is ulna
positive variance. There is mild dorsal tilt of the lunate without
evidence of lunate or perilunate dislocation. Advanced
osteoarthritis at the thumb carpal metacarpal joint. Multifocal
cystic changes and lucencies throughout the carpal bones.
Chondrocalcinosis. There are vascular calcifications. Generalized
soft tissue edema.
IMPRESSION: 1. Advanced osteoarthritis at the thumb carpometacarpal joint.
2. Multifocal cystic changes in the since ease throughout the carpal
bones, typically degenerative but nonspecific. Generalized soft
tissue edema. Recommend clinical correlation for infection.
3. Ulnar positive variance.
4. Chondrocalcinosis.

## 2021-09-23 ENCOUNTER — Telehealth: Payer: Self-pay | Admitting: Neurology

## 2021-09-23 NOTE — Telephone Encounter (Signed)
Called patient and gave him the information from Dr. Tomi Likens

## 2021-09-23 NOTE — Telephone Encounter (Signed)
Patient called to see if he may be a good candidate for a study done in [place] for a test that can measure astrocytes related to cerebral amyloid.  If he is, he'd like more information.

## 2021-11-27 ENCOUNTER — Ambulatory Visit (INDEPENDENT_AMBULATORY_CARE_PROVIDER_SITE_OTHER): Payer: Medicare Other | Admitting: Internal Medicine

## 2021-11-27 ENCOUNTER — Other Ambulatory Visit: Payer: Self-pay | Admitting: Internal Medicine

## 2021-11-27 ENCOUNTER — Encounter (HOSPITAL_BASED_OUTPATIENT_CLINIC_OR_DEPARTMENT_OTHER): Payer: Self-pay | Admitting: Internal Medicine

## 2021-11-27 VITALS — BP 100/68 | HR 68 | Ht 67.0 in | Wt 166.1 lb

## 2021-11-27 DIAGNOSIS — Z951 Presence of aortocoronary bypass graft: Secondary | ICD-10-CM

## 2021-11-27 DIAGNOSIS — Z9889 Other specified postprocedural states: Secondary | ICD-10-CM

## 2021-11-27 DIAGNOSIS — I4819 Other persistent atrial fibrillation: Secondary | ICD-10-CM

## 2021-11-27 DIAGNOSIS — E854 Organ-limited amyloidosis: Secondary | ICD-10-CM

## 2021-11-27 DIAGNOSIS — I680 Cerebral amyloid angiopathy: Secondary | ICD-10-CM

## 2021-11-27 DIAGNOSIS — Z8679 Personal history of other diseases of the circulatory system: Secondary | ICD-10-CM

## 2021-11-27 NOTE — Patient Instructions (Signed)
Medication Instructions:  NO CHANGES  *If you need a refill on your cardiac medications before your next appointment, please call your pharmacy*  Follow-Up: At [facility], you and your health needs are our priority.  As part of our continuing mission to provide you with exceptional heart care, we have created designated Provider Care Teams.  These Care Teams include your primary Cardiologist (physician) and Advanced Practice Providers (APPs -  Physician Assistants and Nurse Practitioners) who all work together to provide you with the care you need, when you need it.  We recommend signing up for the patient portal called "MyChart".  Sign up information is provided on this After Visit Summary.  MyChart is used to connect with patients for Virtual Visits (Telemedicine).  Patients are able to view lab/test results, encounter notes, upcoming appointments, etc.  Non-urgent messages can be sent to your provider as well.   To learn more about what you can do with MyChart, go to [email or web address].    Your next appointment:   12 month(s)  The format for your next appointment:   In Person  Provider:   Pixie Casino, MD {

## 2021-11-27 NOTE — Progress Notes (Signed)
OFFICE NOTE  Chief Complaint:  Follow-up  Primary Care Physician: Willeen Niece, PA  HPI:  Travis Brewer is a 87 y.o. male with a past medial history significant for recent diagnosis of ascending aortic aneurysm status post urgent elective surgery for aneurysm repair and possible replacement of a bicuspid aortic valve.  He underwent surgery at [facility] on 02/22/2019, undergoing an aneurysm replacement, resuspension of the aortic valve and CABG x2 with LIMA to LAD, SVG to D1.  Surgery was complicated by cardiac arrest that occurred shortly after sternotomy thought to be related to air embolism.  Ultimately the patient was successfully placed on bypass, the groin was explored and thought to be the source of air introduction during vein harvest.  Surgery then proceeded as scheduled however postoperatively was complicated by atrial fibrillation, 2 strokes, failure to wean off of the ventilator and ultimate placement of a trach and PEG.  He was subsequently transferred to the [facility] in [place] and spent most of March 2021 there, ultimately eventually being weaned off of the ventilator and having his PEG tube reversed.  He presents today with his wife is also a new patient of mine to establish cardiology care.  He was seen in the hospital by Dr. Doylene Canard, one of the local cardiologist, who had added digoxin for aid in A. fib rate control as well as possible RV inotropic stimulation.  He noted that the patient's chads vas score was 5 however anticoagulation was not recommended.  In fact I cannot understand why the patient has not been anticoagulated as there is no history of significant contraindication to this, but he did have 2 strokes and has had what appears to be persistent atrial fibrillation since surgery.  There is no evidence of left atrial appendage closure in fact it seemed that the surgery went pretty emergently after his arrest.  Recently Travis Brewer has been  complaining of positional dizziness, especially when bending over and sitting up too quickly or turning over to one side.  Blood pressure today was noted to be low at 104/72.  EKG showed that he is in A. fib with controlled ventricular response at 70-personally reviewed.  Other than this he reports he is slowly getting back to somewhat normal activities.  He did undergo some speech therapy.  He had an echo that was performed prior to bypass which showed normal LVEF and a subsequent echo in January which showed preserved LV function.  He denies any MI during the hospitalization.  He has no chest pain or shortness of breath.  He reports fatigue but it is difficult to know whether this is related to his A. fib.  The last echo in January while showing normal LVEF did show moderate to severe biatrial enlargement, suggesting that his A. fib may be challenging to rhythm control.  12/25/2019  Travis Brewer returns today for follow-up.  He recently underwent uncomplicated mesh hernia repair by Dr. Georgette Dover which went very well.  Apparently was discharged same day.  He has done well recovering.  He is interested in getting reestablished with some physical therapy to improve his strength.  He did undergo a repeat EF 60 to 65% without regional wall motion abnormalities however there are signs of right ventricular volume overload with mildly elevated pulmonary artery systolic pressure and RVSP of 38 mmHg.  The left atrium is mild to moderately dilated and the right atrium is severely dilated.  There is mild to moderate MR and severe TR.  There is mild AI.  The aortic root is dilated at 42 mm without evidence of a sending aortic dilatation.  The ascending aorta as mentioned was grafted.  The IVC was dilated with elevated right atrial pressure 15 mmHg.  Despite these echo findings, Travis Brewer says he feels well.  He denies any shortness of breath with exertion, chest pain or associated symptoms.  He is not currently anticoagulated.  We  previously discussed going on Eliquis for anticoagulation however he is concerned about the risk for intracranial bleeding.  He remains on low-dose aspirin however this is not sufficient to protect him from stroke.  We discussed alternatives to anticoagulation including possible left atrial appendage occlusion with a watchman device, however he wished to consider that further and is not interested in anticoagulation at this time.  03/10/2020  Travis Brewer is seen today in follow-up.  Echo in September showed normal LVEF 60 to 65% with signs of RV volume overload.  There was only mild pulmonary hypertension with an RVSP of 38 mmHg.  There was mild to moderate left atrial enlargement and severe right atrial enlargement with mild to moderate MR and severe TR.  The aortic root was dilated at 4.2 cm.  He continues to have no complaints of worsening shortness of breath.  He was noted to have mildly elevated BNP and I had recommended increasing his Lasix up to 40 mg daily.  He seems to be tolerating this and had no significant change in his creatinine with the increase in the Lasix.  We again discussed the fact that he is not anticoagulated.  He is in persistent if not longstanding persistent atrial fibrillation.  He had concerns about anticoagulation and is not willing to take it due to his prior risk of intracranial bleeding.  We again discussed the watchman procedure and he is now interested in further evaluation.  Also, his wife who was at the visit today mentioned that he had been noticed to have witnessed episodes of apnea.  It also sounds like possibly a Cheyne Stokes pattern of respiration where she said after about every 8 or 9 breaths he had stopped breathing.  Reportedly he had a sleep study in [place] but never got the results of that.  11/25/2020  Travis Brewer returns today for follow-up.  He was referred for watchman left atrial appendage occlusion device.  Unfortunately after 2 attempts this could not be  successfully placed and the procedure was aborted.  He has seen neurology and because of his cerebral amyloid angiopathy, he is at high risk for bleeding on anticoagulation and therefore will remain on low-dose aspirin.  He is in likely permanent atrial fibrillation at this point.  He is asymptomatic with it and rate controlled.  He exercises regularly and says he is improving daily with regards to physical therapy.  PMHx:  Past Medical History:  Diagnosis Date   Acute on chronic respiratory failure with hypoxia (HCC)    BPH (benign prostatic hyperplasia)    CAD (coronary artery disease)    Cancer ([place])    Melonoma on left shoulder blade removed   Cerebral amyloid angiopathy (HCC)    Chronic atrial fibrillation (HCC)    Chronic congestive heart failure with left ventricular diastolic dysfunction (HCC)    Diaphragmatic paralysis    Dyspnea    Dysrhythmia    ED (erectile dysfunction)    Hepatitis    Hep A   History of DVT of lower extremity    BLE DVT ~  04/2019   Pneumonia    Stroke (cerebrum) ([place]) 03/2019   STROKE X 2    FAMHx:  Family History  Problem Relation Age of Onset   Diabetes Mother    Stroke Father     SOCHx:   reports that he has never smoked. He has never used smokeless tobacco. He reports that he does not use drugs. No history on file for alcohol use.  ALLERGIES:  Allergies  Allergen Reactions   Quetiapine Nausea And Vomiting    When taken with clonazepam (seprately they are fine)   Clonazepam Nausea And Vomiting    When taken with quetiapine (seprately they are fine)    ROS: Pertinent items noted in HPI and remainder of comprehensive ROS otherwise negative.  HOME MEDS: Current Outpatient Medications on File Prior to Visit  Medication Sig Dispense Refill   Ascorbic Acid (VITAMIN C) 500 MG CAPS Take 500 mg by mouth daily.      aspirin EC 81 MG tablet Take 81 mg by mouth daily. Swallow whole.     atorvastatin (LIPITOR) 80 MG tablet Take 1 tablet (80 mg  total) by mouth daily. 90 tablet 3   finasteride (PROSCAR) 5 MG tablet Take 5 mg by mouth daily.     Liniments (DEEP BLUE RELIEF) GEL Apply 1 application topically at bedtime.     loratadine (CLARITIN) 10 MG tablet Take 10 mg by mouth daily as needed for allergies.     melatonin 5 MG TABS Take 5 mg by mouth.     metoprolol tartrate (LOPRESSOR) 25 MG tablet Take 25 mg by mouth 2 (two) times daily.      Multiple Vitamins-Minerals (MULTIVITAMIN ADULT EXTRA C PO) Take 1 tablet by mouth daily.     Multiple Vitamins-Minerals (PRESERVISION AREDS 2 PO) Take 1 tablet by mouth 2 (two) times daily.     omeprazole (PRILOSEC) 20 MG capsule Take 20 mg by mouth daily.     tadalafil (CIALIS) 5 MG tablet Take 5 mg by mouth daily.     No current facility-administered medications on file prior to visit.    LABS/IMAGING: No results found for this or any previous visit (from the past 48 hour(s)). No results found.  LIPID PANEL: No results found for: "CHOL", "TRIG", "HDL", "CHOLHDL", "VLDL", "LDLCALC", "LDLDIRECT"   WEIGHTS: Wt Readings from Last 3 Encounters:  11/27/21 166 lb 1.6 oz (75.3 kg)  02/10/21 165 lb (74.8 kg)  11/25/20 169 lb (76.7 kg)    VITALS: BP 100/68   Pulse 68   Ht '5\' 7"'$  (1.702 m)   Wt 166 lb 1.6 oz (75.3 kg)   SpO2 92%   BMI 26.01 kg/m   EXAM: General appearance: alert, no distress and pale Neck: no carotid bruit, no JVD and thyroid not enlarged, symmetric, no tenderness/mass/nodules Lungs: clear to auscultation bilaterally Heart: irregularly irregular rhythm Abdomen: soft, non-tender; bowel sounds normal; no masses,  no organomegaly Extremities: extremities normal, atraumatic, no cyanosis or edema Pulses: 2+ and symmetric Skin: Skin color, texture, turgor normal. No rashes or lesions Neurologic: Grossly normal Psych: Pleasant  EKG: A. fib at 65-personally reviewed  ASSESSMENT: Status post ascending aneurysm replacement (02/2019-[facility]) CAD status  post CABG x2 (LIMA to LAD, SVG to D1)-11/20, [facility] hospital Intraprocedural cardiac arrest secondary to possible air embolus Prolonged ventilation necessitating trach and PEG[facility] Longstanding persistent atrial fibrillation postoperative with moderate to severe biatrial enlargement normal LV function by echo in January 2021, mild  to moderate RV hypokinesis, CHADSVASC Score of 5 -not anticoagulated due to concern for intracranial bleeding risk. Dyslipidemia BPH ED LVEF 60 to 65%, mild to moderate MR, severe TR (12/2019) Cerebral amyloid angiopathy  PLAN: 1.   Travis Brewer unfortunately could not have the watchman procedure due to placement issues.  After 2 attempts the procedure was aborted.  He will remain on low-dose aspirin because of the increased risk of bleeding due to cerebral amyloid angiopathy.  He remains in rate controlled A. fib.  Blood pressure is well controlled.  He exercises regularly without issues.  No changes to his medicines today.  Plan follow-up with me annually or sooner as necessary.  Pixie Casino, MD, [facility], [place] Director of the Advanced Lipid Disorders &  Cardiovascular Risk Reduction Clinic Diplomate of the American Board of Clinical Lipidology Attending Cardiologist  Direct Dial: [phone]  Fax: [phone]  Website:  [email or web address] Dartha Rozzell 11/27/2021, 3:14 PM

## 2021-12-01 ENCOUNTER — Telehealth: Payer: Self-pay | Admitting: Internal Medicine

## 2021-12-01 DIAGNOSIS — I680 Cerebral amyloid angiopathy: Secondary | ICD-10-CM

## 2021-12-01 DIAGNOSIS — E854 Organ-limited amyloidosis: Secondary | ICD-10-CM

## 2021-12-01 NOTE — Telephone Encounter (Signed)
Referral ordered MyChart message sent to patient with update

## 2021-12-01 NOTE — Telephone Encounter (Signed)
-----   Message from Pixie Casino, MD sent at 11/29/2021 10:13 PM EDT ----- Regarding: Neurology referral Mr. Buffin asked for a referral on Friday for a second opinion regarding his cerebral amyloid angiopathy and anticoagulation. Can you please refer to Dr. Jillyn Hidden at [facility].  Thanks. -Mali

## 2021-12-30 NOTE — Telephone Encounter (Signed)
Spoke with patient. He reviewed my noted from yesterday about his [place] referral. He called this practice and is scheduled with a Dr. Guerry Bruin in October 2023. He was appreciative of the follow up

## 2021-12-30 NOTE — Telephone Encounter (Signed)
Patient is requesting to speak directly with Eliezer Lofts, RN regarding referral. He declined discussing this with me.

## 2022-01-07 ENCOUNTER — Inpatient Hospital Stay (HOSPITAL_COMMUNITY): Payer: Medicare Other

## 2022-01-07 ENCOUNTER — Encounter (HOSPITAL_COMMUNITY): Payer: Self-pay | Admitting: Neurology

## 2022-01-07 ENCOUNTER — Other Ambulatory Visit: Payer: Self-pay

## 2022-01-07 ENCOUNTER — Emergency Department (HOSPITAL_COMMUNITY): Payer: Medicare Other

## 2022-01-07 ENCOUNTER — Inpatient Hospital Stay (HOSPITAL_COMMUNITY)
Admission: EM | Admit: 2022-01-07 | Discharge: 2022-01-08 | DRG: 062 | Disposition: A | Discharge status: Home / Self Care | Payer: Medicare Other | Attending: Neurology | Admitting: Neurology

## 2022-01-07 DIAGNOSIS — E669 Obesity, unspecified: Secondary | ICD-10-CM | POA: Diagnosis present

## 2022-01-07 DIAGNOSIS — G8324 Monoplegia of upper limb affecting left nondominant side: Secondary | ICD-10-CM | POA: Diagnosis present

## 2022-01-07 DIAGNOSIS — I5032 Chronic diastolic (congestive) heart failure: Secondary | ICD-10-CM | POA: Diagnosis present

## 2022-01-07 DIAGNOSIS — F03A0 Unspecified dementia, mild, without behavioral disturbance, psychotic disturbance, mood disturbance, and anxiety: Secondary | ICD-10-CM | POA: Diagnosis present

## 2022-01-07 DIAGNOSIS — Z823 Family history of stroke: Secondary | ICD-10-CM | POA: Diagnosis not present

## 2022-01-07 DIAGNOSIS — I6389 Other cerebral infarction: Secondary | ICD-10-CM

## 2022-01-07 DIAGNOSIS — Z86718 Personal history of other venous thrombosis and embolism: Secondary | ICD-10-CM | POA: Diagnosis not present

## 2022-01-07 DIAGNOSIS — Z888 Allergy status to other drugs, medicaments and biological substances status: Secondary | ICD-10-CM

## 2022-01-07 DIAGNOSIS — I2510 Atherosclerotic heart disease of native coronary artery without angina pectoris: Secondary | ICD-10-CM | POA: Diagnosis present

## 2022-01-07 DIAGNOSIS — N529 Male erectile dysfunction, unspecified: Secondary | ICD-10-CM | POA: Diagnosis present

## 2022-01-07 DIAGNOSIS — I6340 Cerebral infarction due to embolism of unspecified cerebral artery: Secondary | ICD-10-CM | POA: Diagnosis present

## 2022-01-07 DIAGNOSIS — R29810 Facial weakness: Secondary | ICD-10-CM | POA: Diagnosis present

## 2022-01-07 DIAGNOSIS — Z85820 Personal history of malignant melanoma of skin: Secondary | ICD-10-CM

## 2022-01-07 DIAGNOSIS — Z6826 Body mass index (BMI) 26.0-26.9, adult: Secondary | ICD-10-CM

## 2022-01-07 DIAGNOSIS — R29704 NIHSS score 4: Secondary | ICD-10-CM | POA: Diagnosis present

## 2022-01-07 DIAGNOSIS — E785 Hyperlipidemia, unspecified: Secondary | ICD-10-CM | POA: Diagnosis present

## 2022-01-07 DIAGNOSIS — R270 Ataxia, unspecified: Secondary | ICD-10-CM | POA: Diagnosis present

## 2022-01-07 DIAGNOSIS — E854 Organ-limited amyloidosis: Secondary | ICD-10-CM | POA: Diagnosis present

## 2022-01-07 DIAGNOSIS — I110 Hypertensive heart disease with heart failure: Secondary | ICD-10-CM | POA: Diagnosis present

## 2022-01-07 DIAGNOSIS — I63411 Cerebral infarction due to embolism of right middle cerebral artery: Secondary | ICD-10-CM

## 2022-01-07 DIAGNOSIS — I680 Cerebral amyloid angiopathy: Secondary | ICD-10-CM | POA: Diagnosis present

## 2022-01-07 DIAGNOSIS — N400 Enlarged prostate without lower urinary tract symptoms: Secondary | ICD-10-CM | POA: Diagnosis present

## 2022-01-07 DIAGNOSIS — Z79899 Other long term (current) drug therapy: Secondary | ICD-10-CM | POA: Diagnosis not present

## 2022-01-07 DIAGNOSIS — J986 Disorders of diaphragm: Secondary | ICD-10-CM | POA: Diagnosis present

## 2022-01-07 DIAGNOSIS — I482 Chronic atrial fibrillation, unspecified: Secondary | ICD-10-CM | POA: Diagnosis present

## 2022-01-07 DIAGNOSIS — Z8673 Personal history of transient ischemic attack (TIA), and cerebral infarction without residual deficits: Secondary | ICD-10-CM | POA: Diagnosis not present

## 2022-01-07 DIAGNOSIS — Z7982 Long term (current) use of aspirin: Secondary | ICD-10-CM | POA: Diagnosis not present

## 2022-01-07 DIAGNOSIS — I639 Cerebral infarction, unspecified: Secondary | ICD-10-CM | POA: Diagnosis present

## 2022-01-07 DIAGNOSIS — R471 Dysarthria and anarthria: Secondary | ICD-10-CM | POA: Diagnosis present

## 2022-01-07 LAB — DIFFERENTIAL
Abs Immature Granulocytes: 0.01 10*3/uL (ref 0.00–0.07)
Basophils Absolute: 0 10*3/uL (ref 0.0–0.1)
Basophils Relative: 1 %
Eosinophils Absolute: 0.3 10*3/uL (ref 0.0–0.5)
Eosinophils Relative: 5 %
Immature Granulocytes: 0 %
Lymphocytes Relative: 41 %
Lymphs Abs: 2.7 10*3/uL (ref 0.7–4.0)
Monocytes Absolute: 0.6 10*3/uL (ref 0.1–1.0)
Monocytes Relative: 9 %
Neutro Abs: 3 10*3/uL (ref 1.7–7.7)
Neutrophils Relative %: 44 %

## 2022-01-07 LAB — COMPREHENSIVE METABOLIC PANEL
ALT: 28 U/L (ref 0–44)
AST: 34 U/L (ref 15–41)
Albumin: 3.9 g/dL (ref 3.5–5.0)
Alkaline Phosphatase: 83 U/L (ref 38–126)
Anion gap: 10 (ref 5–15)
BUN: 20 mg/dL (ref 8–23)
CO2: 22 mmol/L (ref 22–32)
Calcium: 9.3 mg/dL (ref 8.9–10.3)
Chloride: 104 mmol/L (ref 98–111)
Creatinine, Ser: 0.96 mg/dL (ref 0.61–1.24)
GFR, Estimated: >60 mL/min (ref >60)
Glucose, Bld: 100 mg/dL — ABNORMAL HIGH (ref 70–99)
Potassium: 4.2 mmol/L (ref 3.5–5.1)
Sodium: 136 mmol/L (ref 135–145)
Total Bilirubin: 0.7 mg/dL (ref 0.3–1.2)
Total Protein: 6.3 g/dL — ABNORMAL LOW (ref 6.5–8.1)

## 2022-01-07 LAB — I-STAT CHEM 8, ED
BUN: 21 mg/dL (ref 8–23)
Calcium, Ion: 0.98 mmol/L — ABNORMAL LOW (ref 1.15–1.40)
Chloride: 105 mmol/L (ref 98–111)
Creatinine, Ser: 0.9 mg/dL (ref 0.61–1.24)
Glucose, Bld: 95 mg/dL (ref 70–99)
HCT: 42 % (ref 39.0–52.0)
Hemoglobin: 14.3 g/dL (ref 13.0–17.0)
Potassium: 4.1 mmol/L (ref 3.5–5.1)
Sodium: 137 mmol/L (ref 135–145)
TCO2: 21 mmol/L — ABNORMAL LOW (ref 22–32)

## 2022-01-07 LAB — CBC
HCT: 44.3 % (ref 39.0–52.0)
Hemoglobin: 14.7 g/dL (ref 13.0–17.0)
MCH: 34.2 pg — ABNORMAL HIGH (ref 26.0–34.0)
MCHC: 33.2 g/dL (ref 30.0–36.0)
MCV: 103 fL — ABNORMAL HIGH (ref 80.0–100.0)
Platelets: 147 10*3/uL — ABNORMAL LOW (ref 150–400)
RBC: 4.3 MIL/uL (ref 4.22–5.81)
RDW: 12.8 % (ref 11.5–15.5)
WBC: 6.6 10*3/uL (ref 4.0–10.5)
nRBC: 0 % (ref 0.0–0.2)

## 2022-01-07 LAB — CBG MONITORING, ED: Glucose-Capillary: 101 mg/dL — ABNORMAL HIGH (ref 70–99)

## 2022-01-07 LAB — LIPID PANEL
Cholesterol: 92 mg/dL (ref 0–200)
HDL: 58 mg/dL (ref >40)
LDL Cholesterol: 28 mg/dL (ref 0–99)
Total CHOL/HDL Ratio: 1.6 RATIO
Triglycerides: 32 mg/dL (ref <150)
VLDL: 6 mg/dL (ref 0–40)

## 2022-01-07 LAB — PROTIME-INR
INR: 1.1 (ref 0.8–1.2)
Prothrombin Time: 13.6 seconds (ref 11.4–15.2)

## 2022-01-07 LAB — ECHOCARDIOGRAM COMPLETE
AR max vel: 2.93 cm2
AV Area VTI: 2.61 cm2
AV Area mean vel: 2.89 cm2
AV Mean grad: 4 mmHg
AV Peak grad: 8.2 mmHg
Ao pk vel: 1.43 m/s
Height: 67 in
MV M vel: 3.9 m/s
MV Peak grad: 60.7 mmHg
P 1/2 time: 846 msec
S' Lateral: 2.6 cm
Weight: 2737.23 oz

## 2022-01-07 LAB — ETHANOL: Alcohol, Ethyl (B): <10 mg/dL (ref <10)

## 2022-01-07 LAB — HEMOGLOBIN A1C
Hgb A1c MFr Bld: 5.1 % (ref 4.8–5.6)
Mean Plasma Glucose: 99.67 mg/dL

## 2022-01-07 LAB — MRSA NEXT GEN BY PCR, NASAL: MRSA by PCR Next Gen: NOT DETECTED

## 2022-01-07 LAB — APTT: aPTT: 31 seconds (ref 24–36)

## 2022-01-07 MED ORDER — NICARDIPINE HCL IN NACL 20-0.86 MG/200ML-% IV SOLN: 0.0000 mg/h | INTRAVENOUS | Status: DC | PRN

## 2022-01-07 MED ORDER — SODIUM CHLORIDE 0.9% FLUSH
3.0000 mL | Freq: Once | INTRAVENOUS | Status: AC
  Administered 2022-01-07: 3 mL via INTRAVENOUS

## 2022-01-07 MED ORDER — IOHEXOL 350 MG/ML SOLN
75.0000 mL | Freq: Once | INTRAVENOUS | Status: AC | PRN
  Administered 2022-01-07: 75 mL via INTRAVENOUS

## 2022-01-07 MED ORDER — SODIUM CHLORIDE 0.9 % IV SOLN: INTRAVENOUS | Status: DC

## 2022-01-07 MED ORDER — ACETAMINOPHEN 325 MG PO TABS: 650.0000 mg | ORAL_TABLET | ORAL | Status: DC | PRN

## 2022-01-07 MED ORDER — ORAL CARE MOUTH RINSE: 15.0000 mL | OROMUCOSAL | Status: DC | PRN

## 2022-01-07 MED ORDER — PANTOPRAZOLE SODIUM 40 MG PO TBEC
40.0000 mg | DELAYED_RELEASE_TABLET | Freq: Every day | ORAL | Status: DC
  Administered 2022-01-07: 40 mg via ORAL
  Filled 2022-01-07: qty 1

## 2022-01-07 MED ORDER — TENECTEPLASE FOR STROKE
0.2500 mg/kg | PACK | Freq: Once | INTRAVENOUS | Status: AC
  Administered 2022-01-07: 19 mg via INTRAVENOUS
  Filled 2022-01-07: qty 10

## 2022-01-07 MED ORDER — STROKE: EARLY STAGES OF RECOVERY BOOK: Freq: Once | Status: DC

## 2022-01-07 MED ORDER — CHLORHEXIDINE GLUCONATE CLOTH 2 % EX PADS: 6.0000 | MEDICATED_PAD | Freq: Every day | CUTANEOUS | Status: DC

## 2022-01-07 MED ORDER — LABETALOL HCL 5 MG/ML IV SOLN: 10.0000 mg | Freq: Once | INTRAVENOUS | Status: DC | PRN

## 2022-01-07 MED ORDER — PANTOPRAZOLE SODIUM 40 MG IV SOLR: 40.0000 mg | Freq: Every day | INTRAVENOUS | Status: DC

## 2022-01-07 MED ORDER — ACETAMINOPHEN 650 MG RE SUPP: 650.0000 mg | RECTAL | Status: DC | PRN

## 2022-01-07 MED ORDER — ACETAMINOPHEN 160 MG/5ML PO SOLN: 650.0000 mg | ORAL | Status: DC | PRN

## 2022-01-07 NOTE — ED Triage Notes (Signed)
Pt BIB EMS code stroke with LKW of 2310, per EMS pt wife heard pt go to the BR at 0155 and noticed he had left sided weakness and facial droop; neurologist assesses pt upon arrival

## 2022-01-07 NOTE — H&P (Signed)
Neurology H&P  CC: Left arm weakness  History is obtained from: Patient  HPI: Travis Brewer is a 87 y.o. male with a history of stroke, atrial fibrillation who presents with left-sided weakness.  He went to bed about 11:10.  When he awoke he had significant facial droop, dysarthria and left arm weakness.  He was brought into the emergency department as a code stroke and on my evaluation, I felt that his left arm weakness was a disabling deficit.  I asked about contraindications including intracranial hemorrhage and the patient denied this.  I discussed risks benefits and alternatives of IV tenecteplase with the patient and he agreed to proceed.   LKW: 11:10 PM tpa given?:  Yes IR Thrombectomy? No, no LVO NIHSS: 4   Past Medical History:  Diagnosis Date   Acute on chronic respiratory failure with hypoxia (HCC)    BPH (benign prostatic hyperplasia)    CAD (coronary artery disease)    Cancer ([place])    Melonoma on left shoulder blade removed   Cerebral amyloid angiopathy (HCC)    Chronic atrial fibrillation (HCC)    Chronic congestive heart failure with left ventricular diastolic dysfunction (HCC)    Diaphragmatic paralysis    Dyspnea    Dysrhythmia    ED (erectile dysfunction)    Hepatitis    Hep A   History of DVT of lower extremity    BLE DVT ~ 04/2019   Pneumonia    Stroke (cerebrum) ([place]) 03/2019   STROKE X 2     Family History  Problem Relation Age of Onset   Diabetes Mother    Stroke Father      Social History:  reports that he has never smoked. He has never used smokeless tobacco. He reports that he does not use drugs. No history on file for alcohol use.   Prior to Admission medications   Medication Sig Start Date End Date Taking? Authorizing Provider  Ascorbic Acid (VITAMIN C) 500 MG CAPS Take 500 mg by mouth daily.     [provider]  aspirin EC 81 MG tablet Take 81 mg by mouth daily. Swallow whole.    [provider]  atorvastatin  (LIPITOR) 80 MG tablet Take 1 tablet (80 mg total) by mouth daily. 09/19/19   Hilty, Nadean Corwin, MD  finasteride (PROSCAR) 5 MG tablet Take 5 mg by mouth daily. 02/05/21   [provider]  furosemide (LASIX) 40 MG tablet TAKE 1 TABLET DAILY 11/27/21   Hilty, Nadean Corwin, MD  Liniments (DEEP BLUE RELIEF) GEL Apply 1 application topically at bedtime.    [provider]  loratadine (CLARITIN) 10 MG tablet Take 10 mg by mouth daily as needed for allergies.    [provider]  melatonin 5 MG TABS Take 5 mg by mouth.    [provider]  metoprolol tartrate (LOPRESSOR) 25 MG tablet Take 25 mg by mouth 2 (two) times daily.  06/03/19   [provider]  Multiple Vitamins-Minerals (MULTIVITAMIN ADULT EXTRA C PO) Take 1 tablet by mouth daily.    [provider]  Multiple Vitamins-Minerals (PRESERVISION AREDS 2 PO) Take 1 tablet by mouth 2 (two) times daily.    [provider]  omeprazole (PRILOSEC) 20 MG capsule Take 20 mg by mouth daily. 11/08/14   [provider]  tadalafil (CIALIS) 5 MG tablet Take 5 mg by mouth daily.    [provider]     Exam: Current vital signs: BP 139/71 (BP  Location: Left Arm)   Pulse 65   Resp 18   Ht '5\' 7"'$  (1.702 m)   Wt 75.8 kg   BMI 26.17 kg/m    Physical Exam  Constitutional: Appears well-developed and well-nourished.  Psych: Affect appropriate to situation Eyes: No scleral injection HENT: No OP obstrucion Head: Normocephalic.  Cardiovascular: Normal rate and regular rhythm.  Respiratory: Effort normal and breath sounds normal to anterior ascultation GI: Soft.  No distension. There is no tenderness.  Skin: WDI  Neuro: Mental Status: Patient is awake, alert, oriented to person, place, month, year, and situation. Patient is able to give a clear and coherent history. No signs of aphasia or neglect Cranial Nerves: II: Visual Fields are full. Pupils are equal, round, and reactive to  light.   III,IV, VI: EOMI without ptosis or diploplia.  V: Facial sensation is symmetric to temperature VII: Facial movement is symmetric.  VIII: hearing is intact to voice X: Uvula elevates symmetrically XI: Shoulder shrug is symmetric. XII: tongue is midline without atrophy or fasciculations.  Motor: Tone is normal. Bulk is normal. 5/5 strength was present in the right arm and no drift in the right leg.  In the left arm he is able to lift against gravity with drift, but he has significant fine motor movement impairment in the left hand, which does appear to be a disabling deficit. Sensory: Sensation is diminished in the left arm more than leg Cerebellar: He has ataxia out of proportion to weakness in the left arm  I have reviewed labs in epic and the pertinent results are: Calcium 0.98  I have reviewed the images obtained: CT/CTA-negative  Primary Diagnosis:  Cerebral infarction due to occlusion or stenosis of unspecified cerebral artery.   Secondary Diagnosis: Chronic atrial fibrillation   Impression: 87 year old male with a history of atrial fibrillation who presented with acute left-arm weakness and left facial weakness status post tenecteplase.  He is not on anticoagulation due to history of possible amyloid angiopathy.  My suspicion is that this does represent a small embolus, and has rapidly improved following tenecteplase administration.  Plan: - HgbA1c, fasting lipid panel - MRI of the brain without contrast - Frequent neuro checks - Echocardiogram - Prophylactic therapy-Antiplatelet med: Aspirin -none for 24 hours - Risk factor modification - Telemetry monitoring - PT consult, OT consult, Speech consult - Stroke team to follow    This patient is critically ill and at significant risk of neurological worsening, death and care requires constant monitoring of vital signs, hemodynamics,respiratory and cardiac monitoring, neurological assessment, discussion with  family, other specialists and medical decision making of high complexity. I spent 65 minutes of neurocritical care time  in the care of  this patient. This was time spent independent of any time provided by nurse practitioner or PA.  Roland Rack, MD [facility] [phone]  If 7pm- 7am, please page neurology on call as listed in [place].

## 2022-01-07 NOTE — Evaluation (Signed)
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)    Past Medical History:  Past Medical History:  Diagnosis Date   Acute on chronic respiratory failure with hypoxia (HCC)    BPH (benign prostatic hyperplasia)    CAD (coronary artery disease)    Cancer ([place])    Melonoma on left shoulder blade removed   Cerebral amyloid angiopathy (HCC)    Chronic atrial fibrillation (HCC)    Chronic congestive heart failure with left ventricular diastolic dysfunction (HCC)    Diaphragmatic paralysis    Dyspnea    Dysrhythmia    ED (erectile dysfunction)    Hepatitis    Hep A   History of DVT of lower extremity    BLE DVT ~ 04/2019   Pneumonia    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   STROKE X 2   Past Surgical History:  Past Surgical History:  Procedure Laterality Date   ASCENDING AORTIC ANEURYSM REPAIR  02/22/2019   32 mm Hemashild graft, STJ to ascending interposition graft, resuspension of AV ([place])   BACK SURGERY     CARDIAC CATHETERIZATION  02/15/2019   [facility]   CORONARY ARTERY BYPASS GRAFT  02/22/2019   LIMA to LAD, SVG to D1 [facility])   EYE SURGERY Bilateral    Cataract surgery   INGUINAL HERNIA REPAIR Right 11/14/2019   Procedure: RIGHT INGUINAL HERNIA REPAIR WITH MESH;  Surgeon: Donnie Mesa, MD;  Location: [facility];   Service: General;  Laterality: Right;  LMA AND TAP BLOCK   JOINT REPLACEMENT Bilateral    knee replacement   L3-L4 Back surgery     LEFT ATRIAL APPENDAGE OCCLUSION N/A 05/29/2020   Procedure: LEFT ATRIAL APPENDAGE OCCLUSION;  Surgeon: Sherren Mocha, MD;  Location: [place] CV LAB;  Service: Cardiovascular;  Laterality: N/A;   PEG TUBE PLACEMENT     sciatic nerve cyst removal     TEE WITHOUT CARDIOVERSION N/A 05/29/2020   Procedure: TRANSESOPHAGEAL ECHOCARDIOGRAM (TEE);  Surgeon: Sherren Mocha, MD;  Location: [place] CV LAB;  Service: Cardiovascular;  Laterality: N/A;   TONSILLECTOMY     TRACHEOSTOMY     HPI:  Pt is an 87 y.o. male who presented with left arm weakness, facial droop, and dysarthria. TNK given. CT head negative; MRI brain pending at time of the evaluation. PMH: CAD s/p CABG, melonoma, chronic afib, CHF, DVT, PNA, and CVA x2, macular degeneration, hearing impairment.   Assessment / Plan / Recommendation Clinical Impression  Pt participated in speech-language-cognition evaluation. 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 He reported some age-related changes in memory, but otherwise denied any baseline or acute deficits in language or cognition. Pt stated that his speech has returned to baseline.  [facility] Mental Status Examination was completed to evaluate the pt's cognitive-linguistic skills. He achieved a score of 28/30 which  is within the normal limits of 27 or more out of 30. No speech or language deficits were noted and his performance on informal cognitive-linguistic tasks was within functional limits. Further skilled SLP services are not clinically indicated at this time. Pt and RN were educated regarding this and both verbalized understanding as well as agreement with plan of care.    SLP Assessment  SLP Recommendation/Assessment: Patient does not need any further Speech [place] [facility] SLP  Visit Diagnosis: Cognitive communication deficit (R41.841)    Recommendations for follow up therapy are one component of a multi-disciplinary discharge planning process, led by the attending physician.  Recommendations may be updated based on patient status, additional functional criteria and insurance authorization.    Follow Up Recommendations  No SLP follow up    Assistance Recommended at Discharge     Functional Status Assessment    Frequency and Duration           SLP Evaluation Cognition  Overall Cognitive Status: Within Functional Limits for tasks assessed Arousal/Alertness: Awake/alert Orientation Level: Oriented X4 Year: 2023 Month: September Day of Week: Correct Attention: Focused;Sustained;Selective Focused Attention: Appears intact Sustained Attention: Appears intact Selective Attention: Appears intact Memory: Appears intact (Immediate: 5/5; delayed: 5/5; paragraph: 8/8) Awareness: Appears intact Problem Solving: Appears intact (Money & time: 3/3) Executive Function: Sequencing;Organizing Sequencing: Appears intact (clock: 4/4 verbal WNL) Organizing: Appears intact (backward digit span up to 4 digits: 3/3)       Comprehension  Auditory Comprehension Overall Auditory Comprehension: Appears within functional limits for tasks assessed Yes/No Questions: Within Functional Limits Commands: Within Functional Limits Conversation: Complex    Expression Expression Primary Mode of Expression: Verbal Verbal Expression Overall Verbal Expression: Appears within functional limits for tasks assessed Initiation: No impairment Level of Generative/Spontaneous Verbalization: Conversation Repetition: No impairment Naming: No impairment Pragmatics: No impairment Written Expression Dominant Hand: Right   Oral / Motor  Oral Motor/Sensory Function Overall Oral Motor/Sensory Function: Within functional limits Motor Speech Overall Motor Speech: Appears within functional  limits for tasks assessed Respiration: Within functional limits Phonation: Normal Resonance: Within functional limits Articulation: Within functional limitis Intelligibility: Intelligible Motor Planning: Witnin functional limits Motor Speech Errors: Not applicable           Remi Lopata I. Hardin Negus, [place], [place] Office number [phone]  Horton Marshall 01/07/2022, 1:15 PM

## 2022-01-07 NOTE — Progress Notes (Signed)
PHARMACIST CODE STROKE RESPONSE  Notified to mix TNK at 2:46 by Dr. Leonel Ramsay TNK preparation completed at 2:50  TNK dose = 19 mg IV over 5 seconds  Issues/delays encountered (if applicable): n/a  Jodean Lima Chris Narasimhan 01/07/22 2:52 AM

## 2022-01-07 NOTE — Code Documentation (Signed)
Responded to Code Stroke called at 0226 for L sided facial droop and L arm weakness, [id number]. Pt arrived at 0238, CBG-101, NIH-4, CT head negative for acute changes. TNK given at 0251. CTA-no LVO. Plan VS/mNIH + deficit q103mx 2h, q367m 6h, q1h x 16h, and admission to ICU.

## 2022-01-07 NOTE — ED Provider Notes (Signed)
[facility] EMERGENCY DEPARTMENT Provider Note   CSN: [id number] Arrival date & time: 01/07/22  7106  An emergency department physician performed an initial assessment on this suspected stroke patient at 0239.  History  Chief Complaint  Patient presents with   Code Stroke   Level 5 caveat due to acuity of condition Travis Brewer is a 87 y.o. male.  The history is provided by the patient.  Patient presented as a code stroke.  Last known well at 2310 on September 27.  Patient woke up just before 2 AM and noted left arm weakness and facial droop. Patient denies any chest pain or shortness of breath.  No headache.  He has known history of A-fib but is not able to take anticoagulation    Past Medical History:  Diagnosis Date   Acute on chronic respiratory failure with hypoxia (HCC)    BPH (benign prostatic hyperplasia)    CAD (coronary artery disease)    Cancer ([place])    Melonoma on left shoulder blade removed   Cerebral amyloid angiopathy (HCC)    Chronic atrial fibrillation (HCC)    Chronic congestive heart failure with left ventricular diastolic dysfunction (HCC)    Diaphragmatic paralysis    Dyspnea    Dysrhythmia    ED (erectile dysfunction)    Hepatitis    Hep A   History of DVT of lower extremity    BLE DVT ~ 04/2019   Pneumonia    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 Authorizing Provider  Ascorbic Acid (VITAMIN C) 500 MG CAPS Take 500 mg by mouth daily.    Yes [provider]  aspirin EC 81 MG tablet Take 81 mg by mouth daily. Swallow whole.   Yes [provider]  atorvastatin (LIPITOR) 80 MG tablet Take 1 tablet (80 mg total) by mouth daily. 09/19/19  Yes Hilty, Nadean Corwin, MD  finasteride (PROSCAR) 5 MG tablet Take 5 mg by mouth daily. 02/05/21  Yes [provider]  furosemide (LASIX) 40 MG tablet TAKE 1 TABLET  DAILY Patient taking differently: Take 40 mg by mouth daily. 11/27/21  Yes Hilty, Nadean Corwin, MD  loratadine (CLARITIN) 10 MG tablet Take 10 mg by mouth daily as needed for allergies.   Yes [provider]  melatonin 5 MG TABS Take 5 mg by mouth.   Yes [provider]  metoprolol tartrate (LOPRESSOR) 25 MG tablet Take 25 mg by mouth 2 (two) times daily.  06/03/19  Yes [provider]  Multiple Vitamins-Minerals (MULTIVITAMIN ADULT EXTRA C PO) Take 1 tablet by mouth daily.   Yes [provider]  Multiple Vitamins-Minerals (PRESERVISION AREDS 2 PO) Take 1 tablet by mouth 2 (two) times daily.   Yes [provider]  tadalafil (CIALIS) 5 MG tablet Take 5 mg by mouth daily.   Yes [provider]      Allergies    Quetiapine and Clonazepam    Review of Systems   Review of Systems  Unable to perform ROS: Acuity of condition    Physical Exam Updated Vital Signs BP 112/69 (BP Location: Right Arm)   Pulse (!) 59   Temp 98.1 F (36.7 C) (Oral)   Resp 17   Ht 1.702 m ('5\' 7"'$ )   Wt 75.8 kg   SpO2 96%   BMI 26.17 kg/m  Physical Exam CONSTITUTIONAL: Elderly,  no acute distress HEAD: Normocephalic/atraumatic EYES: EOMI/PERRL ENMT: Mucous membranes moist NECK: supple no meningeal signs SPINE/BACK:entire spine nontender CV: Irregular, no loud murmurs LUNGS: Lungs are clear to auscultation bilaterally, no apparent distress ABDOMEN: soft, nontender NEURO: Pt is awake and alert.  No obvious facial droop.  No arm drift but does have decreased grip in left hand. Decreased sensation left arm. See neurology note for full NIH stroke scale EXTREMITIES: pulses normal/equal, full ROM SKIN: warm, color normal PSYCH: Unable to assess  ED Results / Procedures / Treatments   Labs (all labs ordered are listed, but only abnormal results are displayed) Labs Reviewed  CBC - Abnormal; Notable for the following components:      Result Value   MCV 103.0  (*)    MCH 34.2 (*)    Platelets 147 (*)    All other components within normal limits  COMPREHENSIVE METABOLIC PANEL - Abnormal; Notable for the following components:   Glucose, Bld 100 (*)    Total Protein 6.3 (*)    All other components within normal limits  I-STAT CHEM 8, ED - Abnormal; Notable for the following components:   Calcium, Ion 0.98 (*)    TCO2 21 (*)    All other components within normal limits  CBG MONITORING, ED - Abnormal; Notable for the following components:   Glucose-Capillary 101 (*)    All other components within normal limits  MRSA NEXT GEN BY PCR, NASAL  PROTIME-INR  APTT  DIFFERENTIAL  ETHANOL  LIPID PANEL  HEMOGLOBIN A1C    EKG ED ECG REPORT   Date: 09/28/20230314  Rate: 65  Rhythm: atrial fibrillation  QRS Axis: right  Intervals: normal  ST/T Wave abnormalities: nonspecific ST changes  Conduction Disutrbances:none   I have personally reviewed the EKG tracing and agree with the computerized printout as noted.  Radiology CT ANGIO HEAD NECK W WO CM (CODE STROKE)  Result Date: 01/07/2022 CLINICAL DATA:  Stroke suspected EXAM: CT ANGIOGRAPHY HEAD AND NECK TECHNIQUE: Multidetector CT imaging of the head and neck was performed using the standard protocol during bolus administration of intravenous contrast. Multiplanar CT image reconstructions and MIPs were obtained to evaluate the vascular anatomy. Carotid stenosis measurements (when applicable) are obtained utilizing NASCET criteria, using the distal internal carotid diameter as the denominator. RADIATION DOSE REDUCTION: This exam was performed according to the departmental dose-optimization program which includes automated exposure control, adjustment of the mA and/or kV according to patient size and/or use of iterative reconstruction technique. CONTRAST:  75m OMNIPAQUE IOHEXOL 350 MG/ML SOLN COMPARISON:  01/07/2022 CT head, no prior CTA FINDINGS: CT HEAD FINDINGS For noncontrast findings, please see  same day CT head. CTA NECK FINDINGS Aortic arch: Standard branching. Imaged portion shows no evidence of dissection. Ascending aortic diameter is likely at the upper limit of normal in size. Aortic atherosclerosis. No significant stenosis of the major arch vessel origins. Right carotid system: No evidence of dissection, occlusion, or hemodynamically significant stenosis (greater than 50%). Atherosclerotic disease at the bifurcation and in the proximal ICA is not hemodynamically significant. Left carotid system: No evidence of dissection, occlusion, or hemodynamically significant stenosis (greater than 50%). Atherosclerotic disease at the bifurcation and in the proximal ICA is not hemodynamically significant. Vertebral arteries: No evidence of dissection, occlusion, or hemodynamically significant stenosis (greater than 50%). Skeleton: Poor dentition with periapical lucency about the right maxillary first molar roots, with upward bowing and possible dehiscence of the floor of the maxillary sinus (series 9, image 77). No acute  fracture or suspicious osseous lesion. Other neck: No acute finding. Upper chest: No focal pulmonary opacity or pleural effusion. Emphysema. Review of the MIP images confirms the above findings CTA HEAD FINDINGS Anterior circulation: Both internal carotid arteries are patent to the termini, without significant stenosis. A1 segments patent. Normal anterior communicating artery. Anterior cerebral arteries are patent to their distal aspects. No M1 stenosis or occlusion. MCA branches perfused and symmetric. Posterior circulation: Vertebral arteries patent to the vertebrobasilar junction without stenosis. Posterior inferior cerebellar arteries patent proximally. Basilar patent to its distal aspect. Mild prominence of the basilar tip, favored to be secondary to the branching pattern rather than truly aneurysmal. Superior cerebellar arteries patent proximally. Patent P1 segments, with mild narrowing in  the right P1. PCAs otherwise perfused to their distal aspects without stenosis. The bilateral posterior communicating arteries are not visualized. Venous sinuses: As permitted by contrast timing, patent. Anatomic variants: None significant. Review of the MIP images confirms the above findings IMPRESSION: 1. No intracranial large vessel occlusion or significant stenosis. 2. No hemodynamically significant stenosis in the neck. 3. Poor dentition with periapical lucency about the right maxillary first molar roots, with upward bowing and possible dehiscence of the floor of the maxillary sinus, which, in conjunction with significant mucosal thickening in the right maxillary sinus, is concerning for odontogenic sinusitis. Correlate with dental exam. Electronically Signed   By: Merilyn Baba M.D.   On: 01/07/2022 03:11   CT HEAD CODE STROKE WO CONTRAST  Result Date: 01/07/2022 CLINICAL DATA:  Code stroke. EXAM: CT HEAD WITHOUT CONTRAST TECHNIQUE: Contiguous axial images were obtained from the base of the skull through the vertex without intravenous contrast. RADIATION DOSE REDUCTION: This exam was performed according to the departmental dose-optimization program which includes automated exposure control, adjustment of the mA and/or kV according to patient size and/or use of iterative reconstruction technique. COMPARISON:  None Available. FINDINGS: Brain: No evidence of acute infarction, hemorrhage, cerebral edema, mass, mass effect, or midline shift. No hydrocephalus or extra-axial collection. Periventricular white matter changes, likely the sequela of chronic small vessel ischemic disease. Remote right cerebellar infarct. Vascular: No hyperdense vessel. Skull: Negative for fracture or focal lesion. Sinuses/Orbits: Mucosal thickening in the right maxillary sinus, anterior ethmoid air cells, and bilateral frontal sinuses. Other: The mastoid air cells are well aerated. ASPECTS [facility] Stroke Program Early CT Score) -  Ganglionic level infarction (caudate, lentiform nuclei, internal capsule, insula, M1-M3 cortex): 7 - Supraganglionic infarction (M4-M6 cortex): 3 Total score (0-10 with 10 being normal): 10 IMPRESSION: 1. No acute intracranial process. 2. ASPECTS is 10 Code stroke imaging results were communicated on 01/07/2022 at 2:51 am to provider Dr. Leonel Ramsay via secure text paging. Electronically Signed   By: Merilyn Baba M.D.   On: 01/07/2022 02:52    Procedures .Critical Care  Performed by: Ripley Fraise, MD Authorized by: Ripley Fraise, MD   Critical care provider statement:    Critical care time (minutes):  31   Critical care start time:  01/07/2022 3:00 AM   Critical care end time:  01/07/2022 3:31 AM   Critical care was necessary to treat or prevent imminent or life-threatening deterioration of the following conditions:  CNS failure or compromise   Critical care was time spent personally by me on the following activities:  Discussions with consultants, ordering and review of laboratory studies, ordering and review of radiographic studies, pulse oximetry, evaluation of patient's response to treatment and examination of patient   I assumed direction of critical care  for this patient from another provider in my specialty: no     Care discussed with: admitting provider       Medications Ordered in ED Medications   stroke: early stages of recovery book (has no administration in time range)  0.9 %  sodium chloride infusion (has no administration in time range)  acetaminophen (TYLENOL) tablet 650 mg (has no administration in time range)    Or  acetaminophen (TYLENOL) 160 MG/5ML solution 650 mg (has no administration in time range)    Or  acetaminophen (TYLENOL) suppository 650 mg (has no administration in time range)  labetalol (NORMODYNE) injection 10 mg (has no administration in time range)    And  nicardipine (CARDENE) '20mg'$  in 0.86% saline 242m IV infusion (0.1 mg/ml) (has no  administration in time range)  pantoprazole (PROTONIX) injection 40 mg (has no administration in time range)  sodium chloride flush (NS) 0.9 % injection 3 mL (3 mLs Intravenous Given 01/07/22 0251)  tenecteplase (TNKASE) injection for Stroke 19 mg (19 mg Intravenous Given 01/07/22 0251)  iohexol (OMNIPAQUE) 350 MG/ML injection 75 mL (75 mLs Intravenous Contrast Given 01/07/22 0257)    ED Course/ Medical Decision Making/ A&P                           Medical Decision Making Amount and/or Complexity of Data Reviewed Labs: ordered. Radiology: ordered.  Risk Decision regarding hospitalization.   This patient presents to the ED for concern of weakness, this involves an extensive number of treatment options, and is a complaint that carries with it a high risk of complications and morbidity.  The differential diagnosis includes but is not limited to CVA, intracranial hemorrhage, electrolyte disturbance, acute coronary syndrome  Comorbidities that complicate the patient evaluation: Patient's presentation is complicated by their history of atrial fibrillation    Additional history obtained: Additional history obtained from spouse  Lab Tests: I Ordered, and personally interpreted labs.  The pertinent results include: Labs unremarkable  Imaging Studies ordered: I ordered imaging studies including CT scan head   I independently visualized and interpreted imaging which showed no acute findings I agree with the radiologist interpretation  Cardiac Monitoring: The patient was maintained on a cardiac monitor.  I personally viewed and interpreted the cardiac monitor which showed an underlying rhythm of:  Atrial Fibrillation  Medicines ordered and prescription drug management: Patient was given TNK by neurology for acute stroke    Critical Interventions:  TNK and admission to ICU  Consultations Obtained: I requested consultation with the admitting physician Dr. KLeonel Ramsay, and discussed   findings as well as pertinent plan - they recommend: Admit to ICU for acute stroke  Reevaluation: After the interventions noted above, I reevaluated the patient and found that they have :improved  Complexity of problems addressed: Patient's presentation is most consistent with  acute presentation with potential threat to life or bodily function  Disposition: After consideration of the diagnostic results and the patient's response to treatment,  I feel that the patent would benefit from admission   .           Final Clinical Impression(s) / ED Diagnoses Final diagnoses:  Acute ischemic stroke ([facility]    Rx / DC Orders ED Discharge Orders     None         WRipley Fraise MD 01/07/22 0403

## 2022-01-07 NOTE — Progress Notes (Addendum)
STROKE TEAM PROGRESS NOTE   INTERVAL HISTORY Seen in room, neurologically improved. Will need MRI to determine need for anticoagulation. States he's been improving since he received TNK Bedrest discontinued, general diet ordered. Hemodynamically stable.  CT scan of the head and CT angiogram showed no significant abnormalities.  MRI is pending.  Patient had a history of atrial fibrillation but has not been started on anticoagulation in the past due to concerns of cerebral amyloid angiopathy by his local neurologist as he has mild dementia and MRI had shown some microhemorrhages.  Patient's cardiologist however was not satisfied with this and had referred the patient for a second neurological opinion at [place] and appointment is out until November. Vitals:   01/07/22 0545 01/07/22 0600 01/07/22 0700 01/07/22 0800  BP: 114/79 113/77 120/82 (!) 125/58  Pulse: 63 76 (!) 56 61  Resp: '15 17 19 19  '$ Temp:    97.7 F (36.5 C)  TempSrc:    Oral  SpO2: 95% 93% 96% 95%  Weight:      Height:       CBC:  Recent Labs  Lab 01/07/22 0241 01/07/22 0248  WBC 6.6  --   NEUTROABS 3.0  --   HGB 14.7 14.3  HCT 44.3 42.0  MCV 103.0*  --   PLT 147*  --    Basic Metabolic Panel:  Recent Labs  Lab 01/07/22 0241 01/07/22 0248  NA 136 137  K 4.2 4.1  CL 104 105  CO2 22  --   GLUCOSE 100* 95  BUN 20 21  CREATININE 0.96 0.90  CALCIUM 9.3  --    Lipid Panel: No results for input(s): "CHOL", "TRIG", "HDL", "CHOLHDL", "VLDL", "LDLCALC" in the last 168 hours. HgbA1c:  Recent Labs  Lab 01/07/22 0409  HGBA1C 5.1   Urine Drug Screen: No results for input(s): "LABOPIA", "COCAINSCRNUR", "LABBENZ", "AMPHETMU", "THCU", "LABBARB" in the last 168 hours.  Alcohol Level  Recent Labs  Lab 01/07/22 0241  ETH <10    IMAGING past 24 hours CT ANGIO HEAD NECK W WO CM (CODE STROKE)  Result Date: 01/07/2022 CLINICAL DATA:  Stroke suspected EXAM: CT ANGIOGRAPHY HEAD AND NECK TECHNIQUE:  Multidetector CT imaging of the head and neck was performed using the standard protocol during bolus administration of intravenous contrast. Multiplanar CT image reconstructions and MIPs were obtained to evaluate the vascular anatomy. Carotid stenosis measurements (when applicable) are obtained utilizing NASCET criteria, using the distal internal carotid diameter as the denominator. RADIATION DOSE REDUCTION: This exam was performed according to the departmental dose-optimization program which includes automated exposure control, adjustment of the mA and/or kV according to patient size and/or use of iterative reconstruction technique. CONTRAST:  57m OMNIPAQUE IOHEXOL 350 MG/ML SOLN COMPARISON:  01/07/2022 CT head, no prior CTA FINDINGS: CT HEAD FINDINGS For noncontrast findings, please see same day CT head. CTA NECK FINDINGS Aortic arch: Standard branching. Imaged portion shows no evidence of dissection. Ascending aortic diameter is likely at the upper limit of normal in size. Aortic atherosclerosis. No significant stenosis of the major arch vessel origins. Right carotid system: No evidence of dissection, occlusion, or hemodynamically significant stenosis (greater than 50%). Atherosclerotic disease at the bifurcation and in the proximal ICA is not hemodynamically significant. Left carotid system: No evidence of dissection, occlusion, or hemodynamically significant stenosis (greater than 50%). Atherosclerotic disease at the bifurcation and in the proximal ICA is not hemodynamically significant. Vertebral arteries: No evidence of dissection, occlusion, or hemodynamically significant stenosis (greater than  50%). Skeleton: Poor dentition with periapical lucency about the right maxillary first molar roots, with upward bowing and possible dehiscence of the floor of the maxillary sinus (series 9, image 77). No acute fracture or suspicious osseous lesion. Other neck: No acute finding. Upper chest: No focal pulmonary opacity  or pleural effusion. Emphysema. Review of the MIP images confirms the above findings CTA HEAD FINDINGS Anterior circulation: Both internal carotid arteries are patent to the termini, without significant stenosis. A1 segments patent. Normal anterior communicating artery. Anterior cerebral arteries are patent to their distal aspects. No M1 stenosis or occlusion. MCA branches perfused and symmetric. Posterior circulation: Vertebral arteries patent to the vertebrobasilar junction without stenosis. Posterior inferior cerebellar arteries patent proximally. Basilar patent to its distal aspect. Mild prominence of the basilar tip, favored to be secondary to the branching pattern rather than truly aneurysmal. Superior cerebellar arteries patent proximally. Patent P1 segments, with mild narrowing in the right P1. PCAs otherwise perfused to their distal aspects without stenosis. The bilateral posterior communicating arteries are not visualized. Venous sinuses: As permitted by contrast timing, patent. Anatomic variants: None significant. Review of the MIP images confirms the above findings IMPRESSION: 1. No intracranial large vessel occlusion or significant stenosis. 2. No hemodynamically significant stenosis in the neck. 3. Poor dentition with periapical lucency about the right maxillary first molar roots, with upward bowing and possible dehiscence of the floor of the maxillary sinus, which, in conjunction with significant mucosal thickening in the right maxillary sinus, is concerning for odontogenic sinusitis. Correlate with dental exam. Electronically Signed   By: Merilyn Baba M.D.   On: 01/07/2022 03:11   CT HEAD CODE STROKE WO CONTRAST  Result Date: 01/07/2022 CLINICAL DATA:  Code stroke. EXAM: CT HEAD WITHOUT CONTRAST TECHNIQUE: Contiguous axial images were obtained from the base of the skull through the vertex without intravenous contrast. RADIATION DOSE REDUCTION: This exam was performed according to the  departmental dose-optimization program which includes automated exposure control, adjustment of the mA and/or kV according to patient size and/or use of iterative reconstruction technique. COMPARISON:  None Available. FINDINGS: Brain: No evidence of acute infarction, hemorrhage, cerebral edema, mass, mass effect, or midline shift. No hydrocephalus or extra-axial collection. Periventricular white matter changes, likely the sequela of chronic small vessel ischemic disease. Remote right cerebellar infarct. Vascular: No hyperdense vessel. Skull: Negative for fracture or focal lesion. Sinuses/Orbits: Mucosal thickening in the right maxillary sinus, anterior ethmoid air cells, and bilateral frontal sinuses. Other: The mastoid air cells are well aerated. ASPECTS [facility] Stroke Program Early CT Score) - Ganglionic level infarction (caudate, lentiform nuclei, internal capsule, insula, M1-M3 cortex): 7 - Supraganglionic infarction (M4-M6 cortex): 3 Total score (0-10 with 10 being normal): 10 IMPRESSION: 1. No acute intracranial process. 2. ASPECTS is 10 Code stroke imaging results were communicated on 01/07/2022 at 2:51 am to provider Dr. Leonel Ramsay via secure text paging. Electronically Signed   By: Merilyn Baba M.D.   On: 01/07/2022 02:52    PHYSICAL EXAM Constitutional: Appears well-developed and well-nourished.  Pleasant elderly Caucasian male Cardiovascular: Normal rate and regular rhythm.  Respiratory: Effort normal and breath sounds normal to anterior ascultation    Neuro: Mental Status: Patient is awake, alert, oriented to person, place, month, year, and situation. Patient is able to give a clear and coherent history. - named presidents, added change, 10/14 animals  - Remembered 2/3 - clock drawing accurate 4/4 No signs of aphasia or neglect Able to name 10 animals walking on 4 legs Cranial  Nerves: II: Visual Fields are full. Pupils are equal, round, and reactive to light.   III,IV, VI: EOMI  without ptosis or diploplia.  V: Facial sensation is symmetric to temperature VII: Facial movement left asymmetry .  VIII: hearing is intact to voice X: Uvula elevates symmetrically XI: Shoulder shrug is symmetric. XII: tongue is midline without atrophy or fasciculations.  Motor: Tone is normal. Bulk is normal. 5/5 strength Subtle weakness in left lower extremity  Slight left hand weakness with grip Sensory: Sensation is diminished in the left arm more than leg Cerebellar: Slight orbiting, mild fine motor slowing on the left   ASSESSMENT/PLAN Travis Brewer is a 87 y.o. male with history of stroke, atrial fibrillation,  presenting with facial droop, dysarthria, and left arm weakness.   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 ASPECTS 10.    CTA head & neck No intracranial large vessel occlusion or significant stenosis. MRI  Pending 2D Echo Pending LDL No results found for requested labs within last 1095 days. HgbA1c 5.1 VTE prophylaxis - SCDs    Diet   Diet regular Room service appropriate? Yes; Fluid consistency: Thin   aspirin 81 mg daily prior to admission, now on No antithrombotic until 24 hours post TNK Therapy recommendations:  Outpatient PT Disposition:  Pending  Concern for cerebral amyloid angiopathy MRI pending  MRI 2020- There are numerous scattered foci of abnormal susceptibility seen within bilateral cerebellar hemispheres, lower brainstem, peripheral subcortical white matter of the frontoparietal and temporal regions as well as in the bilateral thalami compatible with  sequela of microhemorrhages.   Atrial fibrillation Failed watchman Medications: finasteride,   Hypertension Home meds:  Lopressor 25 Stable Permissive hypertension (OK if < 220/120) but gradually normalize in 5-7 days Long-term BP goal normotensive  Hyperlipidemia Home  meds:  Atorvastatin '80mg'$ , resumed in hospital LDL No results found for requested labs within last 1095 days., goal < 70 High intensity statin not indicated  Continue statin at discharge  Other Stroke Risk Factors Advanced Age >/= 45  Obesity, Body mass index is 26.79 kg/m., BMI >/= 30 associated with increased stroke risk, recommend weight loss, diet and exercise as appropriate  Hx stroke/TIA November 2020- left frontal infarct Congestive heart failure Home meds: Lasix  Other Active Problems Coagulapathy post TNK No antithrombotics for 24 hours   Hospital day # 0  Patient seen and examined by NP/APP with MD. MD to update note as needed.   Janine Ores, DNP, FNP-BC [facility] Pager: [phone]  STROKE MD NOTE :  I have personally obtained history,examined this patient, reviewed notes, independently viewed imaging studies, participated in medical decision making and plan of care.ROS completed by me personally and pertinent positives fully documented  I have made any additions or clarifications directly to the above note. Agree with note above.  Patient presented with left hemiparesis mostly in the upper extremity secondary to likely right hemispheric embolic stroke from A-fib not on anticoagulation.  He was treated with IV TNK as shown good recovery.  Continue close neurological observation and strict blood pressure control as per post TNK protocol.  Mobilize out of bed.  Therapy consults.  Check MRI scan of the brain later today.  Aggressive risk factor modification.  Patient has not felt to be a candidate for anticoagulation in the past due to concerns about cerebral amyloid angiopathy.  Clearly his  dementia appears quite mild.  He is also stated attempts at [facility] device insertion.  I have discussed treatment with the patient.  Considering Eliquis versus participation in the [place] AF study(Eliquis versus Milvexian factor XI inhibitor).  He has not scheduled  appointment for a second opinion [facility] with a vascular neurologist which ENT before he decides.  I would prefer starting him on open label Eliquis after the MRI unless his cerebral microhemorrhages have significantly progressed.This patient is critically ill and at significant risk of neurological worsening, death and care requires constant monitoring of vital signs, hemodynamics,respiratory and cardiac monitoring, extensive review of multiple databases, frequent neurological assessment, discussion with family, other specialists and medical decision making of high complexity.I have made any additions or clarifications directly to the above note.This critical care time does not reflect procedure time, or teaching time or supervisory time of PA/NP/Med Resident etc but could involve care discussion time.  I spent 30 minutes of neurocritical care time  in the care of  this patient.      [name], MD Medical Director [place] Pager: [phone] 01/07/2022 5:58 PM   To contact Stroke Continuity provider, please refer to [email or web address] After hours, contact General Neurology

## 2022-01-07 NOTE — Evaluation (Signed)
Physical Therapy Evaluation Patient Details Name: Travis Brewer MRN: [id number] DOB: Nov 14, 1934 Today's Date: 01/07/2022  History of Present Illness  The pt is an 87 yo male presenting 9/28 with L sided facial droop and L arm weakness. CT negative, given TNK. PMH includes: CAD s/p CABG, melonoma, chronic afib, CHF, DVT, PNA, and CVA x2.   Clinical Impression  Pt in bed upon arrival of PT, agreeable to evaluation at this time. Prior to admission the pt was independent without use of DME, reports he consistently exercises 3x/wk and is independent with ADLs at baseline. The pt now presents with limitations in functional mobility, dynamic stability, and endurance due to above dx, and will continue to benefit from skilled PT to address these deficits. The pt initially needed single UE support for gait in the room, but was able to progress to stairs with single rail and hallway ambulation with minG and no UE support. He does not tolerate balance challenges well and was limited to ~150 ft due to fatigue at this time. Recommend OPPT for balance and continued strengthening as well as short term use of RW (pt already has one at home).      Recommendations for follow up therapy are one component of a multi-disciplinary discharge planning process, led by the attending physician.  Recommendations may be updated based on patient status, additional functional criteria and insurance authorization.  Follow Up Recommendations Outpatient PT      Assistance Recommended at Discharge PRN  Patient can return home with the following  Assistance with cooking/housework;Direct supervision/assist for medications management;Direct supervision/assist for financial management;Assist for transportation;Help with stairs or ramp for entrance    Equipment Recommendations None recommended by PT  Recommendations for Other Services       Functional Status Assessment Patient has had a recent decline in their functional status  and demonstrates the ability to make significant improvements in function in a reasonable and predictable amount of time.     Precautions / Restrictions Precautions Precautions: Fall Restrictions Weight Bearing Restrictions: No      Mobility  Bed Mobility Overal bed mobility: Modified Independent             General bed mobility comments: increased time    Transfers Overall transfer level: Needs assistance Equipment used: Rolling walker (2 wheels), None Transfers: Sit to/from Stand Sit to Stand: Min guard           General transfer comment: minG to power up to standing, reaching for UE support and given RW. by end of session was able to complete without assist    Ambulation/Gait Ambulation/Gait assistance: Min assist, Min guard Gait Distance (Feet): 150 Feet Assistive device: Rolling walker (2 wheels), None Gait Pattern/deviations: Step-through pattern, Decreased stride length, Shuffle Gait velocity: decreased Gait velocity interpretation: <1.31 ft/sec, indicative of household ambulator   General Gait Details: pt initially reaching for UE support and needing minA to steady, able to progress from use of RW to no UE support with minG but continues to have poor tolerance for balance challenge  Stairs Stairs: Yes Stairs assistance: Min guard Stair Management: One rail Right, Alternating pattern, Forwards Number of Stairs: 3 General stair comments: minG with single UE support  Wheelchair Mobility    Modified Rankin (Stroke Patients Only) Modified Rankin (Stroke Patients Only) Pre-Morbid Rankin Score: No symptoms Modified Rankin: Moderate disability     Balance Overall balance assessment: Needs assistance Sitting-balance support: No upper extremity supported, Feet supported Sitting balance-Leahy Scale: Good  Standing balance support: Bilateral upper extremity supported, No upper extremity supported, During functional activity Standing balance-Leahy  Scale: Fair Standing balance comment: able to progress to gait without UE support but poor tolerance for balance challenge                             Pertinent Vitals/Pain Pain Assessment Pain Assessment: No/denies pain    Home Living Family/patient expects to be discharged to:: Private residence Living Arrangements: Spouse/significant other Available Help at Discharge: Family;Available 24 hours/day Type of Home: House Home Access: Stairs to enter Entrance Stairs-Rails: Right (grab bar in garage) Entrance Stairs-Number of Steps: 2-3   Home Layout: One level Home Equipment: Conservation officer, nature (2 wheels);Cane - single point;Grab bars - tub/shower;Shower seat      Prior Function Prior Level of Function : Independent/Modified Independent (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 otherwise independent at home     Hand Dominance   Dominant Hand: Right    Extremity/Trunk Assessment   Upper Extremity Assessment Upper Extremity Assessment: Defer to OT evaluation    Lower Extremity Assessment Lower Extremity Assessment: Overall WFL for tasks assessed (LLE with external rotation which pt reports is baseline. good strength)    Cervical / Trunk Assessment Cervical / Trunk Assessment: Kyphotic  Communication   Communication: No difficulties  Cognition Arousal/Alertness: Awake/alert Behavior During Therapy: WFL for tasks assessed/performed Overall Cognitive Status: Within Functional Limits for tasks assessed                                          General Comments General comments (skin integrity, edema, etc.): VSS on RA    Exercises     Assessment/Plan    PT Assessment Patient needs continued PT services  PT Problem List Decreased strength;Decreased range of motion;Decreased balance;Decreased mobility;Decreased  coordination       PT Treatment Interventions DME instruction;Gait training;Stair training;Functional mobility training;Therapeutic activities;Therapeutic exercise;Balance training;Neuromuscular re-education;Patient/family education    PT Goals (Current goals can be found in the Care Plan section)  Acute Rehab PT Goals Patient Stated Goal: return home and to exercise PT Goal Formulation: With patient Time For Goal Achievement: 01/21/22 Potential to Achieve Goals: Good    Frequency Min 4X/week        AM-PAC PT "6 Clicks" Mobility  Outcome Measure Help needed turning from your back to your side while in a flat bed without using bedrails?: None Help needed moving from lying on your back to sitting on the side of a flat bed without using bedrails?: A Little Help needed moving to and from a bed to a chair (including a wheelchair)?: A Little Help needed standing up from a chair using your arms (e.g., wheelchair or bedside chair)?: A Little Help needed to walk in hospital room?: A Little Help needed climbing 3-5 steps with a railing? : A Little 6 Click Score: 19    End of Session Equipment Utilized During Treatment: Gait belt Activity Tolerance: Patient tolerated treatment well Patient left: in chair;with call bell/phone within reach;with chair alarm set Nurse Communication: Mobility status PT Visit Diagnosis: Other abnormalities of gait and mobility (R26.89);Unsteadiness on feet (R26.81)    Time: 9381-8299 PT Time Calculation (min) (  ACUTE ONLY): 29 min   Charges:   PT Evaluation $PT Eval Low Complexity: 1 Low PT Treatments $Gait Training: 8-22 mins        [place] Carbo, PT, DPT   Acute Rehabilitation Department  Sandra Cockayne 01/07/2022, 12:45 PM

## 2022-01-07 NOTE — ED Notes (Signed)
Signature pad not working to sign for [employer]; pt verbalized understanding

## 2022-01-07 NOTE — Evaluation (Signed)
Occupational Therapy Evaluation Patient Details Name: Travis Brewer MRN: [id number] DOB: 01-11-1935 Today's Date: 01/07/2022   History of Present Illness The pt is an 87 yo male presenting 9/28 with L sided facial droop and L arm weakness. CT negative, given TNK. PMH includes: CAD s/p CABG, melonoma, chronic afib, CHF, DVT, PNA, and CVA x2.   Clinical Impression   PTA pt lives independently with his wife and exercises regularly. Pt has a hx of MD and has low vision deficits at baseline. Pt with mild coordination deficits L hand at this time. Will follow acutely to establish a fine motor/coordination HEP however do not anticipate with need for OT follow up. VSS     Recommendations for follow up therapy are one component of a multi-disciplinary discharge planning process, led by the attending physician.  Recommendations may be updated based on patient status, additional functional criteria and insurance authorization.   Follow Up Recommendations  No OT follow up    Assistance Recommended at Discharge Intermittent Supervision/Assistance  Patient can return home with the following Direct supervision/assist for medications management;Assist for transportation    Functional Status Assessment  Patient has had a recent decline in their functional status and demonstrates the ability to make significant improvements in function in a reasonable and predictable amount of time.  Equipment Recommendations  None recommended by OT    Recommendations for Other Services       Precautions / Restrictions Precautions Precautions: Fall Restrictions Weight Bearing Restrictions: No      Mobility Bed Mobility Overal bed mobility: Modified Independent             General bed mobility comments: increased time    Transfers Overall transfer level: Needs assistance Equipment used: Rolling walker (2 wheels), None Transfers: Sit to/from Stand Sit to Stand: Supervision           General  transfer comment: minG to power up to standing, reaching for UE support and given RW. by end of session was able to complete without assist      Balance Overall balance assessment: Needs assistance Sitting-balance support: No upper extremity supported, Feet supported Sitting balance-Leahy Scale: Good     Standing balance support: Bilateral upper extremity supported, No upper extremity supported, During functional activity Standing balance-Leahy Scale: Fair                             ADL either performed or assessed with clinical judgement   ADL                                         General ADL Comments: clsoe to baseline; discussed recommendation to use shower seat for bathing adn to have S with IADL tasks at this time     Vision Baseline Vision/History: 1 Wears glasses;6 Macular Degeneration Patient Visual Report: No change from baseline Additional Comments: uses magnifying glasses; compensatory strategies     Perception Perception Comments: note pt having difficulty sustaining grasp on papaer towel when wiping his face although strength is good; ? mild inattention   Praxis      Pertinent Vitals/Pain Pain Assessment Pain Assessment: No/denies pain     Hand Dominance Right   Extremity/Trunk Assessment Upper Extremity Assessment Upper Extremity Assessment: LUE deficits/detail LUE Deficits / Details: T thumb MP alignment  at baseline LUE Sensation: decreased light touch  LUE Coordination: decreased fine motor   Lower Extremity Assessment Lower Extremity Assessment: Overall WFL for tasks assessed (LLE with external rotation which pt reports is baseline. good strength)   Cervical / Trunk Assessment Cervical / Trunk Assessment: Kyphotic   Communication Communication Communication: No difficulties   Cognition Arousal/Alertness: Awake/alert Behavior During Therapy: WFL for tasks assessed/performed Overall Cognitive Status: No  family/caregiver present to determine baseline cognitive functioning                                 General Comments: appears at baseline     General Comments  VSS    Exercises     Shoulder Instructions      Home Living Family/patient expects to be discharged to:: Private residence Living Arrangements: Spouse/significant other Available Help at Discharge: Family;Available 24 hours/day Type of Home: House Home Access: Stairs to enter [employer] of Steps: 2-3 Entrance Stairs-Rails: Right (grab bar in garage) Home Layout: One level     Bathroom Shower/Tub: Occupational psychologist: Handicapped height Bathroom Accessibility: Yes How Accessible: Accessible via walker Home Equipment: Conservation officer, nature (2 wheels);Cane - single point;Grab bars - tub/shower;Shower seat      Lives With: Spouse    Prior Functioning/Environment Prior Level of Function : Independent/Modified Independent (not driving much due to macular degeneration)             Mobility Comments: independent, no falls ADLs Comments: sits for shower, wife helps dry his back. otherwise independent at home        OT Problem List: Impaired UE functional use      OT Treatment/Interventions: Therapeutic exercise;Neuromuscular education    OT Goals(Current goals can be found in the care plan section) Acute Rehab OT Goals Patient Stated Goal: to go home tomorrow OT Goal Formulation: With patient Time For Goal Achievement: 01/21/22 Potential to Achieve Goals: Good  OT Frequency: Min 2X/week    Co-evaluation              AM-PAC OT "6 Clicks" Daily Activity     Outcome Measure Help from another person eating meals?: None Help from another person taking care of personal grooming?: None Help from another person toileting, which includes using toliet, bedpan, or urinal?: None Help from another person bathing (including washing, rinsing, drying)?: None Help from another  person to put on and taking off regular upper body clothing?: None Help from another person to put on and taking off regular lower body clothing?: None 6 Click Score: 24   End of Session Nurse Communication: Mobility status  Activity Tolerance: Patient tolerated treatment well Patient left: in chair;with call bell/phone within reach;with chair alarm set  OT Visit Diagnosis: Muscle weakness (generalized) (M62.81);Unsteadiness on feet (R26.81)                Time: 6378-5885 OT Time Calculation (min): 20 min Charges:  OT General Charges $OT Visit: 1 Visit OT Evaluation $OT Eval Moderate Complexity: Lilly, OT/L   Acute OT Clinical Specialist Acute Rehabilitation Services Pager [phone] Office [phone]   [facility] 01/07/2022, 2:11 PM

## 2022-01-07 NOTE — Progress Notes (Signed)
PT Cancellation Note  Patient Details Name: GERRON GUIDOTTI MRN: [id number] DOB: 09/23/1934   Cancelled Treatment:    Reason Eval/Treat Not Completed: Active bedrest order remains this morning. Will evaluate once bedrest order removed.   West Carbo, PT, DPT   Acute Rehabilitation Department   Sandra Cockayne 01/07/2022, 8:55 AM

## 2022-01-08 ENCOUNTER — Other Ambulatory Visit (HOSPITAL_COMMUNITY): Payer: Self-pay

## 2022-01-08 ENCOUNTER — Inpatient Hospital Stay (HOSPITAL_COMMUNITY): Payer: Medicare Other

## 2022-01-08 ENCOUNTER — Telehealth (HOSPITAL_COMMUNITY): Payer: Self-pay

## 2022-01-08 DIAGNOSIS — I63411 Cerebral infarction due to embolism of right middle cerebral artery: Secondary | ICD-10-CM | POA: Diagnosis not present

## 2022-01-08 MED ORDER — POLYVINYL ALCOHOL 1.4 % OP SOLN
1.0000 [drp] | OPHTHALMIC | Status: DC | PRN
  Filled 2022-01-08: qty 15

## 2022-01-08 MED ORDER — POLYVINYL ALCOHOL 1.4 % OP SOLN: 1.0000 [drp] | OPHTHALMIC | 0 refills | Status: AC | PRN

## 2022-01-08 MED ORDER — APIXABAN 5 MG PO TABS: 5.0000 mg | ORAL_TABLET | Freq: Two times a day (BID) | ORAL | 1 refills | Status: DC

## 2022-01-08 MED ORDER — ATORVASTATIN CALCIUM 80 MG PO TABS
80.0000 mg | ORAL_TABLET | Freq: Every day | ORAL | Status: DC
  Filled 2022-01-08: qty 1

## 2022-01-08 MED ORDER — APIXABAN 5 MG PO TABS: 5.0000 mg | ORAL_TABLET | Freq: Two times a day (BID) | ORAL | Status: DC

## 2022-01-08 NOTE — Telephone Encounter (Signed)
Pharmacy Patient Advocate Encounter  Insurance verification completed.    The patient is insured through [employer]   The patient is currently admitted and ran test claims for the following: Eliquis '5mg'$ .  Copays and coinsurance results were relayed to Inpatient clinical team.

## 2022-01-08 NOTE — Discharge Instructions (Addendum)
Travis Brewer, you came to the hospital with left sided facial droop, left sided facial numbness, difficulty speaking, numbness of the left hand and left sided weakness.  These symptoms were caused by a stroke, and you were given TNK to treat this.  Your symptoms improved, and you will be discharged with a prescription for Eliquis to prevent further strokes.  You will need to follow up in the stroke clinic 6-8 weeks after discharge.  Information on my medicine - ELIQUIS (apixaban)  This medication education was reviewed with me or my healthcare representative as part of my discharge preparation.  Why was Eliquis prescribed for you? Eliquis was prescribed for you to reduce the risk of a blood clot forming that can cause a stroke if you have a medical condition called atrial fibrillation (a type of irregular heartbeat).  What do You need to know about Eliquis ? Take your Eliquis TWICE DAILY - one tablet in the morning and one tablet in the evening with or without food. If you have difficulty swallowing the tablet whole please discuss with your pharmacist how to take the medication safely.  Take Eliquis exactly as prescribed by your doctor and DO NOT stop taking Eliquis without talking to the doctor who prescribed the medication.  Stopping may increase your risk of developing a stroke.  Refill your prescription before you run out.  After discharge, you should have regular check-up appointments with your healthcare provider that is prescribing your Eliquis.  In the future your dose may need to be changed if your kidney function or weight changes by a significant amount or as you get older.  What do you do if you miss a dose? If you miss a dose, take it as soon as you remember on the same day and resume taking twice daily.  Do not take more than one dose of ELIQUIS at the same time to make up a missed dose.  Important Safety Information A possible side effect of Eliquis is bleeding. You should  call your healthcare provider right away if you experience any of the following: Bleeding from an injury or your nose that does not stop. Unusual colored urine (red or dark brown) or unusual colored stools (red or black). Unusual bruising for unknown reasons. A serious fall or if you hit your head (even if there is no bleeding).  Some medicines may interact with Eliquis and might increase your risk of bleeding or clotting while on Eliquis. To help avoid this, consult your healthcare provider or pharmacist prior to using any new prescription or non-prescription medications, including herbals, vitamins, non-steroidal anti-inflammatory drugs (NSAIDs) and supplements.  This website has more information on Eliquis (apixaban): http://www.eliquis.com/eliquis/home

## 2022-01-08 NOTE — TOC Transition Note (Signed)
Transition of Care [facility]) - CM/SW Discharge Note   Patient Details  Name: Travis Brewer MRN: [id number] Date of Birth: [birth year]  [facility] of [facility] CM/SW Contact:  Ella Bodo, RN Phone Number: 01/08/2022, 11:36 AM   Clinical Narrative:    The pt is an 87 yo male presenting 9/28 with L sided facial droop and L arm weakness. CT negative, given TNK. PTA, pt independent and living at home with spouse, who can provide needed assistance at dc.  PT recommending OP follow up, and patient agreeable to referral.  Referral made to [place] [facility].  No other dc needs identified.   PCP is Dr. Venida Jarvis.  Final next level of care: OP Rehab Barriers to Discharge: Barriers Resolved            Discharge Plan and Services   Discharge Planning Services: CM Consult                                 Social Determinants of Health (SDOH) Interventions     Readmission Risk Interventions     No data to display         Reinaldo Raddle, RN, BSN  Trauma/Neuro ICU Case Manager [phone]

## 2022-01-08 NOTE — Progress Notes (Signed)
[place] for Eliquis Indication: atrial fibrillation  Allergies  Allergen Reactions   Quetiapine Nausea And Vomiting    When taken with clonazepam (seprately they are fine)   Clonazepam Nausea And Vomiting    When taken with quetiapine (seprately they are fine)    Patient Measurements: Height: '5\' 7"'$  (170.2 cm) Weight: 77.6 kg (171 lb 1.2 oz) IBW/kg (Calculated) : 66.1  Vital Signs: Temp: 98.4 F (36.9 C) (09/29 1200) Temp Source: Axillary (09/29 1200) BP: 126/92 (09/29 1300) Pulse Rate: 75 (09/29 1300)  Labs: Recent Labs    01/07/22 0241 01/07/22 0248  HGB 14.7 14.3  HCT 44.3 42.0  PLT 147*  --   APTT 31  --   LABPROT 13.6  --   INR 1.1  --   CREATININE 0.96 0.90    Estimated Creatinine Clearance: 56.1 mL/min (by C-G formula based on SCr of 0.9 mg/dL).   Medical History: Past Medical History:  Diagnosis Date   Acute on chronic respiratory failure with hypoxia (HCC)    BPH (benign prostatic hyperplasia)    CAD (coronary artery disease)    Cancer ([place])    Melonoma on left shoulder blade removed   Cerebral amyloid angiopathy (HCC)    Chronic atrial fibrillation (HCC)    Chronic congestive heart failure with left ventricular diastolic dysfunction (HCC)    Diaphragmatic paralysis    Dyspnea    Dysrhythmia    ED (erectile dysfunction)    Hepatitis    Hep A   History of DVT of lower extremity    BLE DVT ~ 04/2019   Pneumonia    Stroke (cerebrum) ([place]) 03/2019   STROKE X 2    Medications:  Scheduled:    stroke: early stages of recovery book   Does not apply Once   apixaban  5 mg Oral BID   atorvastatin  80 mg Oral Daily   Chlorhexidine Gluconate Cloth  6 each Topical Daily   pantoprazole  40 mg Oral QHS    Assessment: 58 YOM with nonvalvular atrial fibrillation presenting with CVA. Previously not anticoagulated due to concern of amyloid angiopathy. Pharmacy consulted to start Eliquis in the setting of CVA thought  to be embolic in nature due to atrial fibrillation.  Goal of Therapy:  Monitor platelets by anticoagulation protocol: Yes   Plan:  Start Eliquis '5mg'$  BID - does not meet criteria for dose adjustment  Merrilee Jansky, PharmD Clinical Pharmacist 01/08/2022,2:06 PM

## 2022-01-08 NOTE — Progress Notes (Signed)
Occupational Therapy Treatment Patient Details Name: Travis Brewer MRN: [id number] DOB: 11-04-1934 Today's Date: 01/08/2022   History of present illness The pt is an 87 yo male presenting 9/28 with L sided facial droop and L arm weakness. CT negative, given TNK. PMH includes: CAD s/p CABG, melonoma, chronic afib, CHF, DVT, PNA, and CVA x2.   OT comments  Pt reports L hand is "about the same" but reports numbness/tingling around mouth and L eye - nsg notified. L eye is red and pt asking for eye drops. Focus of session on education regarding fine motor/coordination HEP. Pt verbalized understanding. Will follow.   Recommendations for follow up therapy are one component of a multi-disciplinary discharge planning process, led by the attending physician.  Recommendations may be updated based on patient status, additional functional criteria and insurance authorization.    Follow Up Recommendations  No OT follow up    Assistance Recommended at Discharge Intermittent Supervision/Assistance  Patient can return home with the following  Direct supervision/assist for medications management;Assist for transportation   Equipment Recommendations  None recommended by OT    Recommendations for Other Services      Precautions / Restrictions Precautions Precautions: Fall       Mobility Bed Mobility               General bed mobility comments: OOB in chair    Transfers     Transfers: Sit to/from Stand Sit to Stand: Supervision                 Balance     Sitting balance-Leahy Scale: Good       Standing balance-Leahy Scale: Fair                             ADL either performed or assessed with clinical judgement   ADL                                         General ADL Comments: Educated pt/wief on recommendation for direct S with bathing/showering; recommend S wtih medicaiton management; pt/wife verbalized understanding     Extremity/Trunk Assessment Upper Extremity Assessment Upper Extremity Assessment: LUE deficits/detail LUE Deficits / Details: L thumb MP alignment  at baseline; "clumsy" hand but using functionally            Vision       Perception     Praxis      Cognition Arousal/Alertness: Awake/alert Behavior During Therapy: WFL for tasks assessed/performed Overall Cognitive Status: History of cognitive impairments - at baseline                                 General Comments: Wife feels he is at baseline        Exercises      Shoulder Instructions       General Comments      Pertinent Vitals/ Pain       Pain Assessment Pain Assessment: No/denies pain  Home Living                                          Prior Functioning/Environment  Frequency  Min 2X/week        Progress Toward Goals  OT Goals(current goals can now be found in the care plan section)  Progress towards OT goals: Progressing toward goals  Acute Rehab OT Goals Patient Stated Goal: to go home OT Goal Formulation: With patient Time For Goal Achievement: 01/21/22 Potential to Achieve Goals: Good  Plan Discharge plan remains appropriate    Co-evaluation                 AM-PAC OT "6 Clicks" Daily Activity     Outcome Measure   Help from another person eating meals?: None Help from another person taking care of personal grooming?: None Help from another person toileting, which includes using toliet, bedpan, or urinal?: None Help from another person bathing (including washing, rinsing, drying)?: None Help from another person to put on and taking off regular upper body clothing?: None Help from another person to put on and taking off regular lower body clothing?: None 6 Click Score: 24    End of Session    OT Visit Diagnosis: Muscle weakness (generalized) (M62.81);Unsteadiness on feet (R26.81)   Activity Tolerance Patient tolerated  treatment well   Patient Left in chair;with call bell/phone within reach;with family/visitor present   Nurse Communication Other (comment) (pt complaining of numbness around mouth/eye; L eye red; pt asking for eye drops)        Time: 0071-2197 OT Time Calculation (min): 14 min  Charges: OT General Charges $OT Visit: 1 Visit OT Treatments $Therapeutic Exercise: 8-22 mins  Maurie Boettcher, OT/L   Acute OT Clinical Specialist [place] Pager [phone] Office [phone]   [facility] 01/08/2022, 9:37 AM

## 2022-01-08 NOTE — TOC Benefit Eligibility Note (Signed)
Patient Teacher, English as a foreign language completed.    The current 30 day co-pay is, $38.00 for Eliquis '5mg'$ .   The patient is insured through [place], will have to be filled at [facility].   Otis Brace, [place] Patient Advocate Specialist [place] Patient Advocate Team Direct Number: [phone]  Fax: [phone]

## 2022-01-08 NOTE — Discharge Summary (Addendum)
Stroke Discharge Summary  Patient ID: Travis Brewer   MRN: [id number]      DOB: Jul 24, 1934  Date of Admission: 01/07/2022 Date of Discharge: 01/08/2022  Attending Physician:  Stroke, Md, MD, Stroke MD Consultant(s):    None  Patient's PCP:  Willeen Niece, PA  DISCHARGE DIAGNOSIS: Right hemispheric embolic infarct s/p TNK, caused by atrial fibrillation not on anticoagulation  Principal Problem:   Stroke (cerebrum) ([place]) Mild cognitive impairment ? 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   atorvastatin 80 MG tablet Commonly known as: LIPITOR Take 1 tablet (80 mg total) by mouth daily.   finasteride 5 MG tablet Commonly known as: PROSCAR Take 5 mg by mouth daily.   furosemide 40 MG tablet Commonly known as: LASIX TAKE 1 TABLET DAILY What changed:  how much to take how to take this when to take this additional instructions   loratadine 10 MG tablet Commonly known as: CLARITIN Take 10 mg by mouth daily as needed for allergies.   melatonin 5 MG Tabs Take 5 mg by mouth.   metoprolol tartrate 25 MG tablet Commonly known as: LOPRESSOR Take 25 mg by mouth 2 (two) times daily.   MULTIVITAMIN ADULT EXTRA C PO Take 1 tablet by mouth daily.   PRESERVISION AREDS 2 PO Take 1 tablet by mouth 2 (two) times daily.   polyvinyl alcohol 1.4 % ophthalmic solution Commonly known as: LIQUIFILM TEARS Place 1 drop into both eyes as needed for dry eyes.   tadalafil 5 MG tablet Commonly known as: CIALIS Take 5 mg by  mouth daily.   Vitamin C 500 MG Caps Take 500 mg by mouth daily.        LABORATORY STUDIES CBC    Component Value Date/Time   WBC 6.6 01/07/2022 0241   RBC 4.30 01/07/2022 0241   HGB 14.3 01/07/2022 0248   HCT 42.0 01/07/2022 0248   PLT 147 (L) 01/07/2022 0241   MCV 103.0 (H) 01/07/2022 0241   MCH 34.2 (H) 01/07/2022 0241   MCHC 33.2 01/07/2022 0241   RDW 12.8 01/07/2022 0241   LYMPHSABS 2.7 01/07/2022 0241   MONOABS 0.6 01/07/2022 0241   EOSABS 0.3 01/07/2022 0241   BASOSABS 0.0 01/07/2022 0241   CMP    Component Value Date/Time   NA 137 01/07/2022 0248   NA 140 04/30/2020 1200   K 4.1 01/07/2022 0248   CL 105 01/07/2022 0248   CO2 22 01/07/2022 0241   GLUCOSE 95 01/07/2022 0248   BUN 21 01/07/2022 0248   BUN 19 04/30/2020 1200   CREATININE 0.90 01/07/2022 0248   CALCIUM 9.3 01/07/2022 0241   PROT 6.3 (L) 01/07/2022 0241   ALBUMIN 3.9 01/07/2022 0241   AST 34 01/07/2022 0241   ALT 28 01/07/2022 0241   ALKPHOS 83 01/07/2022 0241   BILITOT 0.7 01/07/2022 0241   GFRNONAA >60 01/07/2022 0241   GFRAA  66 04/30/2020 1200   COAGS Lab Results  Component Value Date   INR 1.1 01/07/2022   INR 1.1 06/05/2019   Lipid Panel    Component Value Date/Time   CHOL 92 01/07/2022 0241   TRIG 32 01/07/2022 0241   HDL 58 01/07/2022 0241   CHOLHDL 1.6 01/07/2022 0241   VLDL 6 01/07/2022 0241   LDLCALC 28 01/07/2022 0241   HgbA1C  Lab Results  Component Value Date   HGBA1C 5.1 01/07/2022   Urinalysis    Component Value Date/Time   COLORURINE YELLOW 06/30/2019 1612   APPEARANCEUR HAZY (A) 06/30/2019 1612   LABSPEC 1.014 06/30/2019 1612   PHURINE 7.0 06/30/2019 1612   GLUCOSEU NEGATIVE 06/30/2019 1612   HGBUR LARGE (A) 06/30/2019 1612   BILIRUBINUR NEGATIVE 06/30/2019 1612   KETONESUR NEGATIVE 06/30/2019 1612   PROTEINUR 100 (A) 06/30/2019 1612   NITRITE NEGATIVE 06/30/2019 1612   LEUKOCYTESUR MODERATE (A) 06/30/2019 1612   Urine Drug Screen No results found  for: "LABOPIA", "COCAINSCRNUR", "LABBENZ", "AMPHETMU", "THCU", "LABBARB"  Alcohol Level    Component Value Date/Time   ETH <10 01/07/2022 0241     SIGNIFICANT DIAGNOSTIC STUDIES CT HEAD WO CONTRAST  Result Date: 01/08/2022 CLINICAL DATA:  Stroke, follow-up, after tPA EXAM: CT HEAD WITHOUT CONTRAST TECHNIQUE: Contiguous axial images were obtained from the base of the skull through the vertex without intravenous contrast. RADIATION DOSE REDUCTION: This exam was performed according to the departmental dose-optimization program which includes automated exposure control, adjustment of the mA and/or kV according to patient size and/or use of iterative reconstruction technique. COMPARISON:  12/30/2021 CT head and 12/30/2021 MRI head FINDINGS: Brain: No definite hypodensity is seen to correlate with the acute infarcts seen on the 01/07/2022 MRI. No evidence of additional acute infarction, hemorrhage, mass, mass effect, or midline shift. No hydrocephalus or extra-axial fluid collection. Periventricular white matter changes, likely the sequela of chronic small vessel ischemic disease. Remote cerebellar infarcts. Vascular: No hyperdense vessel. Skull: Normal. Negative for fracture or focal lesion. Sinuses/Orbits: Mucosal thickening in the right maxillary sinus Status post bilateral lens replacements. Other: The mastoid air cells are well aerated. IMPRESSION: 1. No definite hypodensity is seen to correlate with the acute infarcts seen on the 01/07/2022 MRI. 2. No acute hemorrhage or evidence of additional infarct. Electronically Signed   By: Merilyn Baba M.D.   On: 01/08/2022 03:12   MR BRAIN WO CONTRAST  Result Date: 01/07/2022 CLINICAL DATA:  Stroke follow-up. Left arm weakness and facial droop. EXAM: MRI HEAD WITHOUT CONTRAST TECHNIQUE: Multiplanar, multiecho pulse sequences of the brain and surrounding structures were obtained without intravenous contrast. COMPARISON:  Head CT and CTA 01/07/2022 FINDINGS:  Brain: Small acute right MCA territory infarcts involve frontoparietal cortex and subcortical white matter as well as the corona radiata. A few scattered chronic cerebral and cerebellar microhemorrhages are noted, nonspecific but could be related to chronic hypertension. Patchy T2 hyperintensities in the cerebral white matter bilaterally are nonspecific but compatible with moderate chronic small vessel ischemic disease. There are chronic infarcts involving the right greater than left cerebellar hemispheres. There is mild cerebral atrophy. No mass, midline shift, or extra-axial fluid collection is identified. Vascular: Major intracranial vascular flow voids are preserved. Skull and upper cervical spine: Unremarkable bone marrow signal. Sinuses/Orbits: Bilateral cataract extraction. Moderate mucosal thickening in the right maxillary sinus. Clear mastoid air cells. Other: None. IMPRESSION: 1. Small acute right MCA territory infarcts. 2. Moderate chronic small vessel ischemic disease. 3. Chronic cerebellar infarcts. Electronically Signed  By: Logan Bores M.D.   On: 01/07/2022 22:15   ECHOCARDIOGRAM COMPLETE  Result Date: 01/07/2022    ECHOCARDIOGRAM REPORT   Patient Name:   RAYYAN BURLEY Date of Exam: 01/07/2022 Medical Rec #:  [id number]      Height:       67.0 in Accession #:    [id number]     Weight:       171.1 lb Date of Birth:  November 24, 1934      BSA:          1.892 m Patient Age:    39 years       BP:           113/77 mmHg Patient Gender: M              HR:           71 bpm. Exam Location:  Inpatient Procedure: 2D Echo, Cardiac Doppler and Color Doppler Indications:    Stroke  History:        Patient has prior history of Echocardiogram examinations, most                 recent 12/21/2019. CHF, CAD, Stroke, Arrythmias:Atrial                 Fibrillation; Signs/Symptoms:Dyspnea.  Sonographer:    Memory Argue Referring Phys: Merlín.Osler MCNEILL P [place]  1. Left ventricular ejection fraction, by  estimation, is 60 to 65%. The left ventricle has normal function. The left ventricle has no regional wall motion abnormalities. There is mild left ventricular hypertrophy. Left ventricular diastolic parameters are indeterminate. There is the interventricular septum is flattened in diastole ('D' shaped left ventricle), consistent with right ventricular volume overload.  2. Right ventricular systolic function is mildly reduced. The right ventricular size is normal. There is mildly elevated pulmonary artery systolic pressure.  3. Left atrial size was mildly dilated.  4. The mitral valve is grossly normal. Mild mitral valve regurgitation.  5. Tricuspid valve regurgitation is moderate.  6. The aortic valve was not well visualized. Aortic valve regurgitation is mild.  7. There is mild dilatation of the aortic root, measuring 42 mm. There is mild dilatation of the ascending aorta, measuring 42 mm.  8. The inferior vena cava is normal in size with greater than 50% respiratory variability, suggesting right atrial pressure of 3 mmHg. Comparison(s): No significant change from prior study. FINDINGS  Left Ventricle: Left ventricular ejection fraction, by estimation, is 60 to 65%. The left ventricle has normal function. The left ventricle has no regional wall motion abnormalities. The left ventricular internal cavity size was normal in size. There is  mild left ventricular hypertrophy. The interventricular septum is flattened in diastole ('D' shaped left ventricle), consistent with right ventricular volume overload. Left ventricular diastolic parameters are indeterminate. Right Ventricle: The right ventricular size is normal. Right ventricular systolic function is mildly reduced. There is mildly elevated pulmonary artery systolic pressure. The tricuspid regurgitant velocity is 3.07 m/s, and with an assumed right atrial pressure of 3 mmHg, the estimated right ventricular systolic pressure is 97.3 mmHg. Left Atrium: Left atrial  size was mildly dilated. Right Atrium: Right atrial size was normal in size. Pericardium: There is no evidence of pericardial effusion. Mitral Valve: The mitral valve is grossly normal. Mild mitral valve regurgitation. Tricuspid Valve: The tricuspid valve is grossly normal. Tricuspid valve regurgitation is moderate. Aortic Valve: The aortic valve was not well visualized. Aortic valve regurgitation is mild. Aortic  regurgitation PHT measures 846 msec. Aortic valve mean gradient measures 4.0 mmHg. Aortic valve peak gradient measures 8.2 mmHg. Aortic valve area, by VTI measures 2.61 cm. Pulmonic Valve: Pulmonic valve regurgitation is not visualized. Aorta: There is mild dilatation of the aortic root, measuring 42 mm. There is mild dilatation of the ascending aorta, measuring 42 mm. Venous: The inferior vena cava is normal in size with greater than 50% respiratory variability, suggesting right atrial pressure of 3 mmHg. IAS/Shunts: No atrial level shunt detected by color flow Doppler.  LEFT VENTRICLE PLAX 2D LVIDd:         3.90 cm LVIDs:         2.60 cm LV PW:         1.10 cm LV IVS:        1.10 cm LVOT diam:     2.30 cm LV SV:         69 LV SV Index:   36 LVOT Area:     4.15 cm  RIGHT VENTRICLE RV S prime:     7.72 cm/s TAPSE (M-mode): 1.3 cm LEFT ATRIUM             Index        RIGHT ATRIUM           Index LA diam:        4.50 cm 2.38 cm/m   RA Area:     22.10 cm LA Vol (A2C):   81.5 ml 43.07 ml/m  RA Volume:   55.30 ml  29.22 ml/m LA Vol (A4C):   74.2 ml 39.21 ml/m LA Biplane Vol: 77.9 ml 41.17 ml/m  AORTIC VALVE AV Area (Vmax):    2.93 cm AV Area (Vmean):   2.89 cm AV Area (VTI):     2.61 cm AV Vmax:           143.00 cm/s AV Vmean:          95.300 cm/s AV VTI:            0.263 m AV Peak Grad:      8.2 mmHg AV Mean Grad:      4.0 mmHg LVOT Vmax:         101.00 cm/s LVOT Vmean:        66.300 cm/s LVOT VTI:          0.165 m LVOT/AV VTI ratio: 0.63 AI PHT:            846 msec  AORTA Ao Root diam: 4.20 cm Ao  Asc diam:  4.20 cm MR Peak grad: 60.7 mmHg   TRICUSPID VALVE MR Vmax:      389.50 cm/s TR Peak grad:   37.7 mmHg                           TR Vmax:        307.00 cm/s                            SHUNTS                           Systemic VTI:  0.16 m                           Systemic Diam: 2.30 cm Phineas Inches Electronically signed by Phineas Inches Signature Date/Time:  01/07/2022/11:19:27 AM    Final    CT ANGIO HEAD NECK W WO CM (CODE STROKE)  Result Date: 01/07/2022 CLINICAL DATA:  Stroke suspected EXAM: CT ANGIOGRAPHY HEAD AND NECK TECHNIQUE: Multidetector CT imaging of the head and neck was performed using the standard protocol during bolus administration of intravenous contrast. Multiplanar CT image reconstructions and MIPs were obtained to evaluate the vascular anatomy. Carotid stenosis measurements (when applicable) are obtained utilizing NASCET criteria, using the distal internal carotid diameter as the denominator. RADIATION DOSE REDUCTION: This exam was performed according to the departmental dose-optimization program which includes automated exposure control, adjustment of the mA and/or kV according to patient size and/or use of iterative reconstruction technique. CONTRAST:  47m OMNIPAQUE IOHEXOL 350 MG/ML SOLN COMPARISON:  01/07/2022 CT head, no prior CTA FINDINGS: CT HEAD FINDINGS For noncontrast findings, please see same day CT head. CTA NECK FINDINGS Aortic arch: Standard branching. Imaged portion shows no evidence of dissection. Ascending aortic diameter is likely at the upper limit of normal in size. Aortic atherosclerosis. No significant stenosis of the major arch vessel origins. Right carotid system: No evidence of dissection, occlusion, or hemodynamically significant stenosis (greater than 50%). Atherosclerotic disease at the bifurcation and in the proximal ICA is not hemodynamically significant. Left carotid system: No evidence of dissection, occlusion, or hemodynamically significant stenosis  (greater than 50%). Atherosclerotic disease at the bifurcation and in the proximal ICA is not hemodynamically significant. Vertebral arteries: No evidence of dissection, occlusion, or hemodynamically significant stenosis (greater than 50%). Skeleton: Poor dentition with periapical lucency about the right maxillary first molar roots, with upward bowing and possible dehiscence of the floor of the maxillary sinus (series 9, image 77). No acute fracture or suspicious osseous lesion. Other neck: No acute finding. Upper chest: No focal pulmonary opacity or pleural effusion. Emphysema. Review of the MIP images confirms the above findings CTA HEAD FINDINGS Anterior circulation: Both internal carotid arteries are patent to the termini, without significant stenosis. A1 segments patent. Normal anterior communicating artery. Anterior cerebral arteries are patent to their distal aspects. No M1 stenosis or occlusion. MCA branches perfused and symmetric. Posterior circulation: Vertebral arteries patent to the vertebrobasilar junction without stenosis. Posterior inferior cerebellar arteries patent proximally. Basilar patent to its distal aspect. Mild prominence of the basilar tip, favored to be secondary to the branching pattern rather than truly aneurysmal. Superior cerebellar arteries patent proximally. Patent P1 segments, with mild narrowing in the right P1. PCAs otherwise perfused to their distal aspects without stenosis. The bilateral posterior communicating arteries are not visualized. Venous sinuses: As permitted by contrast timing, patent. Anatomic variants: None significant. Review of the MIP images confirms the above findings IMPRESSION: 1. No intracranial large vessel occlusion or significant stenosis. 2. No hemodynamically significant stenosis in the neck. 3. Poor dentition with periapical lucency about the right maxillary first molar roots, with upward bowing and possible dehiscence of the floor of the maxillary sinus,  which, in conjunction with significant mucosal thickening in the right maxillary sinus, is concerning for odontogenic sinusitis. Correlate with dental exam. Electronically Signed   By: AMerilyn BabaM.D.   On: 01/07/2022 03:11   CT HEAD CODE STROKE WO CONTRAST  Result Date: 01/07/2022 CLINICAL DATA:  Code stroke. EXAM: CT HEAD WITHOUT CONTRAST TECHNIQUE: Contiguous axial images were obtained from the base of the skull through the vertex without intravenous contrast. RADIATION DOSE REDUCTION: This exam was performed according to the departmental dose-optimization program which includes automated exposure control, adjustment of the mA  and/or kV according to patient size and/or use of iterative reconstruction technique. COMPARISON:  None Available. FINDINGS: Brain: No evidence of acute infarction, hemorrhage, cerebral edema, mass, mass effect, or midline shift. No hydrocephalus or extra-axial collection. Periventricular white matter changes, likely the sequela of chronic small vessel ischemic disease. Remote right cerebellar infarct. Vascular: No hyperdense vessel. Skull: Negative for fracture or focal lesion. Sinuses/Orbits: Mucosal thickening in the right maxillary sinus, anterior ethmoid air cells, and bilateral frontal sinuses. Other: The mastoid air cells are well aerated. ASPECTS [facility] Stroke Program Early CT Score) - Ganglionic level infarction (caudate, lentiform nuclei, internal capsule, insula, M1-M3 cortex): 7 - Supraganglionic infarction (M4-M6 cortex): 3 Total score (0-10 with 10 being normal): 10 IMPRESSION: 1. No acute intracranial process. 2. ASPECTS is 10 Code stroke imaging results were communicated on 01/07/2022 at 2:51 am to provider Dr. Leonel Ramsay via secure text paging. Electronically Signed   By: Merilyn Baba M.D.   On: 01/07/2022 02:52      HISTORY OF PRESENT ILLNESS Patient with a history of stroke and atrial fibrillation presented with sudden onset left facial droop, left facial  numbness as well as left arm weakness and numbness.   HOSPITAL COURSE Patient was given TNK to treat his stroke, and symptoms improved with only a small amount of residual left facial numbness remaining. MRI revels small acute right MCA territory infarcts, likely cardioembolic in the setting of atrial fibrillation without anticoagulation.  Patient had not been started on anticoagulation due to concerns about CAA, but this is not seen on MRI (several small scattered microhemorrhages are present, possibly from hypertension).  Patient will be started on Eliquis to prevent further strokes.  RN Pressure Injury Documentation:     DISCHARGE EXAM Blood pressure (!) 126/92, pulse 75, temperature 98.4 F (36.9 C), temperature source Axillary, resp. rate 18, height '5\' 7"'$  (1.702 m), weight 77.6 kg, SpO2 (!) 84 %. General:  Alert, well-developed, well-nourished elderly patient in no acute distress Respiratory:  Regular, unlabored respirations on room air  NEURO:  Mental Status: AA&Ox3 able to name 10 animals that can walk on 4 legs.  Clock drawing 4/4.  Recall 2/3. Speech/Language: speech is without dysarthria or aphasia.  Fluency, and comprehension intact.  Cranial Nerves:  II: PERRL. Visual fields full.  III, IV, VI: EOMI. Eyelids elevate symmetrically.  V: Sensation is intact to light touch with some numbness to the left side of the nose VII: Smile is symmetrical.   VIII: hearing intact to voice. IX, X: Phonation is normal.  WI:OXBDZHGD shrug 5/5. XII: tongue is midline without fasciculations. Motor: 5/5 strength to all muscle groups tested.  Tone: is normal and bulk is normal Sensation- Intact to light touch bilaterally with trace numbness in fingertips of left hand Coordination: FTN intact bilaterally.No drift.  Gait- deferred   Discharge Diet       Diet   Diet regular Room service appropriate? Yes; Fluid consistency: Thin   liquids  DISCHARGE PLAN Disposition:  home Eliquis  (apixaban) daily for secondary stroke prevention Consider particpation in [place] AF study ( eliquis versus Factor 11 inhibitor Milvexian ) patient given written information to review at home and decide and call if interested Ongoing stroke risk factor control by Primary Care Physician at time of discharge Follow-up PCP Willeen Niece, PA in 2 weeks. Follow-up in [facility] in 8 weeks, office to schedule an appointment.   32 minutes were spent preparing discharge.  [place] , MSN,  AGACNP-BC [facility] See Amion for schedule and pager information 01/08/2022 2:39 PM   I have personally obtained history,examined this patient, reviewed notes, independently viewed imaging studies, participated in medical decision making and plan of care.ROS completed by me personally and pertinent positives fully documented  I have made any additions or clarifications directly to the above note. Agree with note above.    [name], MD Medical Director [facility] Pager: [phone] 01/08/2022 5:18 PM

## 2022-01-08 NOTE — Progress Notes (Signed)
D/C education given to Pt and all questions answered. No printed prescriptions to give or equipment to deliver. Ivs removed. Pt taken to car with all belongings.

## 2022-01-11 ENCOUNTER — Telehealth: Payer: Self-pay

## 2022-01-11 NOTE — Patient Outreach (Signed)
Received a red flag Emmi stroke notification for Mr. Mayorquin . I have assigned Enzo Montgomery, RN to call for follow up and determine if there are any Case Management needs.    [facility], [place], [place] Management [phone]

## 2022-01-11 NOTE — Patient Outreach (Signed)
  [facility] Care Coordination Follow Up  01/11/2022 Name:  Travis Brewer MRN:  [id number] DOB:  11/17/1934  Subjective: Travis Brewer is a 87 y.o. year old male who is a primary care patient of Willeen Niece, [place]   An Emmi alert was received indicating patient responded to questions: Scheduled a follow-up appointment?.   I reached out by phone to follow up on the alert. No answer. RN CM left HIPAA compliant voicemail along with contact info.    Follow up plan:  RN CM will make outreach attempt to patient within three business days if no return call.   Enzo Montgomery, RN,BSN,CCM [place] Management Telephonic Care Management Coordinator Direct Phone: [phone] Toll Free: [phone] Fax: [phone]

## 2022-01-11 NOTE — Telephone Encounter (Signed)
This encounter was created in error - please disregard.

## 2022-01-12 ENCOUNTER — Telehealth: Payer: Self-pay

## 2022-01-12 ENCOUNTER — Ambulatory Visit: Payer: Medicare Other | Attending: Neurology | Admitting: Physical Therapy

## 2022-01-12 ENCOUNTER — Encounter: Payer: Self-pay | Admitting: Physical Therapy

## 2022-01-12 DIAGNOSIS — Z7409 Other reduced mobility: Secondary | ICD-10-CM | POA: Diagnosis present

## 2022-01-12 DIAGNOSIS — I639 Cerebral infarction, unspecified: Secondary | ICD-10-CM | POA: Insufficient documentation

## 2022-01-12 DIAGNOSIS — R293 Abnormal posture: Secondary | ICD-10-CM | POA: Diagnosis present

## 2022-01-12 DIAGNOSIS — R262 Difficulty in walking, not elsewhere classified: Secondary | ICD-10-CM | POA: Insufficient documentation

## 2022-01-12 DIAGNOSIS — R2689 Other abnormalities of gait and mobility: Secondary | ICD-10-CM | POA: Diagnosis present

## 2022-01-12 DIAGNOSIS — M6281 Muscle weakness (generalized): Secondary | ICD-10-CM | POA: Diagnosis not present

## 2022-01-12 NOTE — Therapy (Signed)
OUTPATIENT PHYSICAL THERAPY NEURO EVALUATION   Patient Name: Travis Brewer MRN: [id number] DOB:05-Nov-1934, 87 y.o., male Today's Date: 01/12/2022   PCP: Tonye Royalty PROVIDER: Leonie Man   PT End of Session - 01/12/22 1404     Visit Number 1    Date for PT Re-Evaluation 04/14/22    Authorization Type Medicare    PT Start Time 1400    PT Stop Time 9833    PT Time Calculation (min) 45 min    Activity Tolerance Patient tolerated treatment well    Behavior During Therapy WFL for tasks assessed/performed             Past Medical History:  Diagnosis Date   Acute on chronic respiratory failure with hypoxia (HCC)    BPH (benign prostatic hyperplasia)    CAD (coronary artery disease)    Cancer ([place])    Melonoma on left shoulder blade removed   Cerebral amyloid angiopathy (HCC)    Chronic atrial fibrillation (HCC)    Chronic congestive heart failure with left ventricular diastolic dysfunction (HCC)    Diaphragmatic paralysis    Dyspnea    Dysrhythmia    ED (erectile dysfunction)    Hepatitis    Hep A   History of DVT of lower extremity    BLE DVT ~ 04/2019   Pneumonia    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   STROKE X 2   Past Surgical History:  Procedure Laterality Date   ASCENDING AORTIC ANEURYSM REPAIR  02/22/2019   32 mm Hemashild graft, STJ to ascending interposition graft, resuspension of AV ([place])   BACK SURGERY     CARDIAC CATHETERIZATION  02/15/2019   [facility]   CORONARY ARTERY BYPASS GRAFT  02/22/2019   LIMA to LAD, SVG to D1 [facility])   EYE SURGERY Bilateral    Cataract surgery   INGUINAL HERNIA REPAIR Right 11/14/2019   Procedure: RIGHT INGUINAL HERNIA REPAIR WITH MESH;  Surgeon: Donnie Mesa, MD;  Location: [place];  Service: General;  Laterality: Right;  LMA AND TAP BLOCK   JOINT REPLACEMENT Bilateral    knee replacement   L3-L4 Back surgery     LEFT ATRIAL APPENDAGE OCCLUSION N/A 05/29/2020   Procedure: LEFT ATRIAL  APPENDAGE OCCLUSION;  Surgeon: Sherren Mocha, MD;  Location: [place] CV LAB;  Service: Cardiovascular;  Laterality: N/A;   PEG TUBE PLACEMENT     sciatic nerve cyst removal     TEE WITHOUT CARDIOVERSION N/A 05/29/2020   Procedure: TRANSESOPHAGEAL ECHOCARDIOGRAM (TEE);  Surgeon: Sherren Mocha, MD;  Location: [place] [facility];  Service: Cardiovascular;  Laterality: N/A;   TONSILLECTOMY     TRACHEOSTOMY     Patient Active Problem List   Diagnosis Date Noted   Stroke (cerebrum) ([place]) 01/07/2022   Persistent atrial fibrillation ([place]) 05/21/2020   Secondary hypercoagulable state ([place]) 05/21/2020   Right inguinal hernia 11/14/2019   Acute on chronic respiratory failure with hypoxia (HCC)    Diaphragmatic paralysis    History of DVT of lower extremity    Chronic congestive heart failure with left ventricular diastolic dysfunction (HCC)    Chronic atrial fibrillation ([place])     ONSET DATE: 01/07/22  REFERRING DIAG: stroke  THERAPY DIAG:  Muscle weakness (generalized)  Impairment of balance  Difficulty in walking, not elsewhere classified  Rationale for Evaluation and Treatment Rehabilitation  SUBJECTIVE:  SUBJECTIVE STATEMENT: Patient reports that on the 28th of September he started feeling left side numbness.  He got up to go to the bathroom and he could not use the left leg, called 911 and at the ED and was treated.  He spent 2 nights in the hospital.  He reports some difficulty with the left hand and weakness in the left leg  Pt accompanied by: significant other  PERTINENT HISTORY: see above  PAIN:  Are you having pain? No  PRECAUTIONS: Fall  WEIGHT BEARING RESTRICTIONS No  FALLS: Has patient fallen in last 6 months? No  LIVING ENVIRONMENT: Lives with: lives with their  family Lives in: House/apartment Stairs: Yes: External: 3 steps; can reach both Has following equipment at home: Single point cane  PLOF: Independent driving, walked without any device, was working out in the gym 2-3x/week, did some travel  PATIENT GOALS be stronger, walk better, use the left arm and hand better  OBJECTIVE:   COGNITION: Overall cognitive status: Within functional limits for tasks assessed   SENSATION: WFL left thumb and index finger numb and left lip and nose  COORDINATION: Slight incoordination with left hand nose tocuh with eyes closed  POSTURE: rounded shoulders and forward head  LOWER EXTREMITY MMT:     Active  Right Eval Left Eval  Hip flexion 4 4  Hip extension    Hip abduction 4+ 4+  Hip adduction    Hip internal rotation    Hip external rotation    Knee flexion 4 4-  Knee extension 4 4  Ankle dorsiflexion 1/5 but this is an old drop foot 4  Ankle plantarflexion    Ankle inversion    Ankle eversion     (Blank rows = not tested) Right Grip:  47#  Left Grip: 40#   TRANSFERS: Has to use hands to go from sit to stand  GAIT: With SPC, has an exaggerated right hip flexion to clear the right drop foot, slight antalgic on the left, uses the cane in the right hand  FUNCTIONAL TESTs:  5 times sit to stand: 16 has to use arms to get up Timed up and go (TUG): 18 seconds no device  PATIENT SURVEYS:  FOTO 46  TODAY'S TREATMENT:  Bike level 4 x 10 minutes   PATIENT EDUCATION: Education details: reviewed his HEP and included hand exercises Person educated: Patient and Spouse Education method: Explanation, Demonstration, and Verbal cues Education comprehension: verbalized understanding   HOME EXERCISE PROGRAM: Reviewed from hospital    GOALS: Goals reviewed with patient? Yes  SHORT TERM GOALS: Target date: 01/26/22  Independent with initial HEP Goal status: INITIAL  LONG TERM GOALS: Target date: 04/06/2022  Return to the  independent gym program Goal status: INITIAL  2.  Return to driving Goal status: INITIAL  3.  Decrease TUG to 12 seconds Goal status: INITIAL  4.  5xSTS in 14 seconds Goal status: INITIAL  ASSESSMENT:  CLINICAL IMPRESSION: Patient is a 87 y.o. male who was seen today for physical therapy evaluation and treatment for CVA with left side weakness, he is doing well overall, has mild deficits especially in the left hand and the left face, him and his wife report improvement since the hospitalization.  He is slightly slower and a little off balance, he has not driven and needs to use hands to stand up.    OBJECTIVE IMPAIRMENTS Abnormal gait, cardiopulmonary status limiting activity, decreased activity tolerance, decreased balance, decreased coordination, decreased endurance, decreased mobility, difficulty walking,  decreased ROM, decreased strength, and postural dysfunction.   REHAB POTENTIAL: Good  CLINICAL DECISION MAKING: Evolving/moderate complexity  EVALUATION COMPLEXITY: Low  PLAN: PT FREQUENCY: 1-2x/week  PT DURATION: 12 weeks  PLANNED INTERVENTIONS: Therapeutic exercises, Therapeutic activity, Neuromuscular re-education, Balance training, Gait training, Patient/Family education, Self Care, Joint mobilization, Stair training, Manual therapy, and Re-evaluation  PLAN FOR NEXT SESSION: start gym activities, use of the left hand    Birtha Hatler W, PT 01/12/2022, 2:05 PM

## 2022-01-12 NOTE — Patient Outreach (Signed)
  [place] Stroke Care Coordination Follow Up  01/12/2022 Name:  KASHIS PENLEY MRN:  [id number] DOB:  09-20-1934  Subjective: Travis Brewer is a 87 y.o. year old male who is a primary care patient of Willeen Niece, [place]   An EMMI alert was received indicating patient responded to questions: Scheduled a follow-up appointment?.   I reached out by phone to follow up on the alert and spoke to  spouse . She reports patient is currently not in the home. Reviewed and addressed red alert. Spouse states that patient called MD office yesterday and made follow up appts. She will be driving him to appts. She confirms patient has all his meds. No other issues or concerns at this time.   Follow up plan:  No further interventions needed a this time. Advised caregiver that they would continue to get automated EMMI-Stroke post discharge calls to assess how they are doing following recent hospitalization and will receive a call from a nurse if any of their responses were abnormal. Caregiver voiced understanding and was appreciative of f/u call.    Enzo Montgomery, RN,BSN,CCM [facility] Telephonic Care Management Coordinator Direct Phone: [phone] Toll Free: [phone] Fax: [phone]

## 2022-01-12 NOTE — Progress Notes (Unsigned)
Cardiology Office Note:    Date:  01/13/2022   ID:  Travis Brewer, DOB [birth year], MRN [id number]  PCP:  Travis Brewer, [place] Cardiologist: Pixie Casino, MD   Reason for visit: Hospital follow-up  History of Present Illness:    Travis Brewer is a 87 y.o. male with a hx of aneurysm replacement, resuspension of the aortic valve and CABG x2 with LIMA to LAD, SVG to D1 in 2020, postop a-fib & CVA.  He has had 2 failed watchman implant attempts.  With cerebral amyloid angiopathy, he was thought to high risk to take anticoagulation (per [facility] health neurology). Therefore he was treated with low-dose aspirin.  Saw Dr. Debara Pickett on November 27, 2021 discussed his concern of stroke risk given him not being on anticoagulation.  Dr. Debara Pickett referred him to [facility] neurology for second opinion on risks/benefits of anticoagulation.  Patient states he canceled his appointment "to avoid too many cooks in the kitchen."  Unfortunately, he was admitted for CVA in September 2023.  Patient had sudden onset of left facial droop, left facial numbness and left arm weakness/numbness.  Per notes, "He was given TNK with symptom improvement.  MRI revealed small acute right MCA territory infarcts, likely cardioembolic in the setting of atrial fibrillation without anticoagulation.  Patient had not been started on anticoagulation due to concerns about CAA, but this was not seen on MRI (several small scattered microhemorrhages are present, possibly from hypertension).  Patient will be started on Eliquis to prevent further strokes."    Today, he comes in with his wife.  He states within 15 minutes of getting TNK, he had dramatic improvement in mobility of his left arm.  He still has residual 1 inch circumference numbness in his upper left lip as well as decreased sensation in the tips of his left thumb and pointer finger.  He denies bleeding issues thus far with Eliquis.  He asked my opinion on continuing  Eliquis vs. entering the [place] AF study (eliquis versus Factor 11 inhibitor Milvexian).    From a A-fib standpoint, he denies palpitations.  Other medications he takes include Lasix 40 mg daily.  He states he has been on this dose for a long time with remote history of leg swelling.  He denies PND, orthopnea and weight gain.  He mentions some lightheadedness with bending forward.  He denies chest pain.  For long time, he has exercise regularly and has already restarted post stroke.  He is still continuing PT for fine motor on his left hand.    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   STROKE X 2    Past Surgical History:  Procedure Laterality Date   ASCENDING AORTIC ANEURYSM REPAIR  02/22/2019   32 mm Hemashild graft, STJ to ascending interposition graft, resuspension  of AV ([facility] [place])   BACK SURGERY     CARDIAC CATHETERIZATION  02/15/2019   [facility]   CORONARY ARTERY BYPASS GRAFT  02/22/2019   LIMA to LAD, SVG to D1 [facility])   EYE SURGERY Bilateral    Cataract surgery   INGUINAL HERNIA REPAIR Right 11/14/2019   Procedure: RIGHT INGUINAL HERNIA REPAIR WITH MESH;  Surgeon: Donnie Mesa, MD;  Location: [place];  Service: General;  Laterality: Right;  LMA AND TAP BLOCK   JOINT REPLACEMENT Bilateral    knee replacement   L3-L4 Back surgery     LEFT ATRIAL APPENDAGE OCCLUSION N/A 05/29/2020    Procedure: LEFT ATRIAL APPENDAGE OCCLUSION;  Surgeon: Sherren Mocha, MD;  Location: [place] [facility];  Service: Cardiovascular;  Laterality: N/A;   PEG TUBE PLACEMENT     sciatic nerve cyst removal     TEE WITHOUT CARDIOVERSION N/A 05/29/2020   Procedure: TRANSESOPHAGEAL ECHOCARDIOGRAM (TEE);  Surgeon: Sherren Mocha, MD;  Location: [place] [facility];  Service: Cardiovascular;  Laterality: N/A;   TONSILLECTOMY     TRACHEOSTOMY      Current Medications: Current Meds  Medication Sig   apixaban (ELIQUIS) 5 MG TABS tablet Take 1 tablet (5 mg total) by mouth 2 (two) times daily.   Ascorbic Acid (VITAMIN C) 500 MG CAPS Take 500 mg by mouth daily.    atorvastatin (LIPITOR) 80 MG tablet Take 1 tablet (80 mg total) by mouth daily.   finasteride (PROSCAR) 5 MG tablet Take 5 mg by mouth daily.   furosemide (LASIX) 20 MG tablet Take 1 tablet (20 mg total) by mouth daily. May also take an additional tablet as needed for swelling.   loratadine (CLARITIN) 10 MG tablet Take 10 mg by mouth daily as needed for allergies.   melatonin 5 MG TABS Take 5 mg by mouth.   metoprolol tartrate (LOPRESSOR) 25 MG tablet Take 25 mg by mouth 2 (two) times daily.    Multiple Vitamins-Minerals (MULTIVITAMIN ADULT EXTRA C PO) Take 1 tablet by mouth daily.   Multiple Vitamins-Minerals (PRESERVISION AREDS 2 PO) Take 1 tablet by mouth 2 (two) times daily.   polyvinyl alcohol (LIQUIFILM TEARS) 1.4 % ophthalmic solution Place 1 drop into both eyes as needed for dry eyes.   tadalafil (CIALIS) 5 MG tablet Take 5 mg by mouth daily.   [DISCONTINUED] furosemide (LASIX) 40 MG tablet TAKE 1 TABLET DAILY (Patient taking differently: Take 40 mg by mouth daily.)     Allergies:   Quetiapine and Clonazepam   Social History   Socioeconomic History   Marital status: Married    Spouse name: Not on file   Number of children: Not on file   Years of education: Not on file   Highest education level: Not on file  Occupational History    Not on file  Tobacco Use   Smoking status: Never   Smokeless tobacco: Never  Vaping Use   Vaping Use: Never used  Substance and Sexual Activity   Alcohol use: Not on file    Comment: nightly beer/ wine (2-3 drinks)   Drug use: Never   Sexual activity: Not on file  Other Topics Concern   Not on file  Social History Narrative   Right handed   Social Determinants of Health   Financial Resource Strain: Not on file  Food Insecurity: Not on file  Transportation Needs: Not on file  Physical Activity: Not on file  Stress: Not on file  Social  Connections: Not on file     Family History: The patient's family history includes Diabetes in his mother; Stroke in his father.  ROS:   Please see the history of present illness.     EKGs/Labs/Other Studies Reviewed:    Recent Labs: 01/07/2022: ALT 28; BUN 21; Creatinine, Ser 0.90; Hemoglobin 14.3; Platelets 147; Potassium 4.1; Sodium 137   Recent Lipid Panel Lab Results  Component Value Date/Time   CHOL 92 01/07/2022 02:41 AM   TRIG 32 01/07/2022 02:41 AM   HDL 58 01/07/2022 02:41 AM   LDLCALC 28 01/07/2022 02:41 AM    Physical Exam:    VS:  BP 106/70   Pulse 60   Ht '5\' 7"'$  (1.702 m)   Wt 164 lb 6.4 oz (74.6 kg)   SpO2 94%   BMI 25.75 kg/m    No data found.       Wt Readings from Last 3 Encounters:  01/13/22 164 lb 6.4 oz (74.6 kg)  01/07/22 171 lb 1.2 oz (77.6 kg)  11/27/21 166 lb 1.6 oz (75.3 kg)     GEN:  Well nourished, well developed in no acute distress, looks younger than stated age 81: Normal NECK: No JVD; No carotid bruits CARDIAC: RRR, no murmurs, rubs, gallops RESPIRATORY:  Clear to auscultation without rales, wheezing or rhonchi  ABDOMEN: Soft, non-tender, non-distended MUSCULOSKELETAL: No edema; No deformity  SKIN: Warm and dry NEUROLOGIC:  Alert and oriented PSYCHIATRIC:  Normal affect    ASSESSMENT AND PLAN   Acute Right MCA CVA in 12/2021 -While off anticoagulation -Scheduled to  follow-up with Dr. Leonie Man in November -Continue Eliquis 5 mg twice daily for now.   -Dr. Lars Mage (EP) and Dr. Leonie Man discussed Eartha Inch AF study (Eliquis versus Factor 11 inhibitor Milvexian) with him.  Eartha Inch study is investigating whether Milvexian can reduce thrombotic events with no increased risk of bleeding.  Patient seems like a good candidate for this trial.   -To minimize risk of fall while on anticoagulation, I recommend patient lower his Lasix dose to 20 mg daily with his soft blood pressure. -Advised patient to consider DNR conversation given high risk for intracranial bleed. -Continue PT/regular exercise.  CAD, no angina -CABG x2 (LIMA to LAD, SVG to D1)-11/20, [facility] -Continue Lipitor and beta-blocker.  Persistent atrial fibrillation, asymptomatic -Prev not anticoagulated secondary concern of intracranial bleeding with his cerebral amyloid angiopathy.  With recurrent CVA, he is now on Eliquis '5mg'$  BID. -Continue metoprolol for rate control.  Valvular heart disease, euvolemic -Mild MR and severe TR on echo 2023, preserved EF -Decreased Lasix dose as above.  Monitor for leg swelling.  Hyperlipidemia with goal LDL less than 70 -LDL 28 in September 2023.  Continue Lipitor.  Disposition - Follow-up in 1 year with Dr. Debara Pickett.   Medication Adjustments/Labs and Tests Ordered: Current medicines are reviewed at length with the patient today.  Concerns regarding medicines are outlined above.  No orders of the defined types were placed in this encounter.  Meds ordered this encounter  Medications   furosemide (LASIX) 20 MG tablet    Sig: Take 1 tablet (20 mg total) by mouth daily. May also take an additional tablet as needed for swelling.    Dispense:  90 tablet    Refill:  3    Patient Instructions  Medication Instructions:  Your physician has recommended you make the following change in your medication:  DECREASE: Lasix '20mg'$  daily. You may take an  extra tablet as needed for  swelling.  *If you need a refill on your cardiac medications before your next appointment, please call your pharmacy*   Lab Work: NONE ordered at this time of appointment   If you have labs (blood work) drawn today and your tests are completely normal, you will receive your results only by: [place] (if you have MyChart) OR A paper copy in the mail If you have any lab test that is abnormal or we need to change your treatment, we will call you to review the results.   Testing/Procedures: NONE ordered at this time of appointment     Follow-Up: At [facility], you and your health needs are our priority.  As part of our continuing mission to provide you with exceptional heart care, we have created designated Provider Care Teams.  These Care Teams include your primary Cardiologist (physician) and Advanced Practice Providers (APPs -  Physician Assistants and Nurse Practitioners) who all work together to provide you with the care you need, when you need it.  We recommend signing up for the patient portal called "MyChart".  Sign up information is provided on this After Visit Summary.  MyChart is used to connect with patients for Virtual Visits (Telemedicine).  Patients are able to view lab/test results, encounter notes, upcoming appointments, etc.  Non-urgent messages can be sent to your provider as well.   To learn more about what you can do with MyChart, go to [email or web address].    Your next appointment:   1 year(s)  The format for your next appointment:   In Person  Provider:   Pixie Casino, MD      Signed, Warren Lacy, PA-C  01/13/2022 12:34 PM    [place]

## 2022-01-13 ENCOUNTER — Ambulatory Visit: Payer: Medicare Other | Attending: Physician Assistant | Admitting: Physician Assistant

## 2022-01-13 ENCOUNTER — Encounter: Payer: Self-pay | Admitting: Physician Assistant

## 2022-01-13 VITALS — BP 106/70 | HR 60 | Ht 67.0 in | Wt 164.4 lb

## 2022-01-13 DIAGNOSIS — Z951 Presence of aortocoronary bypass graft: Secondary | ICD-10-CM | POA: Diagnosis not present

## 2022-01-13 DIAGNOSIS — I4819 Other persistent atrial fibrillation: Secondary | ICD-10-CM | POA: Insufficient documentation

## 2022-01-13 DIAGNOSIS — I071 Rheumatic tricuspid insufficiency: Secondary | ICD-10-CM | POA: Insufficient documentation

## 2022-01-13 DIAGNOSIS — I63511 Cerebral infarction due to unspecified occlusion or stenosis of right middle cerebral artery: Secondary | ICD-10-CM | POA: Diagnosis not present

## 2022-01-13 MED ORDER — FUROSEMIDE 20 MG PO TABS: 20.0000 mg | ORAL_TABLET | Freq: Every day | ORAL | 3 refills | Status: DC

## 2022-01-13 NOTE — Patient Instructions (Signed)
Medication Instructions:  Your physician has recommended you make the following change in your medication:  DECREASE: Lasix '20mg'$  daily. You may take an extra tablet as needed for swelling.  *If you need a refill on your cardiac medications before your next appointment, please call your pharmacy*   Lab Work: NONE ordered at this time of appointment   If you have labs (blood work) drawn today and your tests are completely normal, you will receive your results only by: [place] (if you have MyChart) OR A paper copy in the mail If you have any lab test that is abnormal or we need to change your treatment, we will call you to review the results.   Testing/Procedures: NONE ordered at this time of appointment     Follow-Up: At [facility], you and your health needs are our priority.  As part of our continuing mission to provide you with exceptional heart care, we have created designated Provider Care Teams.  These Care Teams include your primary Cardiologist (physician) and Advanced Practice Providers (APPs -  Physician Assistants and Nurse Practitioners) who all work together to provide you with the care you need, when you need it.  We recommend signing up for the patient portal called "MyChart".  Sign up information is provided on this After Visit Summary.  MyChart is used to connect with patients for Virtual Visits (Telemedicine).  Patients are able to view lab/test results, encounter notes, upcoming appointments, etc.  Non-urgent messages can be sent to your provider as well.   To learn more about what you can do with MyChart, go to [email or web address].    Your next appointment:   1 year(s)  The format for your next appointment:   In Person  Provider:   Pixie Casino, MD

## 2022-01-14 ENCOUNTER — Telehealth: Payer: Self-pay | Admitting: Neurology

## 2022-01-14 NOTE — Telephone Encounter (Signed)
Pt is calling. Stated he has a question about the clinical trial that Dr. Leonie Man wants him to do. Requesting a call back.

## 2022-01-14 NOTE — Telephone Encounter (Signed)
I have reached out to the research department to see if they could call and discuss his questions.

## 2022-01-19 ENCOUNTER — Encounter: Payer: Self-pay | Admitting: Physical Therapy

## 2022-01-19 ENCOUNTER — Ambulatory Visit: Payer: Medicare Other | Admitting: Physical Therapy

## 2022-01-19 DIAGNOSIS — R262 Difficulty in walking, not elsewhere classified: Secondary | ICD-10-CM

## 2022-01-19 DIAGNOSIS — R2689 Other abnormalities of gait and mobility: Secondary | ICD-10-CM

## 2022-01-19 DIAGNOSIS — M6281 Muscle weakness (generalized): Secondary | ICD-10-CM

## 2022-01-19 DIAGNOSIS — Z7409 Other reduced mobility: Secondary | ICD-10-CM

## 2022-01-19 DIAGNOSIS — R293 Abnormal posture: Secondary | ICD-10-CM

## 2022-01-19 NOTE — Therapy (Signed)
OUTPATIENT PHYSICAL THERAPY NEURO Treatment   Patient Name: Travis Brewer MRN: [id number] DOB:[birth year], 87 y.o., male Today's Date: 01/19/2022   PCP: Tonye Royalty PROVIDER: Leonie Man   PT End of Session - 01/19/22 1403     Visit Number 2    Date for PT Re-Evaluation 04/14/22    Authorization Type Medicare    PT Start Time 6599    PT Stop Time 1440    PT Time Calculation (min) 43 min    Activity Tolerance Patient tolerated treatment well    Behavior During Therapy WFL for tasks assessed/performed             Past Medical History:  Diagnosis Date   Acute on chronic respiratory failure with hypoxia (HCC)    BPH (benign prostatic hyperplasia)    CAD (coronary artery disease)    Cancer ([place])    Melonoma on left shoulder blade removed   Cerebral amyloid angiopathy (HCC)    Chronic atrial fibrillation (HCC)    Chronic congestive heart failure with left ventricular diastolic dysfunction (HCC)    Diaphragmatic paralysis    Dyspnea    Dysrhythmia    ED (erectile dysfunction)    Hepatitis    Hep A   History of DVT of lower extremity    BLE DVT ~ 04/2019   Pneumonia    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   STROKE X 2   Past Surgical History:  Procedure Laterality Date   ASCENDING AORTIC ANEURYSM REPAIR  02/22/2019   32 mm Hemashild graft, STJ to ascending interposition graft, resuspension of AV ([place])   BACK SURGERY     CARDIAC CATHETERIZATION  [facility]/15/2019   [facility]   CORONARY ARTERY BYPASS GRAFT  02/22/2019   LIMA to LAD, SVG to D1 [facility])   EYE SURGERY Bilateral    Cataract surgery   INGUINAL HERNIA REPAIR Right 11/14/2019   Procedure: RIGHT INGUINAL HERNIA REPAIR WITH MESH;  Surgeon: Donnie Mesa, MD;  Location: [place];  Service: General;  Laterality: Right;  LMA AND TAP BLOCK   JOINT REPLACEMENT Bilateral    knee replacement   L3-L4 Back surgery     LEFT ATRIAL APPENDAGE OCCLUSION N/A 05/29/2020   Procedure: LEFT ATRIAL  APPENDAGE OCCLUSION;  Surgeon: Sherren Mocha, MD;  Location: [place] CV LAB;  Service: Cardiovascular;  Laterality: N/A;   PEG TUBE PLACEMENT     sciatic nerve cyst removal     TEE WITHOUT CARDIOVERSION N/A 05/29/2020   Procedure: TRANSESOPHAGEAL ECHOCARDIOGRAM (TEE);  Surgeon: Sherren Mocha, MD;  Location: [place] CV LAB;  Service: Cardiovascular;  Laterality: N/A;   TONSILLECTOMY     TRACHEOSTOMY     Patient Active Problem List   Diagnosis Date Noted   Stroke (cerebrum) ([place]) 01/07/2022   Persistent atrial fibrillation ([place]) 05/21/2020   Secondary hypercoagulable state ([place]) 05/21/2020   Right inguinal hernia 11/14/2019   Acute on chronic respiratory failure with hypoxia (HCC)    Diaphragmatic paralysis    History of DVT of lower extremity    Chronic congestive heart failure with left ventricular diastolic dysfunction (HCC)    Chronic atrial fibrillation ([place])     ONSET DATE: 01/07/22  REFERRING DIAG: stroke  THERAPY DIAG:  Muscle weakness (generalized)  Impairment of balance  Difficulty in walking, not elsewhere classified  Impaired functional mobility, balance, gait, and endurance  Abnormal posture  Rationale for Evaluation and Treatment Rehabilitation  SUBJECTIVE:  SUBJECTIVE STATEMENT: Patient reports that he is getting stronger.    PERTINENT HISTORY: see above  PAIN:  Are you having pain? No  PRECAUTIONS: Fall  WEIGHT BEARING RESTRICTIONS No  FALLS: Has patient fallen in last 6 months? No  LIVING ENVIRONMENT: Lives with: lives with their family Lives in: House/apartment Stairs: Yes: External: 3 steps; can reach both Has following equipment at home: Single point cane  PLOF: Independent driving, walked without any device, was working out in the gym  2-3x/week, did some travel  PATIENT GOALS be stronger, walk better, use the left arm and hand better  OBJECTIVE:   COGNITION: Overall cognitive status: Within functional limits for tasks assessed   SENSATION: WFL left thumb and index finger numb and left lip and nose  COORDINATION: Slight incoordination with left hand nose tocuh with eyes closed  POSTURE: rounded shoulders and forward head  LOWER EXTREMITY MMT:     Active  Right Eval Left Eval  Hip flexion 4 4  Hip extension    Hip abduction 4+ 4+  Hip adduction    Hip internal rotation    Hip external rotation    Knee flexion 4 4-  Knee extension 4 4  Ankle dorsiflexion 1/5 but this is an old drop foot 4  Ankle plantarflexion    Ankle inversion    Ankle eversion     (Blank rows = not tested) Right Grip:  47#  Left Grip: 40#   TRANSFERS: Has to use hands to go from sit to stand  GAIT: With SPC, has an exaggerated right hip flexion to clear the right drop foot, slight antalgic on the left, uses the cane in the right hand  FUNCTIONAL TESTs:  5 times sit to stand: 16 has to use arms to get up Timed up and go (TUG): 18 seconds no device  PATIENT SURVEYS:  FOTO 46  TODAY'S TREATMENT:  01/19/22 Walk around the back building with two rest breaks one standing.  Difficulty on the slopes, and the curbs, some CGA at times on the curbs Feet on ball K2C, trunk rotation, small bridges, isometric abs Bridge with clamshell and with ball squeeze Step over sticks Cone toe touches volleyball   PATIENT EDUCATION: Education details: reviewed his HEP and included hand exercises Person educated: Patient and Spouse Education method: Explanation, Demonstration, and Verbal cues Education comprehension: verbalized understanding   HOME EXERCISE PROGRAM: Reviewed from hospital    GOALS: Goals reviewed with patient? Yes  SHORT TERM GOALS: Target date: 01/26/22  Independent with initial HEP Goal status: met  LONG TERM  GOALS: Target date: 04/13/22  Return to the independent gym program Goal status: INITIAL  2.  Return to driving Goal status: INITIAL  3.  Decrease TUG to 12 seconds Goal status: INITIAL  4.  5xSTS in 14 seconds Goal status: INITIAL  ASSESSMENT:  CLINICAL IMPRESSION: Difficulty walking up the slope, he also reported the volleyball was very difficult for him due to the balance issues with the neuropathy.He is weak posterior  OBJECTIVE IMPAIRMENTS Abnormal gait, cardiopulmonary status limiting activity, decreased activity tolerance, decreased balance, decreased coordination, decreased endurance, decreased mobility, difficulty walking, decreased ROM, decreased strength, and postural dysfunction.   REHAB POTENTIAL: Good  CLINICAL DECISION MAKING: Evolving/moderate complexity  EVALUATION COMPLEXITY: Low  PLAN: PT FREQUENCY: 1-2x/week  PT DURATION: 12 weeks  PLANNED INTERVENTIONS: Therapeutic exercises, Therapeutic activity, Neuromuscular re-education, Balance training, Gait training, Patient/Family education, Self Care, Joint mobilization, Stair training, Manual therapy, and Re-evaluation  PLAN FOR NEXT  SESSION: start gym activities, use of the left hand    Paxtyn Boyar W, PT 01/19/2022, 2:19 PM

## 2022-01-25 ENCOUNTER — Telehealth: Payer: Self-pay

## 2022-01-25 NOTE — Patient Outreach (Signed)
  [facility] Care Coordination Follow Up  01/25/2022 Name:  Travis Brewer MRN:  [id number] DOB:  May 01, 1934  Subjective: Travis Brewer is a 87 y.o. year old male who is a primary care patient of Willeen Niece, [place]   An Emmi alert was received indicating patient responded to questions: Went to follow-up appointment?.   I reached out by phone to follow up on the alert.  Care Coordination Interventions Activated:  Yes  Care Coordination Interventions:  Yes, provided   Follow up plan:  RN CM will make outreach attempt within 3 business days if no return call.    Encounter Outcome:  No Answer   Enzo Montgomery, RN,BSN,CCM [place] Management Telephonic Care Management Coordinator Direct Phone: [phone] Toll Free: [phone] Fax: [phone]

## 2022-01-25 NOTE — Patient Outreach (Signed)
  [facility] Care Coordination Follow Up  01/25/2022 Name:  Travis Brewer MRN:  [id number] DOB:  Dec 04, 1934  Subjective: Travis Brewer is a 87 y.o. year old male who is a primary care patient of Willeen Niece, [place]   An Emmi alert was received indicating patient responded to questions: Went to follow-up appointment?.   Incoming call from patient returning RN CM call. He voices that he is doing well. He has been doing therapy a few times per week. Reviewed and addressed red alert. Confirmed with pt that he has completed PCP and cardiology appt. He has upcoming neuro appt. No issues with transportation. He denies any RN CM needs or concerns at this time.   Care Coordination Interventions Activated:  Yes  Care Coordination Interventions:  Yes, provided   Follow up plan: No further intervention required. Patient has completed automated EMMI-STROKE post discharge calls.   Encounter Outcome:  Pt. Visit Completed   Enzo Montgomery, RN,BSN,CCM [facility] Telephonic Care Management Coordinator Direct Phone: [phone] Toll Free: [phone] Fax: [phone]

## 2022-01-28 NOTE — Therapy (Signed)
OUTPATIENT PHYSICAL THERAPY NEURO Treatment   Patient Name: Travis Brewer MRN: [id number] DOB:1934-11-06, 87 y.o., male Today's Date: 01/29/2022   PCP: Tonye Royalty PROVIDER: Leonie Man   PT End of Session - 01/29/22 1059     Visit Number 3    Date for PT Re-Evaluation 04/14/22    Authorization Type Medicare    PT Start Time 1100    Activity Tolerance Patient tolerated treatment well    Behavior During Therapy [facility] for tasks assessed/performed              Past Medical History:  Diagnosis Date   Acute on chronic respiratory failure with hypoxia (HCC)    BPH (benign prostatic hyperplasia)    CAD (coronary artery disease)    Cancer ([place])    Melonoma on left shoulder blade removed   Cerebral amyloid angiopathy (HCC)    Chronic atrial fibrillation (HCC)    Chronic congestive heart failure with left ventricular diastolic dysfunction (HCC)    Diaphragmatic paralysis    Dyspnea    Dysrhythmia    ED (erectile dysfunction)    Hepatitis    Hep A   History of DVT of lower extremity    BLE DVT ~ 04/2019   Pneumonia    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   STROKE X 2   Past Surgical History:  Procedure Laterality Date   ASCENDING AORTIC ANEURYSM REPAIR  02/22/2019   32 mm Hemashild graft, STJ to ascending interposition graft, resuspension of AV ([facility] [place])   BACK SURGERY     CARDIAC CATHETERIZATION  02/15/2019   [facility]   CORONARY ARTERY BYPASS GRAFT  02/22/2019   LIMA to LAD, SVG to D1 [facility])   EYE SURGERY Bilateral    Cataract surgery   INGUINAL HERNIA REPAIR Right 11/14/2019   Procedure: RIGHT INGUINAL HERNIA REPAIR WITH MESH;  Surgeon: Donnie Mesa, MD;  Location: [place];  Service: General;  Laterality: Right;  LMA AND TAP BLOCK   JOINT REPLACEMENT Bilateral    knee replacement   L3-L4 Back surgery     LEFT ATRIAL APPENDAGE OCCLUSION N/A 05/29/2020   Procedure: LEFT ATRIAL APPENDAGE OCCLUSION;  Surgeon: Sherren Mocha, MD;   Location: [place] [facility];  Service: Cardiovascular;  Laterality: N/A;   PEG TUBE PLACEMENT     sciatic nerve cyst removal     TEE WITHOUT CARDIOVERSION N/A 05/29/2020   Procedure: TRANSESOPHAGEAL ECHOCARDIOGRAM (TEE);  Surgeon: Sherren Mocha, MD;  Location: [place] [facility];  Service: Cardiovascular;  Laterality: N/A;   TONSILLECTOMY     TRACHEOSTOMY     Patient Active Problem List   Diagnosis Date Noted   Stroke (cerebrum) ([place]) 01/07/2022   Persistent atrial fibrillation ([place]) 05/21/2020   Secondary hypercoagulable state ([place]) 05/21/2020   Right inguinal hernia 11/14/2019   Acute on chronic respiratory failure with hypoxia (HCC)    Diaphragmatic paralysis    History of DVT of lower extremity    Chronic congestive heart failure with left ventricular diastolic dysfunction (HCC)    Chronic atrial fibrillation ([place])     ONSET DATE: 01/07/22  REFERRING DIAG: stroke  THERAPY DIAG:  Muscle weakness (generalized)  Impairment of balance  Difficulty in walking, not elsewhere classified  Impaired functional mobility, balance, gait, and endurance  Abnormal posture  Rationale for Evaluation and Treatment Rehabilitation  SUBJECTIVE:  SUBJECTIVE STATEMENT: I am doing fine, just have some numbness in my thumb and finger and around my lip.   PERTINENT HISTORY: see above  PAIN:  Are you having pain? No  PRECAUTIONS: Fall  WEIGHT BEARING RESTRICTIONS No  FALLS: Has patient fallen in last 6 months? No  LIVING ENVIRONMENT: Lives with: lives with their family Lives in: House/apartment Stairs: Yes: External: 3 steps; can reach both Has following equipment at home: Single point cane  PLOF: Independent driving, walked without any device, was working out in the gym 2-3x/week, did some  travel  PATIENT GOALS be stronger, walk better, use the left arm and hand better  OBJECTIVE:   COGNITION: Overall cognitive status: Within functional limits for tasks assessed   SENSATION: WFL left thumb and index finger numb and left lip and nose  COORDINATION: Slight incoordination with left hand nose tocuh with eyes closed  POSTURE: rounded shoulders and forward head  LOWER EXTREMITY MMT:     Active  Right Eval Left Eval  Hip flexion 4 4  Hip extension    Hip abduction 4+ 4+  Hip adduction    Hip internal rotation    Hip external rotation    Knee flexion 4 4-  Knee extension 4 4  Ankle dorsiflexion 1/5 but this is an old drop foot 4  Ankle plantarflexion    Ankle inversion    Ankle eversion     (Blank rows = not tested) Right Grip:  47#  Left Grip: 40#   TRANSFERS: Has to use hands to go from sit to stand  GAIT: With SPC, has an exaggerated right hip flexion to clear the right drop foot, slight antalgic on the left, uses the cane in the right hand  FUNCTIONAL TESTs:  5 times sit to stand: 16 has to use arms to get up Timed up and go (TUG): 18 seconds no device  PATIENT SURVEYS:  FOTO 46  TODAY'S TREATMENT:  01/29/22 Bike L4 x30mns  Step up on stairs 6"  Calf raises 2x10  Side steps on airex  Walking on beam  S2S with yellow ball press out 2x10 Resisted gait 30# forwards and side steps x4  LAQ 20# 2x10  HS curls 35# 2x10   01/19/22 Walk around the back building with two rest breaks one standing.  Difficulty on the slopes, and the curbs, some CGA at times on the curbs Feet on ball K2C, trunk rotation, small bridges, isometric abs Bridge with clamshell and with ball squeeze Step over sticks Cone toe touches volleyball   PATIENT EDUCATION: Education details: reviewed his HEP and included hand exercises Person educated: Patient and Spouse Education method: Explanation, Demonstration, and Verbal cues Education comprehension: verbalized  understanding   HOME EXERCISE PROGRAM: Reviewed from hospital    GOALS: Goals reviewed with patient? Yes  SHORT TERM GOALS: Target date: 01/26/22  Independent with initial HEP Goal status: met  LONG TERM GOALS: Target date: 04/13/22  Return to the independent gym program Goal status: MET  2.  Return to driving Goal status: MET  3.  Decrease TUG to 12 seconds  10/20- 9.96 Goal status: MET  4.  5xSTS in 14 seconds  10/20- 14.59s Goal status: IN PROGRESS  ASSESSMENT:  CLINICAL IMPRESSION: Patient states he feels back to normal and feels like he is able to continue doing his independent gym program as usual. States he does not need any more appointments and he is good to go today. We did some Le strengthening and  balance in today's session.   OBJECTIVE IMPAIRMENTS Abnormal gait, cardiopulmonary status limiting activity, decreased activity tolerance, decreased balance, decreased coordination, decreased endurance, decreased mobility, difficulty walking, decreased ROM, decreased strength, and postural dysfunction.   REHAB POTENTIAL: Good  CLINICAL DECISION MAKING: Evolving/moderate complexity  EVALUATION COMPLEXITY: Low  PLAN: PT FREQUENCY: 1-2x/week  PT DURATION: 12 weeks  PLANNED INTERVENTIONS: Therapeutic exercises, Therapeutic activity, Neuromuscular re-education, Balance training, Gait training, Patient/Family education, Self Care, Joint mobilization, Stair training, Manual therapy, and Re-evaluation  PLAN FOR NEXT SESSION: start gym activities, use of the left hand  PHYSICAL THERAPY DISCHARGE SUMMARY  Visits from Start of Care: 3   Patient agrees to discharge. Patient goals were partially met. Patient is being discharged due to being pleased with the current functional level.    [place], PT 01/29/2022, 11:45 AM

## 2022-01-29 ENCOUNTER — Ambulatory Visit: Payer: Medicare Other

## 2022-01-29 DIAGNOSIS — R2689 Other abnormalities of gait and mobility: Secondary | ICD-10-CM

## 2022-01-29 DIAGNOSIS — Z7409 Other reduced mobility: Secondary | ICD-10-CM

## 2022-01-29 DIAGNOSIS — R262 Difficulty in walking, not elsewhere classified: Secondary | ICD-10-CM

## 2022-01-29 DIAGNOSIS — R293 Abnormal posture: Secondary | ICD-10-CM

## 2022-01-29 DIAGNOSIS — M6281 Muscle weakness (generalized): Secondary | ICD-10-CM | POA: Diagnosis not present

## 2022-02-09 IMAGING — CR DG CHEST 2V
2 series · 2 of 2 positions shown · non-contrast
Comparison: Chest radiograph June 15, 2019.

CLINICAL DATA: Sent here from a-fib clinic. Hx of a-fib, CAD,
stroke, PNA, CHF Nonsmoker

EXAM:
CHEST - 2 VIEW

[chest pa]
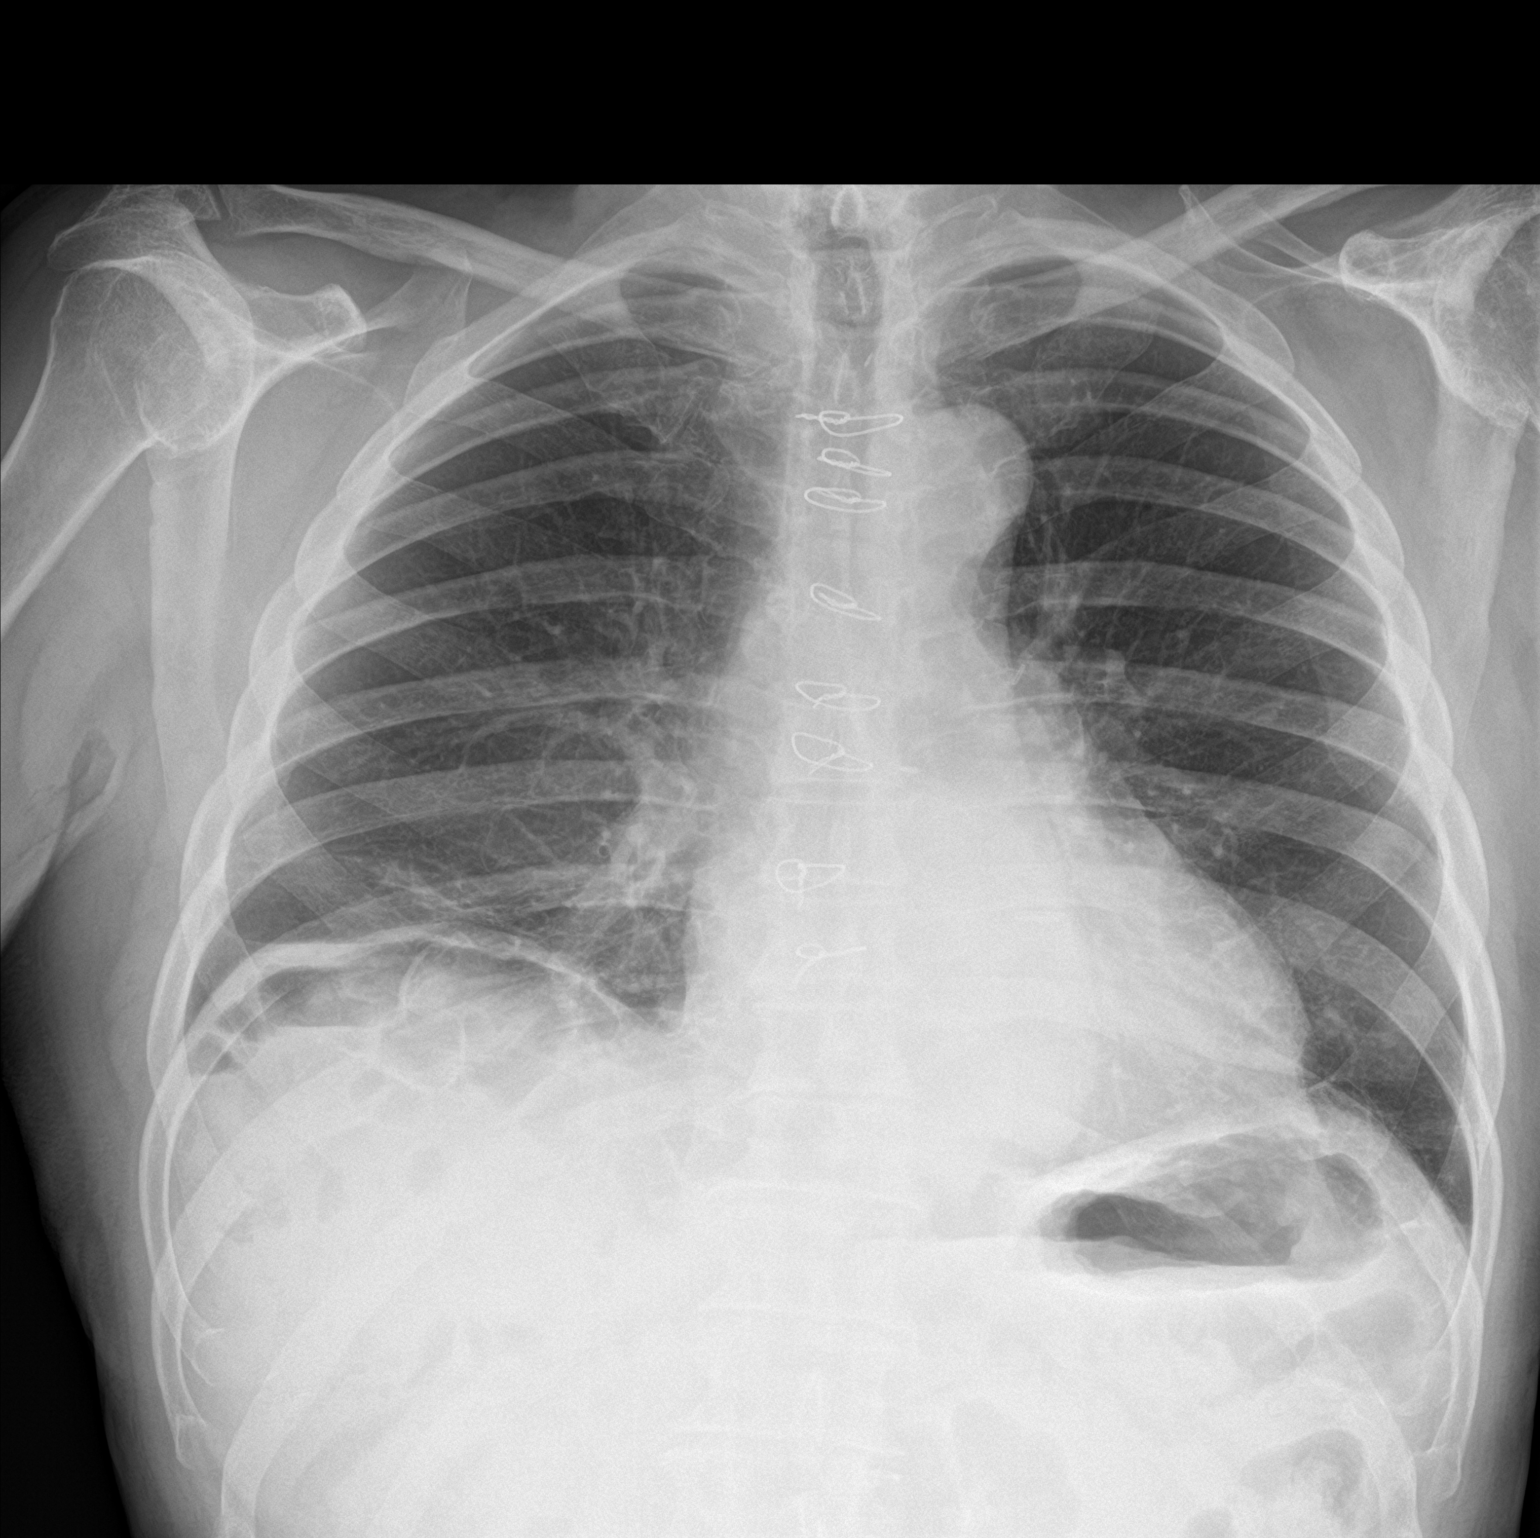

[chest lat]
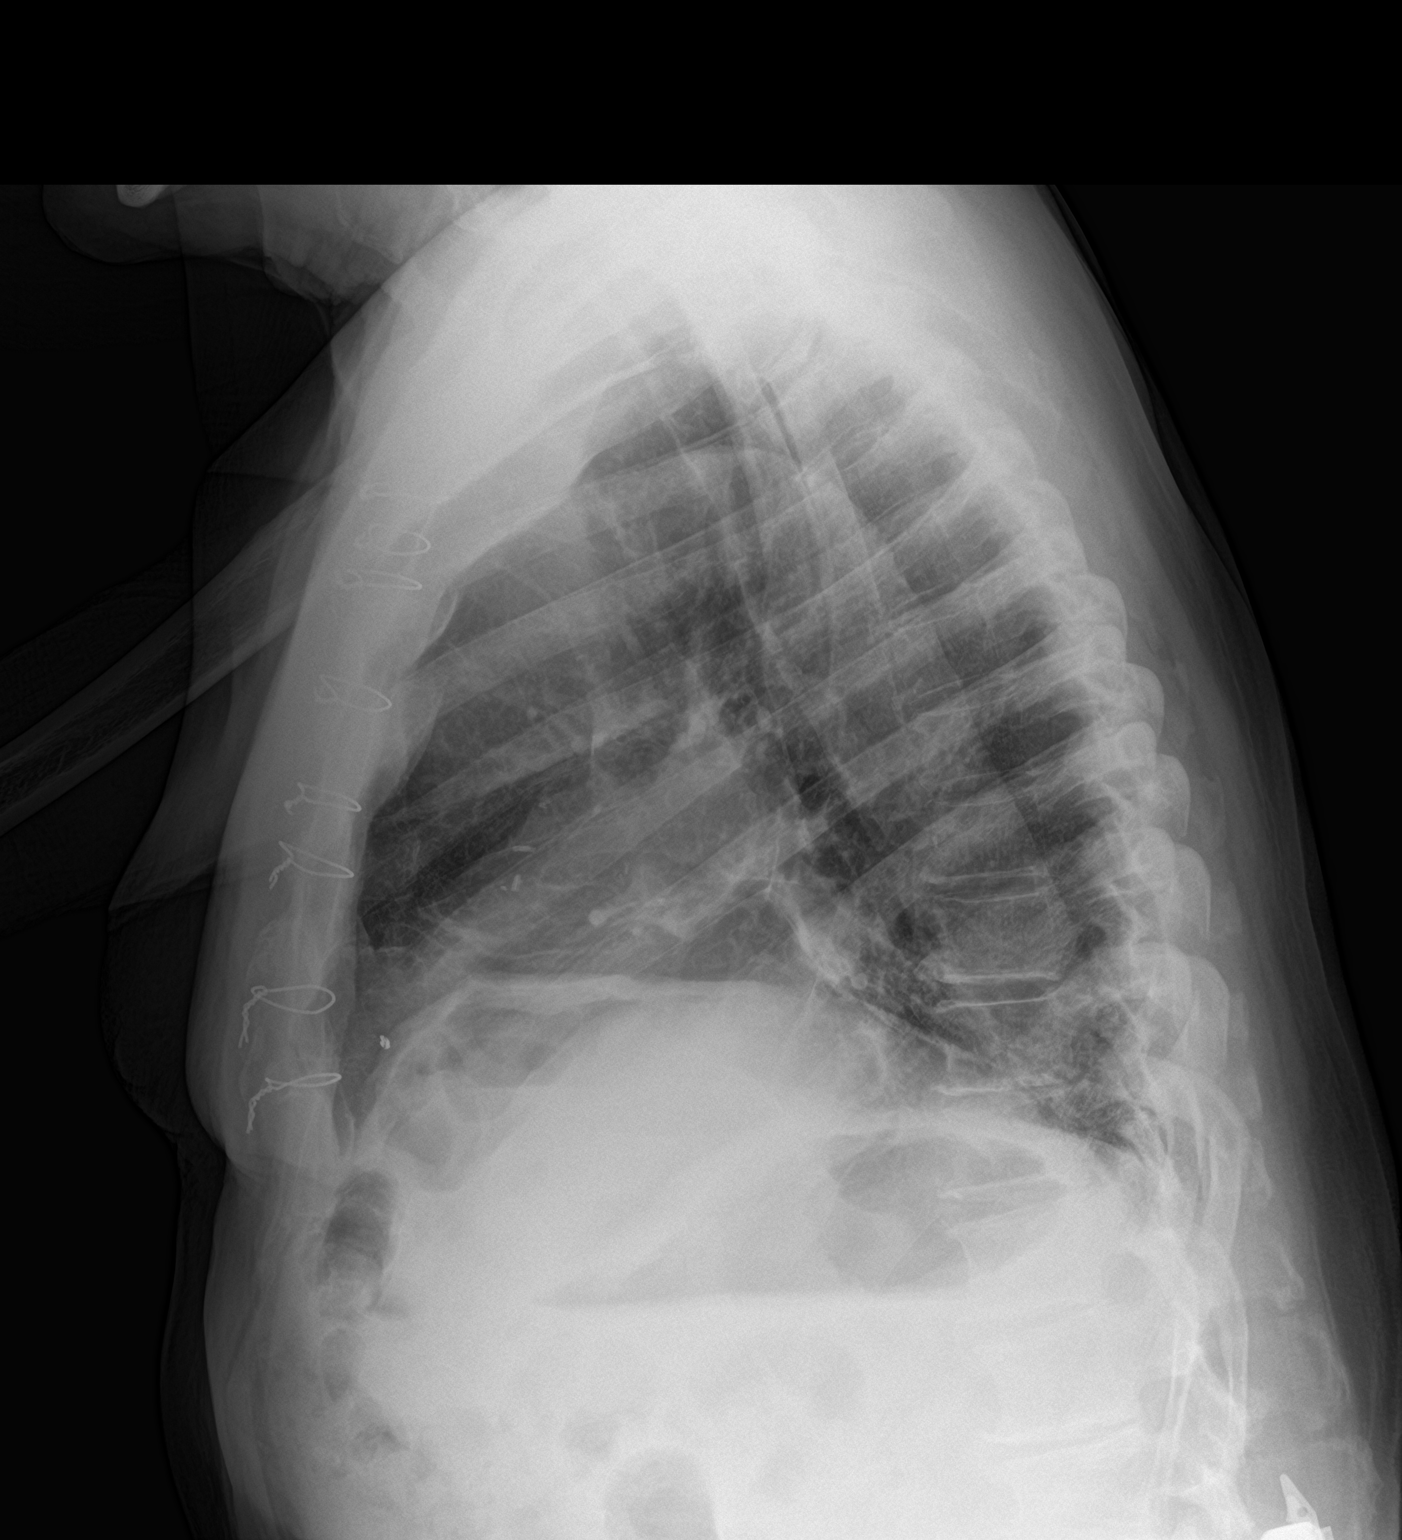

[2 of 2 positions shown; findings below may reference images not displayed]

FINDINGS: Prior median sternotomy and CABG. The heart size and mediastinal
contours are within normal limits. Similar elevation the right
hemidiaphragm. Right basilar subsegmental atelectasis. No pleural
effusion. No pneumothorax. Bilateral glenohumeral degenerative
change.
IMPRESSION: Right basilar subsegmental atelectasis.  No pleural effusion.

## 2022-02-25 ENCOUNTER — Ambulatory Visit (INDEPENDENT_AMBULATORY_CARE_PROVIDER_SITE_OTHER): Payer: Self-pay | Admitting: Neurology

## 2022-02-25 ENCOUNTER — Encounter: Payer: Self-pay | Admitting: Neurology

## 2022-02-25 VITALS — BP 108/70 | HR 73 | Ht 67.0 in | Wt 166.0 lb

## 2022-02-25 DIAGNOSIS — R200 Anesthesia of skin: Secondary | ICD-10-CM

## 2022-02-25 DIAGNOSIS — I4811 Longstanding persistent atrial fibrillation: Secondary | ICD-10-CM

## 2022-02-25 DIAGNOSIS — I63411 Cerebral infarction due to embolism of right middle cerebral artery: Secondary | ICD-10-CM

## 2022-02-25 MED ORDER — STUDY - LIBREXIA-AF - APIXABAN 5 MG OR PLACEBO CAPSULE (PI-SETHI): 5.0000 mg | ORAL_CAPSULE | Freq: Two times a day (BID) | ORAL | Status: DC

## 2022-02-25 MED ORDER — STUDY - LIBREXIA-AF - JNJ-70033093 (MILVEXIAN) 100 MG OR PLACEBO (PI-SETHI): 1.0000 | ORAL_TABLET | Freq: Two times a day (BID) | ORAL | Status: DC

## 2022-02-25 MED ORDER — STUDY - LIBREXIA-AF - APIXABAN 2.5 MG OR PLACEBO CAPSULE (PI-SETHI): 2.5000 mg | ORAL_CAPSULE | Freq: Two times a day (BID) | ORAL | Status: DC

## 2022-02-25 NOTE — Patient Instructions (Signed)
I had a long d/w patient about his recent stroke,atrial fibrillation, risk for recurrent stroke/TIAs, personally independently reviewed imaging studies and stroke evaluation results and answered questions.Continue Librexia AF study medication ( Eliquis versus Milvexian )   for secondary stroke prevention and maintain strict control of hypertension with blood pressure goal below 130/90, diabetes with hemoglobin A1c goal below 6.5% and lipids with LDL cholesterol goal below 70 mg/dL. I also advised the patient to eat a healthy diet with plenty of whole grains, cereals, fruits and vegetables, exercise regularly and maintain ideal body weight Followup in the future with me in 6 months or call earlier if needed.  Stroke Prevention Some medical conditions and behaviors can lead to a higher chance of having a stroke. You can help prevent a stroke by eating healthy, exercising, not smoking, and managing any medical conditions you have. Stroke is a leading cause of functional impairment. Primary prevention is particularly important because a majority of strokes are first-time events. Stroke changes the lives of not only those who experience a stroke but also their family and other caregivers. How can this condition affect me? A stroke is a medical emergency and should be treated right away. A stroke can lead to brain damage and can sometimes be life-threatening. If a person gets medical treatment right away, there is a better chance of surviving and recovering from a stroke. What can increase my risk? The following medical conditions may increase your risk of a stroke: Cardiovascular disease. High blood pressure (hypertension). Diabetes. High cholesterol. Sickle cell disease. Blood clotting disorders (hypercoagulable state). Obesity. Sleep disorders (obstructive sleep apnea). Other risk factors include: Being older than age 67. Having a history of blood clots, stroke, or mini-stroke (transient ischemic  attack, TIA). Genetic factors, such as race, ethnicity, or a family history of stroke. Smoking cigarettes or using other tobacco products. Taking birth control pills, especially if you also use tobacco. Heavy use of alcohol or drugs, especially cocaine and methamphetamine. Physical inactivity. What actions can I take to prevent this? Manage your health conditions High cholesterol levels. Eating a healthy diet is important for preventing high cholesterol. If cholesterol cannot be managed through diet alone, you may need to take medicines. Take any prescribed medicines to control your cholesterol as told by your health care provider. Hypertension. To reduce your risk of stroke, try to keep your blood pressure below 130/80. Eating a healthy diet and exercising regularly are important for controlling blood pressure. If these steps are not enough to manage your blood pressure, you may need to take medicines. Take any prescribed medicines to control hypertension as told by your health care provider. Ask your health care provider if you should monitor your blood pressure at home. Have your blood pressure checked every year, even if your blood pressure is normal. Blood pressure increases with age and some medical conditions. Diabetes. Eating a healthy diet and exercising regularly are important parts of managing your blood sugar (glucose). If your blood sugar cannot be managed through diet and exercise, you may need to take medicines. Take any prescribed medicines to control your diabetes as told by your health care provider. Get evaluated for obstructive sleep apnea. Talk to your health care provider about getting a sleep evaluation if you snore a lot or have excessive sleepiness. Make sure that any other medical conditions you have, such as atrial fibrillation or atherosclerosis, are managed. Nutrition Follow instructions from your health care provider about what to eat or drink to help manage your  health condition. These instructions may include: Reducing your daily calorie intake. Limiting how much salt (sodium) you use to 1,500 milligrams (mg) each day. Using only healthy fats for cooking, such as olive oil, canola oil, or sunflower oil. Eating healthy foods. You can do this by: Choosing foods that are high in fiber, such as whole grains, and fresh fruits and vegetables. Eating at least 5 servings of fruits and vegetables a day. Try to fill one-half of your plate with fruits and vegetables at each meal. Choosing lean protein foods, such as lean cuts of meat, poultry without skin, fish, tofu, beans, and nuts. Eating low-fat dairy products. Avoiding foods that are high in sodium. This can help lower blood pressure. Avoiding foods that have saturated fat, trans fat, and cholesterol. This can help prevent high cholesterol. Avoiding processed and prepared foods. Counting your daily carbohydrate intake.  Lifestyle If you drink alcohol: Limit how much you have to: 0-1 drink a day for women who are not pregnant. 0-2 drinks a day for men. Know how much alcohol is in your drink. In the [place] one drink equals one 12 oz bottle of beer (361m), one 5 oz glass of wine (1462m, or one 1 oz glass of hard liquor (4461m Do not use any products that contain nicotine or tobacco. These products include cigarettes, chewing tobacco, and vaping devices, such as e-cigarettes. If you need help quitting, ask your health care provider. Avoid secondhand smoke. Do not use drugs. Activity  Try to stay at a healthy weight. Get at least 30 minutes of exercise on most days, such as: Fast walking. Biking. Swimming. Medicines Take over-the-counter and prescription medicines only as told by your health care provider. Aspirin or blood thinners (antiplatelets or anticoagulants) may be recommended to reduce your risk of forming blood clots that can lead to stroke. Avoid taking birth control pills. Talk to your  health care provider about the risks of taking birth control pills if: You are over 35 69ars old. You smoke. You get very bad headaches. You have had a blood clot. Where to find more information American Stroke Association: www.strokeassociation.org Get help right away if: You or a loved one has any symptoms of a stroke. "BE FAST" is an easy way to remember the main warning signs of a stroke: B - Balance. Signs are dizziness, sudden trouble walking, or loss of balance. E - Eyes. Signs are trouble seeing or a sudden change in vision. F - Face. Signs are sudden weakness or numbness of the face, or the face or eyelid drooping on one side. A - Arms. Signs are weakness or numbness in an arm. This happens suddenly and usually on one side of the body. S - Speech. Signs are sudden trouble speaking, slurred speech, or trouble understanding what people say. T - Time. Time to call emergency services. Write down what time symptoms started. You or a loved one has other signs of a stroke, such as: A sudden, severe headache with no known cause. Nausea or vomiting. Seizure. These symptoms may represent a serious problem that is an emergency. Do not wait to see if the symptoms will go away. Get medical help right away. Call your local emergency services (911 in the [place] Do not drive yourself to the hospital. Summary You can help to prevent a stroke by eating healthy, exercising, not smoking, limiting alcohol intake, and managing any medical conditions you may have. Do not use any products that contain nicotine or tobacco. These include cigarettes,  chewing tobacco, and vaping devices, such as e-cigarettes. If you need help quitting, ask your health care provider. Remember "BE FAST" for warning signs of a stroke. Get help right away if you or a loved one has any of these signs. This information is not intended to replace advice given to you by your health care provider. Make sure you discuss any questions you  have with your health care provider. Document Revised: 10/29/2019 Document Reviewed: 10/29/2019 Elsevier Patient Education  [place].

## 2022-02-25 NOTE — Progress Notes (Signed)
[facility] [address] [place]. [place] [address] [phone]       LIBREXIA AF STUDY OFFICE FOLLOW-UP NOTE  Mr. Travis Brewer Date of Birth:  [birth year] Medical Record Number:  [id number]   HPI: Travis Brewer is a 87 year old pleasant Caucasian male seen today via initial office follow-up visit following hospital admission for stroke in September 2023.  He has past medical history of chronic atrial fibrillation who was not on anticoagulation in the past, congestive heart failure, remote DVT and bilateral lower extremities in January 2021, melanoma removal from left shoulder blade, coronary artery disease and benign prostatic hypertrophy.  He presented on 01/07/2022 with sudden onset of left facial droop and left face numbness as well as left arm.  NIH stroke scale was 4.  He met inclusion exclusion criteria for thrombolysis and was administered IV tenecteplase uneventfully.  He did well and had some mild residual left-sided numbness only.  MRI scan confirmed patchy right MCA small frontoparietal and subcortical white matter infarcts.  Few scattered chronic cerebral and cerebellar microhemorrhages were noted.  There are also chronic old infarcts involving the right greater than left cerebellum.  CT angiogram of the brain and neck did not show any large vessel stenosis or occlusion.  2D echo showed ejection fraction of 60 to 65%.  LDL cholesterol was 28 mg percent.  Hemoglobin A1c was 5.1.  Patient was started on Eliquis for anticoagulation and also offered possible participation in the [place] A-fib stroke prevention trial(Eliquis versus Melvexian).  Patient decided to participate in the study and was randomized to stop Eliquis and currently on study medication for the last 4 weeks.  He states he is doing well.  He still has a little bit of numbness in his left face and motor skills in his hand.  He has been doing regular exercises of the hand as well as face which seem to be helping.   Some mild bruising but no significant bleeding episodes.  He does have a right foot drop which is chronic from back surgery 20 years gait without assistive device.  He has not had any falls or injuries.  He has no new complaints.  ROS:   14 system review of systems is positive for bruising, numbness, tingling, right foot drop, gait difficulties all other systems negative  PMH:  Past Medical History:  Diagnosis Date   Acute on chronic respiratory failure with hypoxia (HCC)    BPH (benign prostatic hyperplasia)    CAD (coronary artery disease)    Cancer ([facility])    Melonoma on left shoulder blade removed   Cerebral amyloid angiopathy (HCC)    Chronic atrial fibrillation (HCC)    Chronic congestive heart failure with left ventricular diastolic dysfunction (HCC)    Diaphragmatic paralysis    Dyspnea    Dysrhythmia    ED (erectile dysfunction)    Hepatitis    Hep A   History of DVT of lower extremity    BLE DVT ~ 04/2019   Pneumonia    Stroke (cerebrum) ([facility]) 03/2019   STROKE X 2    Social History:  Social History   Socioeconomic History   Marital status: Married    Spouse name: Not on file   Number of children: Not on file   Years of education: Not on file   Highest education level: Not on file  Occupational History   Not on file  Tobacco Use   Smoking status: Never   Smokeless tobacco: Never  Vaping  Use   Vaping Use: Never used  Substance and Sexual Activity   Alcohol use: Not on file    Comment: nightly beer/ wine (2-3 drinks)   Drug use: Never   Sexual activity: Not on file  Other Topics Concern   Not on file  Social History Narrative   Right handed   Social Determinants of Health   Financial Resource Strain: Not on file  Food Insecurity: Not on file  Transportation Needs: Not on file  Physical Activity: Not on file  Stress: Not on file  Social Connections: Not on file  Intimate Partner Violence: Not on file    Medications:   Current Outpatient  Medications on File Prior to Visit  Medication Sig Dispense Refill   Ascorbic Acid (VITAMIN C) 500 MG CAPS Take 500 mg by mouth daily.      atorvastatin (LIPITOR) 80 MG tablet Take 1 tablet (80 mg total) by mouth daily. 90 tablet 3   finasteride (PROSCAR) 5 MG tablet Take 5 mg by mouth daily.     furosemide (LASIX) 20 MG tablet Take 1 tablet (20 mg total) by mouth daily. May also take an additional tablet as needed for swelling. 90 tablet 3   loratadine (CLARITIN) 10 MG tablet Take 10 mg by mouth daily as needed for allergies.     melatonin 5 MG TABS Take 5 mg by mouth.     metoprolol tartrate (LOPRESSOR) 25 MG tablet Take 25 mg by mouth 2 (two) times daily.      Multiple Vitamins-Minerals (MULTIVITAMIN ADULT EXTRA C PO) Take 1 tablet by mouth daily.     Multiple Vitamins-Minerals (PRESERVISION AREDS 2 PO) Take 1 tablet by mouth 2 (two) times daily.     polyvinyl alcohol (LIQUIFILM TEARS) 1.4 % ophthalmic solution Place 1 drop into both eyes as needed for dry eyes. 15 mL 0   tadalafil (CIALIS) 5 MG tablet Take 5 mg by mouth daily.     No current facility-administered medications on file prior to visit.    Allergies:   Allergies  Allergen Reactions   Quetiapine Nausea And Vomiting    When taken with clonazepam (seprately they are fine)   Clonazepam Nausea And Vomiting    When taken with quetiapine (seprately they are fine)    Physical Exam General: well developed, well nourished pleasant elderly Caucasian male, seated, in no evident distress Head: head normocephalic and atraumatic.  Neck: supple with no carotid or supraclavicular bruits Cardiovascular: regular rate and rhythm, no murmurs Musculoskeletal: no deformity Skin:  no rash/petichiae Vascular:  Normal pulses all extremities Vitals:   02/25/22 1042  BP: 108/70  Pulse: 73   Neurologic Exam Mental Status: Awake and fully alert. Oriented to place and time. Recent and remote memory intact. Attention span, concentration and  fund of knowledge appropriate. Mood and affect appropriate.  Cranial Nerves: Fundoscopic exam reveals sharp disc margins. Pupils equal, briskly reactive to light. Extraocular movements full without nystagmus. Visual fields full to confrontation. Hearing intact. Facial sensation intact. Face, tongue, palate moves normally and symmetrically.  Motor: Normal bulk and tone. Normal strength in all tested extremity muscles.  Right foot drop which is chronic. Sensory.: intact  .position,vibratory sensation.  Subjective paresthesia of the left fingertips on the left hand but no objective sensory loss remains sensation in the right foot from ankle down this is chronic from withdrawal Coordination: Rapid alternating movements normal in all extremities. Finger-to-nose and heel-to-shin performed accurately bilaterally. Gait and Station: Arises from chair without  difficulty. Stance is normal.  Walks with a right foot drop.  Gait demonstrates normal stride length and balance . Able to heel, toe and tandem walk with mild difficulty.  Reflexes: 1+ and symmetric. Toes downgoing.   NIHSS  0 Modified Rankin  1   ASSESSMENT: 87 year old Caucasian male with embolic right hemispheric infarcts s/p IV TNK administration, atrial fibrillation not on anticoagulation September 2023.  Patient is doing well with near complete recovery and is participating in the [place] AF study  ( Eliquis versus [place] )   for secondary stroke prevention      PLAN:I had a long d/w patient about his recent stroke,atrial fibrillation, risk for recurrent stroke/TIAs, personally independently reviewed imaging studies and stroke evaluation results and answered questions.Continue Librexia AF study medication ( Eliquis versus Milvexian )  for secondary stroke prevention and maintain strict control of hypertension with blood pressure goal below 130/90, diabetes with hemoglobin A1c goal below 6.5% and lipids with LDL cholesterol goal below 70 mg/dL.  I also advised the patient to eat a healthy diet with plenty of whole grains, cereals, fruits and vegetables, exercise regularly and maintain ideal body weight Followup in the future with me in 6 months or call earlier if needed. Greater than 50% of time during this 35 minute visit was spent on counseling,explanation of diagnosis of embolic stroke and atrial fibrillation, planning of further management, discussion with patient and family and coordination of care [name], MD Note: This document was prepared with digital dictation and possible smart phrase technology. Any transcriptional errors that result from this process are unintentional

## 2022-04-19 ENCOUNTER — Other Ambulatory Visit: Payer: Self-pay

## 2022-04-19 NOTE — Patient Outreach (Signed)
First telephone outreach attempt to obtain mRS. No answer. Left message for returned call.  Varie Machamer [facility]-Care Management Assistant [phone]  

## 2022-04-20 ENCOUNTER — Other Ambulatory Visit: Payer: Self-pay

## 2022-04-20 NOTE — Patient Outreach (Signed)
Telephone outreach to patient to obtain mRS was successfully completed. MRS=1  Ceciley Buist [facility] Care Management Assistant [phone]  

## 2022-06-01 ENCOUNTER — Encounter (HOSPITAL_BASED_OUTPATIENT_CLINIC_OR_DEPARTMENT_OTHER): Payer: Self-pay | Admitting: Internal Medicine

## 2022-06-01 ENCOUNTER — Telehealth: Payer: Self-pay | Admitting: Internal Medicine

## 2022-06-01 NOTE — Telephone Encounter (Signed)
Pt is calling to get advice as to a geriatric doctor he would like to be referred to.

## 2022-06-01 NOTE — Telephone Encounter (Signed)
Pt requested a referral to a geriatric PCP physician.  You may answer him on MyChart.

## 2022-06-02 NOTE — Telephone Encounter (Signed)
MD sent message to patient with recommendations

## 2022-07-01 ENCOUNTER — Ambulatory Visit: Payer: Medicare Other | Admitting: Orthopedic Surgery

## 2022-07-05 ENCOUNTER — Other Ambulatory Visit: Payer: Self-pay

## 2022-07-05 MED ORDER — METOPROLOL TARTRATE 25 MG PO TABS: 25.0000 mg | ORAL_TABLET | Freq: Two times a day (BID) | ORAL | 3 refills | Status: DC

## 2022-07-08 ENCOUNTER — Encounter: Payer: Self-pay | Admitting: Orthopedic Surgery

## 2022-07-08 ENCOUNTER — Ambulatory Visit (INDEPENDENT_AMBULATORY_CARE_PROVIDER_SITE_OTHER): Payer: Medicare Other | Admitting: Orthopedic Surgery

## 2022-07-08 VITALS — BP 132/82 | HR 84 | Temp 97.3°F | Resp 16 | Ht 67.0 in | Wt 165.6 lb

## 2022-07-08 DIAGNOSIS — I4819 Other persistent atrial fibrillation: Secondary | ICD-10-CM

## 2022-07-08 DIAGNOSIS — G5791 Unspecified mononeuropathy of right lower limb: Secondary | ICD-10-CM

## 2022-07-08 DIAGNOSIS — Z85828 Personal history of other malignant neoplasm of skin: Secondary | ICD-10-CM

## 2022-07-08 DIAGNOSIS — I25810 Atherosclerosis of coronary artery bypass graft(s) without angina pectoris: Secondary | ICD-10-CM

## 2022-07-08 DIAGNOSIS — I5032 Chronic diastolic (congestive) heart failure: Secondary | ICD-10-CM

## 2022-07-08 DIAGNOSIS — Z8673 Personal history of transient ischemic attack (TIA), and cerebral infarction without residual deficits: Secondary | ICD-10-CM | POA: Diagnosis not present

## 2022-07-08 DIAGNOSIS — H353132 Nonexudative age-related macular degeneration, bilateral, intermediate dry stage: Secondary | ICD-10-CM

## 2022-07-08 DIAGNOSIS — R350 Frequency of micturition: Secondary | ICD-10-CM

## 2022-07-08 DIAGNOSIS — N401 Enlarged prostate with lower urinary tract symptoms: Secondary | ICD-10-CM

## 2022-07-08 NOTE — Progress Notes (Signed)
Careteam: Brewer Care Team: Travis Brewer, [place] as PCP - General (Physician Assistant) Pixie Casino, MD as PCP - Cardiology (Cardiology) Reino Kent, MD as Surgeon (General Surgery)  Seen by: Windell Moulding, AGNP-C  PLACE OF SERVICE:  [place] Directive information    Allergies  Allergen Reactions   Quetiapine Nausea And Vomiting    When taken with clonazepam (seprately they are fine)   Clonazepam Nausea And Vomiting    When taken with quetiapine (seprately they are fine)    Chief Complaint  Brewer presents with   Travis Brewer.      HPI: Brewer is a 88 y.o. male seen today to establish with Travis Brewer.   Previous PCP Travis Brewer. He wanted geriatric physician. Retired. 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 Past exposure to agent orange. Married.   Stroke 12/2021. Presented to ED with left facial droop, left facial numbness and left arm numbness. MRI confirmed right MCA small frontoparietal and subcortical white matter infarcts, scattered micro hemorrhages also noted. Received TNK. Followed by neurology. He is enrolled in Travis Brewer (Eliquis versus Melvexian).   Persistent atrial fibrillation- see above, began after 02/2019 CAGB  CAD- Total cholesterol 92, LDL 28, remains on high dose atorvastatin  CHF- 2D echo LVEF 60-65% 01/07/2022, remains on furosemide  Malignant melanoma- removed from left shoulder blade  BPH- [facility], remains on finasteride and cialis  Right foot neuropathy- from past back surgery  Dry macular degeneration> followed by Dr. Zadie Rhine plans to start injections to both eyes soon, L>R  Past surgeries:  Aortic aneurysm repair  02/2019  2 failed watchman attempts  CABG x 2 02/2019  Inguinal hernia repair- 11/2019  Left atrial appendage occlusion- 05/2020  PEG tube- removed at this time  Past bilateral  knee replacements  Past back surgery to L3 and L4  Family history:  Mother> deceased> macular degeneration  Father> deceased> stroke  Brother> deceased> diabetes    Smoking: never smoked, ex wife smoked  Alcohol use: 1-2 glasses of wine per night, no past abuse Illicit drug use: no past use or abuse  Diet: eats 2 meals daily, no particular dietary precautions  Exercise: works out 3 days a week, stationary bike and [employer]  Dentist: 6 months ago, next appointment in 08/2022 Eye exam: completed within past year  Advanced directives: copy on file     Review of Systems:  Review of Systems  Constitutional:  Negative for fever and malaise/fatigue.  HENT:  Positive for hearing loss.   Eyes:  Negative for blurred vision.  Respiratory:  Negative for cough, shortness of breath and wheezing.   Cardiovascular:  Negative for chest pain and leg swelling.  Gastrointestinal:  Negative for abdominal pain and blood in stool.  Genitourinary:  Negative for dysuria.  Musculoskeletal:  Positive for joint pain. Negative for falls.  Skin:  Negative for rash.  Neurological:  Negative for dizziness, weakness and headaches.  Psychiatric/Behavioral:  Negative for depression. The Brewer is not nervous/anxious.     Past Medical History:  Diagnosis Date   Acute on chronic respiratory failure with hypoxia (HCC)    BPH (benign prostatic hyperplasia)    CAD (coronary artery disease)    Cancer ([place])    Melonoma on left shoulder blade removed   Cerebral amyloid angiopathy (HCC)    Chronic atrial fibrillation ([place])  Chronic congestive heart failure with left ventricular diastolic dysfunction (HCC)    Diaphragmatic paralysis    Dyspnea    Dysrhythmia    ED (erectile dysfunction)    Hepatitis    Hep A   History of DVT of lower extremity    BLE DVT ~ 04/2019   Pneumonia    Stroke (cerebrum) ([place]) 03/2019   STROKE X 2   Past Surgical History:  Procedure Laterality Date   ASCENDING AORTIC  ANEURYSM REPAIR  02/22/2019   32 mm Hemashild graft, STJ to ascending interposition graft, resuspension of AV (Travis Brewer)   BACK SURGERY     CARDIAC CATHETERIZATION  02/15/2019   Travis Brewer   CORONARY ARTERY BYPASS GRAFT  02/22/2019   LIMA to LAD, SVG to D1 [facility])   EYE SURGERY Bilateral    Cataract surgery   INGUINAL HERNIA REPAIR Right 11/14/2019   Procedure: RIGHT INGUINAL HERNIA REPAIR WITH MESH;  Surgeon: Donnie Mesa, MD;  Location: [place];  Service: General;  Laterality: Right;  LMA AND TAP BLOCK   JOINT REPLACEMENT Bilateral    knee replacement   L3-L4 Back surgery     LEFT ATRIAL APPENDAGE OCCLUSION N/A 05/29/2020   Procedure: LEFT ATRIAL APPENDAGE OCCLUSION;  Surgeon: Sherren Mocha, MD;  Location: [facility];  Service: Cardiovascular;  Laterality: N/A;   PEG TUBE PLACEMENT     sciatic nerve cyst removal     TEE WITHOUT CARDIOVERSION N/A 05/29/2020   Procedure: TRANSESOPHAGEAL ECHOCARDIOGRAM (TEE);  Surgeon: Sherren Mocha, MD;  Location: [facility];  Service: Cardiovascular;  Laterality: N/A;   TONSILLECTOMY     TRACHEOSTOMY     Social History:   reports that he has never smoked. He has never used smokeless tobacco. He reports that he does not use drugs. No history on file for alcohol use.  Family History  Problem Relation Age of Onset   Diabetes Mother    Stroke Father     Medications: Brewer's Medications  New Prescriptions   No medications on file  Previous Medications   ASCORBIC ACID (VITAMIN C) 500 MG CAPS    Take 500 mg by mouth daily.    ATORVASTATIN (LIPITOR) 80 MG TABLET    Take 1 tablet (80 mg total) by mouth daily.   FINASTERIDE (PROSCAR) 5 MG TABLET    Take 5 mg by mouth daily.   FUROSEMIDE (LASIX) 20 MG TABLET    Take 1 tablet (20 mg total) by mouth daily. May also take an additional tablet as needed for swelling.   LORATADINE (CLARITIN) 10 MG TABLET    Take 10 mg by mouth daily as needed for  allergies.   MELATONIN 5 MG TABS    Take 5 mg by mouth.   METOPROLOL TARTRATE (LOPRESSOR) 25 MG TABLET    Take 1 tablet (25 mg total) by mouth 2 (two) times daily.   MULTIPLE VITAMINS-MINERALS (MULTIVITAMIN ADULT EXTRA C PO)    Take 1 tablet by mouth daily.   MULTIPLE VITAMINS-MINERALS (PRESERVISION AREDS 2 PO)    Take 1 tablet by mouth 2 (two) times daily.   POLYVINYL ALCOHOL (LIQUIFILM TEARS) 1.4 % OPHTHALMIC SOLUTION    Place 1 drop into both eyes as needed for dry eyes.   TADALAFIL (CIALIS) 5 MG TABLET    Take 5 mg by mouth daily.  Modified Medications   No medications on file  Discontinued Medications   No medications on file    Physical Exam:  There were no vitals filed for this visit. There is no height or weight on file to calculate BMI. Wt Readings from Last 3 Encounters:  02/25/22 166 lb (75.3 kg)  01/13/22 164 lb 6.4 oz (74.6 kg)  01/07/22 171 lb 1.2 oz (77.6 kg)    Physical Exam Vitals reviewed.  Constitutional:      General: He is not in acute distress. HENT:     Head: Normocephalic.     Ears:     Comments: Bilateral hearing aids Eyes:     General:        Right eye: No discharge.        Left eye: No discharge.  Cardiovascular:     Rate and Rhythm: Normal rate. Rhythm irregular.     Pulses: Normal pulses.     Heart sounds: Normal heart sounds.  Pulmonary:     Effort: Pulmonary effort is normal. No respiratory distress.     Breath sounds: Normal breath sounds. No wheezing.  Abdominal:     General: Bowel sounds are normal. There is no distension.     Palpations: Abdomen is soft.     Tenderness: There is no abdominal tenderness.  Musculoskeletal:     Cervical back: Neck supple.     Right lower leg: No edema.     Left lower leg: No edema.  Skin:    General: Skin is warm and dry.     Capillary Refill: Capillary refill takes less than 2 seconds.  Neurological:     General: No focal deficit present.     Mental Status: He is alert and oriented to person,  place, and time.     Motor: No weakness.     Gait: Gait normal.  Psychiatric:        Mood and Affect: Mood normal.        Behavior: Behavior normal.     Labs reviewed: Basic Metabolic Panel: Recent Labs    01/07/22 0241 01/07/22 0248  NA 136 137  K 4.2 4.1  CL 104 105  CO2 22  --   GLUCOSE 100* 95  BUN 20 21  CREATININE 0.96 0.90  CALCIUM 9.3  --    Liver Function Tests: Recent Labs    01/07/22 0241  AST 34  ALT 28  ALKPHOS 83  BILITOT 0.7  PROT 6.3*  ALBUMIN 3.9   No results for input(s): "LIPASE", "AMYLASE" in the last 8760 hours. No results for input(s): "AMMONIA" in the last 8760 hours. CBC: Recent Labs    01/07/22 0241 01/07/22 0248  WBC 6.6  --   NEUTROABS 3.0  --   HGB 14.7 14.3  HCT 44.3 42.0  MCV 103.0*  --   PLT 147*  --    Lipid Panel: Recent Labs    01/07/22 0241  CHOL 92  HDL 58  LDLCALC 28  TRIG 32  CHOLHDL 1.6   TSH: No results for input(s): "TSH" in the last 8760 hours. A1C: Brewer Results  Component Value Date   HGBA1C 5.1 01/07/2022     Assessment/Plan: 1. History of CVA (cerebrovascular accident) - followed by neurology - Stroke 12/2021> presented to ED with left facial droop, left facial numbness and left arm numbness> resolved with TNK - MRI confirmed right MCA small frontoparietal and subcortical white matter infarcts, scattered micro hemorrhages also noted - remains in [facility] AF study  - cont statin  2. Persistent atrial fibrillation ([place]) - followed by cardiology - HR< 100 with metoprolol - remains  in [place] AF study  3. Coronary artery disease involving coronary bypass graft of native heart without angina pectoris - see above - cont statin  4. Chronic congestive heart failure with left ventricular diastolic dysfunction (HCC) - compensated - cont furosemide  5. History of skin cancer - involved left shoulder - discuss dermatology specialist in future  6. Benign prostatic hyperplasia with urinary  frequency - followed y [facility] - cont finasteride and cialis  7. Neuropathy of foot, right - ongoing - not on medication  8. Intermediate stage nonexudative age-related macular degeneration of both eyes - followed by Dr. Zadie Rhine - L>R - about to start injections   Future labs/tests: MMSE  Total time: 55 minutes. Greater than 50% of total time spent doing Brewer education regarding health maintenance, PAF, CAD, stroke prevention, neuropathy, and BPH including symptom/medication management.    Next appt: 08/05/2022  Windell Moulding, [facility] [phone]

## 2022-07-15 ENCOUNTER — Other Ambulatory Visit: Payer: Self-pay | Admitting: *Deleted

## 2022-07-15 MED ORDER — ATORVASTATIN CALCIUM 80 MG PO TABS: 80.0000 mg | ORAL_TABLET | Freq: Every day | ORAL | 2 refills | Status: DC

## 2022-08-05 ENCOUNTER — Ambulatory Visit (INDEPENDENT_AMBULATORY_CARE_PROVIDER_SITE_OTHER): Payer: Medicare Other | Admitting: Orthopedic Surgery

## 2022-08-05 ENCOUNTER — Encounter: Payer: Self-pay | Admitting: Orthopedic Surgery

## 2022-08-05 VITALS — BP 120/82 | HR 78 | Temp 95.7°F | Resp 16 | Ht 67.0 in | Wt 165.4 lb

## 2022-08-05 DIAGNOSIS — R413 Other amnesia: Secondary | ICD-10-CM

## 2022-08-05 DIAGNOSIS — I5032 Chronic diastolic (congestive) heart failure: Secondary | ICD-10-CM

## 2022-08-05 DIAGNOSIS — N401 Enlarged prostate with lower urinary tract symptoms: Secondary | ICD-10-CM

## 2022-08-05 DIAGNOSIS — R058 Other specified cough: Secondary | ICD-10-CM

## 2022-08-05 DIAGNOSIS — I25810 Atherosclerosis of coronary artery bypass graft(s) without angina pectoris: Secondary | ICD-10-CM

## 2022-08-05 DIAGNOSIS — Z8673 Personal history of transient ischemic attack (TIA), and cerebral infarction without residual deficits: Secondary | ICD-10-CM | POA: Diagnosis not present

## 2022-08-05 DIAGNOSIS — I4819 Other persistent atrial fibrillation: Secondary | ICD-10-CM

## 2022-08-05 DIAGNOSIS — R350 Frequency of micturition: Secondary | ICD-10-CM

## 2022-08-05 MED ORDER — BENZONATATE 200 MG PO CAPS: 200.0000 mg | ORAL_CAPSULE | Freq: Two times a day (BID) | ORAL | 0 refills | Status: DC | PRN

## 2022-08-05 NOTE — Progress Notes (Signed)
Careteam: Patient Care Team: Octavia Heir, NP as PCP - General (Adult Health Nurse Practitioner) [name], MD as PCP - Cardiology (Cardiology) [name], MD as Surgeon (General Surgery) [name] (Dermatology) [name], MD as Consulting Physician (Ophthalmology) [name], MD as Consulting Physician (Neurology) [name], MD as Consulting Physician (Urology)  Seen by: [name], AGNP-C  PLACE OF SERVICE:  [facility]  Advanced Directive information Does Patient Have a Medical Advance Directive?: Yes, Type of Advance Directive: Healthcare Power of [place];Living will, Does patient want to make changes to medical advance directive?: No - Patient declined  Allergies  Allergen Reactions   Quetiapine Nausea And Vomiting    When taken with clonazepam (seprately they are fine)   Clonazepam Nausea And Vomiting    When taken with quetiapine (seprately they are fine)    Chief Complaint  Patient presents with   Follow-up    4 week follow up.      HPI: Patient is a 88 y.o. male seen today for medical management of chronic condition.    Wife present during encounter.   Productive cough in AM. Occurs when he wakes up. He produces small amount of phlegm. Has not tired any interventions. Afebrile. Vitals stable. Denies chest pain or sob.   Dr. Jillyn [place]>injections to both eyes every 6 weeks due to macular degeneration. Records requested.   MMSE 23/30, incorrect shape, correct clock and sentence. No behaviors or memory issues at this time.   No acute concerns today.   BP controlled.   Weights stable.   No recent falls or injuries.   Discussed scheduling AWV.   Review of Systems:  Review of Systems  Constitutional:  Negative for chills and fever.  HENT:  Positive for hearing loss. Negative for congestion.   Eyes:  Negative for blurred vision.  Respiratory:  Positive for cough and sputum production. Negative for  shortness of breath and wheezing.   Cardiovascular:  Negative for chest pain and leg swelling.  Gastrointestinal:  Negative for abdominal pain and blood in stool.  Genitourinary:  Positive for frequency. Negative for dysuria.  Musculoskeletal:  Negative for falls and joint pain.  Skin: Negative.   Neurological:  Positive for sensory change. Negative for dizziness, weakness and headaches.  Psychiatric/Behavioral:  Negative for depression. The patient is not nervous/anxious and does not have insomnia.     Past Medical History:  Diagnosis Date   Acute on chronic respiratory failure with hypoxia    Bone spur    Per [facility] new patient packet   BPH (benign prostatic hyperplasia)    CAD (coronary artery disease)    Cancer    Melonoma on left shoulder blade removed   Cerebral amyloid angiopathy    Chronic atrial fibrillation    Chronic congestive heart failure with left ventricular diastolic dysfunction    Diaphragmatic paralysis    Dyspnea    Dysrhythmia    ED (erectile dysfunction)    Foot drop    Per [facility] new patient packet   Ganglion cyst    Per [facility] new patient packet   Hepatitis    Hep A   History of DVT of lower extremity    BLE DVT ~ 04/2019   Hyperplasia of prostate    Per [facility] new patient packet   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, Per [facility] new patient packet   Pneumonia    Stroke (cerebrum) 03/2019   STROKE  X 2   Past Surgical History:  Procedure Laterality Date   ASCENDING AORTIC ANEURYSM REPAIR  02/22/2019   32 mm Hemashild graft, STJ to ascending interposition graft, resuspension of AV ([place])   BACK SURGERY     CARDIAC CATHETERIZATION  02/15/2019   [facility]   CORONARY ARTERY BYPASS GRAFT  02/22/2019   LIMA to LAD, SVG to D1 [facility])   EYE SURGERY Bilateral    Cataract surgery   INGUINAL HERNIA REPAIR Right 11/14/2019   Procedure: RIGHT INGUINAL HERNIA REPAIR WITH MESH;  Surgeon: [name], MD;  Location: [facility];  Service:  General;  Laterality: Right;  LMA AND TAP BLOCK   JOINT REPLACEMENT Bilateral    knee replacement   L3-L4 Back surgery     LEFT ATRIAL APPENDAGE OCCLUSION N/A 05/29/2020   Procedure: LEFT ATRIAL APPENDAGE OCCLUSION;  Surgeon: [name], MD;  [facility]: [facility] INVASIVE CV LAB;  Service: Cardiovascular;  Laterality: N/A;   PEG TUBE PLACEMENT     sciatic nerve cyst removal     TEE WITHOUT CARDIOVERSION N/A 05/29/2020   Procedure: TRANSESOPHAGEAL ECHOCARDIOGRAM (TEE);  Surgeon: [name], MD;  Location:  [facility] INVASIVE CV LAB;  Service: Cardiovascular;  Laterality: N/A;   TONSILLECTOMY     TRACHEOSTOMY     Social History:   reports that he has never smoked. He has never used smokeless tobacco. He reports current alcohol use of about 1.0 - 2.0 standard drink of alcohol per week. He reports that he does not use drugs.  Family History  Problem Relation Age of Onset   Diabetes Mother    Dementia Mother        Per [facility] new patient packet   Stroke Father    Diabetes Brother        Per [facility] new patient packet    Medications: Patient's Medications  New Prescriptions   No medications on file  Previous Medications   ASCORBIC ACID (VITAMIN C) 1000 MG TABLET    Take 1,000 mg by mouth daily.   ATORVASTATIN (LIPITOR) 80 MG TABLET    Take 1 tablet (80 mg total) by mouth daily.   CARBOXYMETHYLCELLULOSE SOD PF 0.5 % SOLN    Place 1 drop into both eyes in the morning and at bedtime.   FINASTERIDE (PROSCAR) 5 MG TABLET    Take 5 mg by mouth daily.   FUROSEMIDE (LASIX) 20 MG TABLET    Take 1 tablet (20 mg total) by mouth daily. May also take an additional tablet as needed for swelling.   LORATADINE (CLARITIN) 10 MG TABLET    Take 10 mg by mouth daily as needed for allergies.   MELATONIN 5 MG TABS    Take 5 mg by mouth.   METOPROLOL TARTRATE (LOPRESSOR) 25 MG TABLET    Take 1 tablet (25 mg total) by mouth 2 (two) times daily.   MULTIPLE VITAMIN (MULTIVITAMIN ADULT PO)    Take 1 tablet by mouth daily.  MATURE KIRKLAND   MULTIPLE VITAMINS-MINERALS (PRESERVISION AREDS 2 PO)    Take 1 tablet by mouth 2 (two) times daily.   PEGCETACOPLAN, OPHTHALMIC, (SYFOVRE) 15 MG/0.1ML SOLN    by Intravitreal route as directed. 1 injection into the eyes Every 6 weeks.   POLYVINYL ALCOHOL (LIQUIFILM TEARS) 1.4 % OPHTHALMIC SOLUTION    Place 1 drop into both eyes as needed for dry eyes.   TADALAFIL (CIALIS) 5 MG TABLET    Take 5 mg by mouth daily.  Modified  Medications   No medications on file  Discontinued Medications   No medications on file    Physical Exam:  Vitals:   08/05/22 1306  BP: 120/82  Pulse: 78  Resp: 16  Temp: (!) 95.7 F (35.4 C)  SpO2: 95%  Weight: 165 lb 6.4 oz (75 kg)  Height: 5\' 7"  (1.702 m)   Body mass index is 25.91 kg/m. Wt Readings from Last 3 Encounters:  08/05/22 165 lb 6.4 oz (75 kg)  07/08/22 165 lb 9.6 oz (75.1 kg)  02/25/22 166 lb (75.3 kg)    Physical Exam Vitals reviewed.  Constitutional:      General: He is not in acute distress. HENT:     Head: Normocephalic.     Right Ear: There is no impacted cerumen.     Left Ear: There is no impacted cerumen.     Ears:     Comments: Bilateral hearing aids    Nose: Nose normal.     Mouth/Throat:     Mouth: Mucous membranes are moist.  Eyes:     General:        Right eye: No discharge.        Left eye: No discharge.  Cardiovascular:     Rate and Rhythm: Normal rate. Rhythm irregular.     Pulses: Normal pulses.     Heart sounds: Normal heart sounds.  Pulmonary:     Effort: Pulmonary effort is normal. No respiratory distress.     Breath sounds: Normal breath sounds. No wheezing, rhonchi or rales.  Abdominal:     General: Bowel sounds are normal. There is no distension.     Palpations: Abdomen is soft.     Tenderness: There is no abdominal tenderness.  Musculoskeletal:     Cervical back: Neck supple.     Right lower leg: No edema.     Left lower leg: No edema.  Skin:    General: Skin is warm and dry.      Capillary Refill: Capillary refill takes less than 2 seconds.  Neurological:     General: No focal deficit present.     Mental Status: He is alert and oriented to person, place, and time.     Motor: No weakness.     Gait: Gait normal.  Psychiatric:        Mood and Affect: Mood normal.        Behavior: Behavior normal.     Labs reviewed: Basic Metabolic Panel: Recent Labs    01/07/22 0241 01/07/22 0248  NA 136 137  K 4.2 4.1  CL 104 105  CO2 22  --   GLUCOSE 100* 95  BUN 20 21  CREATININE 0.96 0.90  CALCIUM 9.3  --    Liver Function Tests: Recent Labs    01/07/22 0241  AST 34  ALT 28  ALKPHOS 83  BILITOT 0.7  PROT 6.3*  ALBUMIN 3.9   No results for input(s): "LIPASE", "AMYLASE" in the last 8760 hours. No results for input(s): "AMMONIA" in the last 8760 hours. CBC: Recent Labs    01/07/22 0241 01/07/22 0248  WBC 6.6  --   NEUTROABS 3.0  --   HGB 14.7 14.3  HCT 44.3 42.0  MCV 103.0*  --   PLT 147*  --    Lipid Panel: Recent Labs    01/07/22 0241  CHOL 92  HDL 58  LDLCALC 28  TRIG 32  CHOLHDL 1.6   TSH: No results for input(s): "TSH" in  the last 8760 hours. A1C: Lab Results  Component Value Date   HGBA1C 5.1 01/07/2022     Assessment/Plan 1. Productive cough - increased cough in AM with intermittent sputum production - lung sounds clear - no cold symptoms - trial tessalon pearls - may also consider mucinex prn to thin secretions - benzonatate (TESSALON) 200 MG capsule; Take 1 capsule (200 mg total) by mouth 2 (two) times daily as needed for cough.  Dispense: 40 capsule; Refill: 0  2. History of CVA (cerebrovascular accident) - followed by neurology - 12/2021 MRI confirmed right MCA small frontoparietal and subcortical white matter infarcts, scattered micro hemorrhages - in [place] AF study - cont statin  3. Persistent atrial fibrillation (HCC) - followed by cardiology - HR< 100 with metoprolol - CBC with Differential/Platelet -  Complete Metabolic Panel with eGFR - TSH> 3.31 08/06/2022  4. Coronary artery disease involving coronary bypass graft of native heart without angina pectoris - cont statin - Lipid panel- future  5. Chronic congestive heart failure with left ventricular diastolic dysfunction (HCC) - compensated - 01/07/2022 2D echo LVEF 60-65% - cont furosemide   6. Benign prostatic hyperplasia with urinary frequency - followed by [facility] - cont finasteride and tadalafil  7. Memory impairment - MMSE 23/30, incorrect shapes, correct sentence/clock - 09/29 CT head noted periventricular white matter changes/ likely sequela of chronic small vessel ischemic disease  - no behaviors or impairment at this time   Total time: 32 minutes. Greater than 50% of total time spent doing patient education regarding health maintenance, cough, PAF, and BPH including symptom/medication management.    Next appt:Visit date not found  Wilmarie Sparlin Coletta Memos, [facility]  [facility] [phone]

## 2022-08-05 NOTE — Patient Instructions (Addendum)
Please schedule medicare annual wellness visit> video visit if you can  May also try mucinex for cough  Try humidifier at night for cough  May try Xyzal for antihistamine

## 2022-08-06 LAB — CBC WITH DIFFERENTIAL/PLATELET
Absolute Monocytes: 736 cells/uL (ref 200–950)
Basophils Absolute: 32 cells/uL (ref 0–200)
Basophils Relative: 0.4 %
Eosinophils Absolute: 128 cells/uL (ref 15–500)
Eosinophils Relative: 1.6 %
HCT: 42.9 % (ref 38.5–50.0)
Hemoglobin: 14.3 g/dL (ref 13.2–17.1)
Lymphs Abs: 1576 cells/uL (ref 850–3900)
MCH: 33.4 pg — ABNORMAL HIGH (ref 27.0–33.0)
MCHC: 33.3 g/dL (ref 32.0–36.0)
MCV: 100.2 fL — ABNORMAL HIGH (ref 80.0–100.0)
MPV: 12 fL (ref 7.5–12.5)
Monocytes Relative: 9.2 %
Neutro Abs: 5528 cells/uL (ref 1500–7800)
Neutrophils Relative %: 69.1 %
Platelets: 175 10*3/uL (ref 140–400)
RBC: 4.28 10*6/uL (ref 4.20–5.80)
RDW: 12.2 % (ref 11.0–15.0)
Total Lymphocyte: 19.7 %
WBC: 8 10*3/uL (ref 3.8–10.8)

## 2022-08-06 LAB — COMPLETE METABOLIC PANEL WITH GFR
AG Ratio: 2.1 (calc) (ref 1.0–2.5)
ALT: 18 U/L (ref 9–46)
AST: 23 U/L (ref 10–35)
Albumin: 4 g/dL (ref 3.6–5.1)
Alkaline phosphatase (APISO): 63 U/L (ref 35–144)
BUN: 17 mg/dL (ref 7–25)
CO2: 26 mmol/L (ref 20–32)
Calcium: 8.8 mg/dL (ref 8.6–10.3)
Chloride: 106 mmol/L (ref 98–110)
Creat: 0.86 mg/dL (ref 0.70–1.22)
Globulin: 1.9 g/dL (calc) (ref 1.9–3.7)
Glucose, Bld: 79 mg/dL (ref 65–99)
Potassium: 4.3 mmol/L (ref 3.5–5.3)
Sodium: 140 mmol/L (ref 135–146)
Total Bilirubin: 0.9 mg/dL (ref 0.2–1.2)
Total Protein: 5.9 g/dL — ABNORMAL LOW (ref 6.1–8.1)
eGFR: 84 mL/min/{1.73_m2} (ref >=60)

## 2022-08-06 LAB — TSH: TSH: 3.31 mIU/L (ref 0.40–4.50)

## 2022-08-10 ENCOUNTER — Telehealth: Payer: Self-pay | Admitting: Internal Medicine

## 2022-08-10 NOTE — Telephone Encounter (Signed)
Spoke to pt. He states "for the past 7-10 days  when I wake up I ave a full/water feeling in my calves." Pt states he was weighted 2 days ago it was 165.4 pounds. "It has been consistent over the past few years." Asked pt if he could purchase a scale. He states "oh we have a scale." Asked pt to take and record his weight daily to keep an eye on his weight game.

## 2022-08-10 NOTE — Telephone Encounter (Signed)
Called pt back. Relayed Dr. Blanchie Dessert message. Pt and his wife verbalized understanding, no further questions at this time.

## 2022-08-10 NOTE — Telephone Encounter (Signed)
Pt c/o swelling: STAT is pt has developed SOB within 24 hours  If swelling, where is the swelling located? Legs   How much weight have you gained and in what time span? No   Have you gained 3 pounds in a day or 5 pounds in a week? No   Do you have a log of your daily weights (if so, list)? No   Are you currently taking a fluid pill? Yes   Are you currently SOB? No   Have you traveled recently? No

## 2022-08-12 ENCOUNTER — Encounter: Payer: Self-pay | Admitting: Orthopedic Surgery

## 2022-08-12 ENCOUNTER — Ambulatory Visit (INDEPENDENT_AMBULATORY_CARE_PROVIDER_SITE_OTHER): Payer: Medicare Other | Admitting: Orthopedic Surgery

## 2022-08-12 VITALS — BP 122/80 | HR 53 | Temp 96.7°F | Resp 16 | Ht 67.0 in | Wt 167.0 lb

## 2022-08-12 DIAGNOSIS — L03115 Cellulitis of right lower limb: Secondary | ICD-10-CM

## 2022-08-12 MED ORDER — DOXYCYCLINE HYCLATE 100 MG PO TABS: 100.0000 mg | ORAL_TABLET | Freq: Two times a day (BID) | ORAL | 0 refills | Status: AC

## 2022-08-12 MED ORDER — MUPIROCIN 2 % EX OINT: 1.0000 | TOPICAL_OINTMENT | Freq: Every day | CUTANEOUS | 0 refills | Status: DC

## 2022-08-12 NOTE — Patient Instructions (Signed)
Please finish doxycycline, even if better  Apply mupirocin daily after showering, remove bandage at bedtime  Notify PCP if symptoms are not improving  If symptoms of pain, swelling, pus, fever worsen report to ED

## 2022-08-12 NOTE — Progress Notes (Signed)
Careteam: Patient Care Team: Octavia Heir, NP as PCP - General (Adult Health Nurse Practitioner) [name], MD as PCP - Cardiology (Cardiology) [name], MD as Surgeon (General Surgery) [name] (Dermatology) [name], MD as Consulting Physician (Ophthalmology) [name], MD as Consulting Physician (Neurology) [name], MD as Consulting Physician (Urology) [name], MD as Consulting Physician (Ophthalmology)  Seen by: [name], AGNP-C  PLACE OF SERVICE:  [facility]  Advanced Directive information Does Patient Have a Medical Advance Directive?: Yes, Type of Advance Directive: Healthcare Power of [place];Living will, Does patient want to make changes to medical advance directive?: Yes (ED - Information included in AVS)  Allergies  Allergen Reactions   Quetiapine Nausea And Vomiting    When taken with clonazepam (seprately they are fine)   Clonazepam Nausea And Vomiting    When taken with quetiapine (seprately they are fine)    Chief Complaint  Patient presents with   Acute Visit    Patient complains of feet being swollen and tender to touch.      HPI: Patient is a 88 y.o. male seen today for acute visit due to increased RLE pain.   He went for a pedicure last week. He was shocked by how hot the water was and ended up bumping right foot on filter and developed open wound. 04/30 he noticed his right foot began to have increased swelling, redness and pain. 05/01 wife noticed pus from wound. He has amoxicillin prn for dental procedures. He took 2000 mg yesterday and 500 mg this morning. He reports some improved pain. Afebrile. Vitals stable.    Review of Systems:  Review of Systems  Constitutional:  Negative for fever.  Respiratory:  Negative for cough, shortness of breath and wheezing.   Cardiovascular:  Positive for leg swelling. Negative for chest pain.  Skin:        Open wound  Psychiatric/Behavioral:   Negative for depression. The patient is not nervous/anxious.     Past Medical History:  Diagnosis Date   Acute on chronic respiratory failure with hypoxia (HCC)    Bone spur    Per [facility] new patient packet   BPH (benign prostatic hyperplasia)    CAD (coronary artery disease)    Cancer (HCC)    Melonoma on left shoulder blade removed   Cerebral amyloid angiopathy (HCC)    Chronic atrial fibrillation (HCC)    Chronic congestive heart failure with left ventricular diastolic dysfunction (HCC)    Diaphragmatic paralysis    Dyspnea    Dysrhythmia    ED (erectile dysfunction)    Foot drop    Per [facility] new patient packet   Ganglion cyst    Per [facility] new patient packet   Hepatitis    Hep A   History of DVT of lower extremity    BLE DVT ~ 04/2019   Hyperplasia of prostate    Per [facility] new patient packet   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, Per [facility] new patient packet   Pneumonia    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   STROKE X 2   Past Surgical History:  Procedure Laterality Date   ASCENDING AORTIC ANEURYSM REPAIR  02/22/2019   32 mm Hemashild graft, STJ to ascending interposition graft, resuspension of AV ([facility])   BACK SURGERY     CARDIAC CATHETERIZATION  02/15/2019   [facility]   CORONARY ARTERY BYPASS GRAFT  02/22/2019  LIMA to LAD, SVG to D1 [facility])   EYE SURGERY Bilateral    Cataract surgery   INGUINAL HERNIA REPAIR Right 11/14/2019   Procedure: RIGHT INGUINAL HERNIA REPAIR WITH MESH;  Surgeon: [name], MD;  Location: [facility] OR;  Service: General;  Laterality: Right;  LMA AND TAP BLOCK   JOINT REPLACEMENT Bilateral    knee replacement   L3-L4 Back surgery     LEFT ATRIAL APPENDAGE OCCLUSION N/A 05/29/2020   Procedure: LEFT ATRIAL APPENDAGE OCCLUSION;  Surgeon: [name], MD;  Location: [facility] INVASIVE CV LAB;  Service: Cardiovascular;  Laterality: N/A;   PEG TUBE PLACEMENT     sciatic nerve cyst removal     TEE WITHOUT CARDIOVERSION N/A  05/29/2020   Procedure: TRANSESOPHAGEAL ECHOCARDIOGRAM (TEE);  Surgeon: [name], MD;  Location: [facility] INVASIVE CV LAB;  Service: Cardiovascular;  Laterality: N/A;   TONSILLECTOMY     TRACHEOSTOMY     Social History:   reports that he has never smoked. He has never used smokeless tobacco. He reports current alcohol use of about 1.0 - 2.0 standard drink of alcohol per week. He reports that he does not use drugs.  Family History  Problem Relation Age of Onset   Diabetes Mother    Dementia Mother        Per [facility] new patient packet   Stroke Father    Diabetes Brother        Per [facility] new patient packet    Medications: Patient's Medications  New Prescriptions   No medications on file  Previous Medications   AMOXICILLIN PO    Take 1 tablet by mouth daily.   ASCORBIC ACID (VITAMIN C) 1000 MG TABLET    Take 1,000 mg by mouth daily.   ATORVASTATIN (LIPITOR) 80 MG TABLET    Take 1 tablet (80 mg total) by mouth daily.   CARBOXYMETHYLCELLULOSE SOD PF 0.5 % SOLN    Place 1 drop into both eyes in the morning and at bedtime.   FINASTERIDE (PROSCAR) 5 MG TABLET    Take 5 mg by mouth daily.   FUROSEMIDE (LASIX) 20 MG TABLET    Take 1 tablet (20 mg total) by mouth daily. May also take an additional tablet as needed for swelling.   LORATADINE (CLARITIN) 10 MG TABLET    Take 10 mg by mouth daily as needed for allergies.   MELATONIN 5 MG TABS    Take 5 mg by mouth.   METOPROLOL TARTRATE (LOPRESSOR) 25 MG TABLET    Take 1 tablet (25 mg total) by mouth 2 (two) times daily.   MULTIPLE VITAMIN (MULTIVITAMIN ADULT PO)    Take 1 tablet by mouth daily. MATURE [place]IR[place]   MULTIPLE VITAMINS-MINERALS (PRESERVISION AREDS 2 PO)    Take 1 tablet by mouth 2 (two) times daily.   PEGCETACOPLAN, OPHTHALMIC, (SYFOVRE) 15 MG/0.1ML SOLN    by Intravitreal route as directed. 1 injection into the eyes Every 6 weeks.   POLYVINYL ALCOHOL (LIQUIFILM TEARS) 1.4 % OPHTHALMIC SOLUTION    Place 1 drop into both eyes as needed for  dry eyes.   TADALAFIL (CIALIS) 5 MG TABLET    Take 5 mg by mouth daily.  Modified Medications   No medications on file  Discontinued Medications   BENZONATATE (TESSALON) 200 MG CAPSULE    Take 1 capsule (200 mg total) by mouth 2 (two) times daily as needed for cough.    Physical Exam:  Vitals:   08/12/22 1302  BP:  122/80  Pulse: (!) 53  Resp: 16  Temp: (!) 96.7 F (35.9 C)  SpO2: 98%  Weight: 167 lb (75.8 kg)  Height: 5\' 7"  (1.702 m)   Body mass index is 26.16 kg/m. Wt Readings from Last 3 Encounters:  08/12/22 167 lb (75.8 kg)  08/05/22 165 lb 6.4 oz (75 kg)  07/08/22 165 lb 9.6 oz (75.1 kg)    Physical Exam Vitals reviewed.  Constitutional:      General: He is not in acute distress. HENT:     Head: Normocephalic.  Eyes:     General:        Right eye: No discharge.        Left eye: No discharge.  Cardiovascular:     Rate and Rhythm: Normal rate. Rhythm irregular.     Pulses: Normal pulses.     Heart sounds: Normal heart sounds.  Pulmonary:     Effort: Pulmonary effort is normal. No respiratory distress.     Breath sounds: Normal breath sounds. No wheezing.  Musculoskeletal:     Cervical back: Neck supple.     Right lower leg: Edema present.     Left lower leg: No edema.     Comments: RLE non pitting  Skin:    General: Skin is warm.     Capillary Refill: Capillary refill takes less than 2 seconds.     Comments: Approx 0.5 cm round open wound over inner foot, increased swelling, erythema and tenderness, cool to touch, no granulation tissue over wound bed, scant purulent drainage noted, surrounding skin intact.   Neurological:     General: No focal deficit present.     Mental Status: He is alert and oriented to person, place, and time.  Psychiatric:        Mood and Affect: Mood normal.        Behavior: Behavior normal.     Labs reviewed: Basic Metabolic Panel: Recent Labs    01/07/22 0241 01/07/22 0248 08/05/22 1354  NA 136 137 140  K 4.2 4.1 4.3   CL 104 105 106  CO2 22  --  26  GLUCOSE 100* 95 79  BUN 20 21 17   CREATININE 0.96 0.90 0.86  CALCIUM 9.3  --  8.8  TSH  --   --  3.31   Liver Function Tests: Recent Labs    01/07/22 0241 08/05/22 1354  AST 34 23  ALT 28 18  ALKPHOS 83  --   BILITOT 0.7 0.9  PROT 6.3* 5.9*  ALBUMIN 3.9  --    No results for input(s): "LIPASE", "AMYLASE" in the last 8760 hours. No results for input(s): "AMMONIA" in the last 8760 hours. CBC: Recent Labs    01/07/22 0241 01/07/22 0248 08/05/22 1354  WBC 6.6  --  8.0  NEUTROABS 3.0  --  5,528  HGB 14.7 14.3 14.3  HCT 44.3 42.0 42.9  MCV 103.0*  --  100.2*  PLT 147*  --  175   Lipid Panel: Recent Labs    01/07/22 0241  CHOL 92  HDL 58  LDLCALC 28  TRIG 32  CHOLHDL 1.6   TSH: Recent Labs    08/05/22 1354  TSH 3.31   A1C: Lab Results  Component Value Date   HGBA1C 5.1 01/07/2022     Assessment/Plan: 1. Cellulitis of right foot - pedicure with injury last week> resulted in pea sized open wound - 04/30 increased swelling, erythema, tenderness, purulent drianage, afebrile - suspect cellulitis - will  start doxy with topical Mupirocin for MRSA coverage - advised to notify PCP if symptoms do not improve or worsen - doxycycline (VIBRA-TABS) 100 MG tablet; Take 1 tablet (100 mg total) by mouth 2 (two) times daily for 14 days.  Dispense: 28 tablet; Refill: 0 - mupirocin ointment (BACTROBAN) 2 %; Apply 1 Application topically daily.  Dispense: 22 g; Refill: 0  Total time: 24 minutes. Greater than 50% of total time spent doing patient education regarding cellulitis including symptom/medication management.   Next appt: 02/03/2023  [name], Juel Burrow  [facility] [phone]

## 2022-08-24 ENCOUNTER — Other Ambulatory Visit (HOSPITAL_COMMUNITY): Payer: Self-pay

## 2022-08-24 MED ORDER — STUDY - LIBREXIA-AF - JNJ-70033093 (MILVEXIAN) 100 MG OR PLACEBO (PI-SETHI): 1.0000 | ORAL_TABLET | Freq: Two times a day (BID) | ORAL | 0 refills | Status: DC

## 2022-08-24 MED ORDER — STUDY - LIBREXIA-AF - APIXABAN 5 MG OR PLACEBO CAPSULE (PI-SETHI): 5.0000 mg | ORAL_CAPSULE | Freq: Two times a day (BID) | ORAL | 0 refills | Status: DC

## 2022-10-05 ENCOUNTER — Other Ambulatory Visit: Payer: Self-pay | Admitting: *Deleted

## 2022-10-05 MED ORDER — METOPROLOL TARTRATE 25 MG PO TABS: 25.0000 mg | ORAL_TABLET | Freq: Two times a day (BID) | ORAL | 1 refills | Status: DC

## 2022-10-11 ENCOUNTER — Ambulatory Visit (INDEPENDENT_AMBULATORY_CARE_PROVIDER_SITE_OTHER): Payer: Medicare Other | Admitting: Orthopedic Surgery

## 2022-10-11 ENCOUNTER — Encounter: Payer: Self-pay | Admitting: Orthopedic Surgery

## 2022-10-11 DIAGNOSIS — Z0000 Encounter for general adult medical examination without abnormal findings: Secondary | ICD-10-CM | POA: Diagnosis not present

## 2022-10-11 NOTE — Patient Instructions (Signed)
  Mr. Barberi , Thank you for taking time to come for your Medicare Wellness Visit. I appreciate your ongoing commitment to your health goals. Please review the following plan we discussed and let me know if I can assist you in the future.   These are the goals we discussed:  Goals      DIET - INCREASE WATER INTAKE        This is a list of the screening recommended for you and due dates:  Health Maintenance  Topic Date Due   COVID-19 Vaccine (8 - 2023-24 season) 03/17/2022   Flu Shot  11/11/2022   Medicare Annual Wellness Visit  10/11/2023   DTaP/Tdap/Td vaccine (8 - Td or Tdap) 12/28/2027   Pneumonia Vaccine  Completed   Zoster (Shingles) Vaccine  Completed   HPV Vaccine  Aged Out   Recommend Prevnar 20 at local pharmacy

## 2022-10-11 NOTE — Progress Notes (Signed)
Subjective:   Travis Brewer is a 88 y.o. male who presents for Medicare Annual/Subsequent preventive examination.  Place of Service:[facility] Provider: [name], AGNP-C   Visit Complete: In person  Patient Medicare AWV questionnaire was completed by the patient on 10/11/2022; I have confirmed that all information answered by patient is correct and no changes since this date.  Review of Systems           Objective:    There were no vitals filed for this visit. There is no height or weight on file to calculate BMI.     08/12/2022    1:04 PM 08/05/2022    1:14 PM 07/08/2022    1:58 PM 01/07/2022    3:07 AM 05/29/2020    8:27 AM 11/14/2019   10:00 AM 11/08/2019    1:39 PM  Advanced Directives  Does Patient Have a Medical Advance Directive? Yes Yes Yes Yes Yes Yes Yes  Type of Estate agent of [place];Living will Healthcare Power of [place];Living will Healthcare Power of [place];Living will Healthcare Power of [place];Living will Healthcare Power of [place];Living will  Living will  Does patient want to make changes to medical advance directive? Yes (ED - Information included in AVS) No - Patient declined No - Patient declined      Copy of Healthcare Power of Attorney in Chart? No - copy requested No - copy requested No - copy requested No - copy requested, Physician notified No - copy requested      Current Medications (verified) Outpatient Encounter Medications as of 10/11/2022  Medication Sig   AMOXICILLIN PO Take 1 tablet by mouth daily. Before dentist appointments.   Ascorbic Acid (VITAMIN C) 1000 MG tablet Take 1,000 mg by mouth daily.   atorvastatin (LIPITOR) 80 MG tablet Take 1 tablet (80 mg total) by mouth daily.   Carboxymethylcellulose Sod PF 0.5 % SOLN Place 1 drop into both eyes in the morning and at bedtime.   finasteride (PROSCAR) 5 MG tablet Take 5 mg by mouth daily.   furosemide (LASIX) 20 MG tablet Take 1 tablet (20 mg  total) by mouth daily. May also take an additional tablet as needed for swelling.   loratadine (CLARITIN) 10 MG tablet Take 10 mg by mouth daily as needed for allergies.   melatonin 5 MG TABS Take 5 mg by mouth.   metoprolol tartrate (LOPRESSOR) 25 MG tablet Take 1 tablet (25 mg total) by mouth 2 (two) times daily.   Multiple Vitamin (MULTIVITAMIN ADULT PO) Take 1 tablet by mouth daily. MATURE [place]   Multiple Vitamins-Minerals (PRESERVISION AREDS 2 PO) Take 1 tablet by mouth 2 (two) times daily.   Pegcetacoplan, Ophthalmic, (SYFOVRE) 15 MG/0.1ML SOLN by Intravitreal route as directed. 1 injection into the eyes Every 6 weeks.   polyvinyl alcohol (LIQUIFILM TEARS) 1.4 % ophthalmic solution Place 1 drop into both eyes as needed for dry eyes.   STUDY - LIBREXIA-AF - apixaban 5 mg or placebo capsule (PI-Sethi) Take 1 capsule (5 mg total) by mouth 2 (two) times daily. Take at approximately the same time of day with or without food. Please bring bottle back with you to every visit; do not discard bottle. (FOR INVESTIGATIONAL USE ONLY). Please contact [facility] for any questions or concerns regarding this medication.   STUDY - LIBREXIA-AF - [id number] (Milvexian) 100 mg or placebo tablet (PI-Sethi) Take 1 tablet by mouth 2 (two) times daily. Take at approximately the same time of day  with or without food. Please bring bottle back with you to every visit; do not discard bottle. (FOR INVESTIGATIONAL USE ONLY). Please contact [facility] for any questions or concerns regarding this medication.   tadalafil (CIALIS) 5 MG tablet Take 5 mg by mouth daily.   [DISCONTINUED] mupirocin ointment (BACTROBAN) 2 % Apply 1 Application topically daily.   No facility-administered encounter medications on file as of 10/11/2022.    Allergies (verified) Quetiapine and Clonazepam   History: Past Medical History:  Diagnosis Date   Acute on chronic respiratory failure with hypoxia (HCC)     Bone spur    Per [facility] new patient packet   BPH (benign prostatic hyperplasia)    CAD (coronary artery disease)    Cancer (HCC)    Melonoma on left shoulder blade removed   Cerebral amyloid angiopathy (HCC)    Chronic atrial fibrillation (HCC)    Chronic congestive heart failure with left ventricular diastolic dysfunction (HCC)    Diaphragmatic paralysis    Dyspnea    Dysrhythmia    ED (erectile dysfunction)    Foot drop    Per [facility] new patient packet   Ganglion cyst    Per [facility] new patient packet   Hepatitis    Hep A   History of DVT of lower extremity    BLE DVT ~ 04/2019   Hyperplasia of prostate    Per [facility] new patient packet   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, Per [facility] new patient packet   Pneumonia    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   STROKE X 2   Past Surgical History:  Procedure Laterality Date   ASCENDING AORTIC ANEURYSM REPAIR  02/22/2019   32 mm Hem[place]ild graft, STJ to ascending interposition graft, resuspension of AV ([place])   BACK SURGERY     CARDIAC CATHETERIZATION  02/15/2019   [facility]   CORONARY ARTERY BYPASS GRAFT  02/22/2019   LIMA to LAD, SVG to D1 [facility])   EYE SURGERY Bilateral    Cataract surgery   INGUINAL HERNIA REPAIR Right 11/14/2019   Procedure: RIGHT INGUINAL HERNIA REPAIR WITH MESH;  Surgeon: [name], MD;  Location: [facility] OR;  Service: General;  Laterality: Right;  LMA AND TAP BLOCK   JOINT REPLACEMENT Bilateral    knee replacement   L3-L4 Back surgery     LEFT ATRIAL APPENDAGE OCCLUSION N/A 05/29/2020   Procedure: LEFT ATRIAL APPENDAGE OCCLUSION;  Surgeon: [name], MD;  Location: [facility] INVASIVE CV LAB;  Service: Cardiovascular;  Laterality: N/A;   PEG TUBE PLACEMENT     sciatic nerve cyst removal     TEE WITHOUT CARDIOVERSION N/A 05/29/2020   Procedure: TRANSESOPHAGEAL ECHOCARDIOGRAM (TEE);  Surgeon: [name], MD;  Location: [facility] INVASIVE CV LAB;  Service: Cardiovascular;  Laterality:  N/A;   TONSILLECTOMY     TRACHEOSTOMY     Family History  Problem Relation Age of Onset   Diabetes Mother    Dementia Mother        Per [facility] new patient packet   Stroke Father    Diabetes Brother        Per [facility] new patient packet   Social History   Socioeconomic History   Marital status: Married    Spouse name: Not on file   Number of children: Not on file   Years of education: Not on file   Highest education level: Master's degree (e.g., MA, MS, MEng, MEd, MSW, MBA)  Occupational  History   Not on file  Tobacco Use   Smoking status: Never   Smokeless tobacco: Never  Vaping Use   Vaping Use: Never used  Substance and Sexual Activity   Alcohol use: Yes    Alcohol/week: 1.0 - 2.0 standard drink of alcohol    Types: 1 - 2 Glasses of wine per week    Comment: wine nightly   Drug use: Never   Sexual activity: Not on file  Other Topics Concern   Not on file  Social History Narrative   Right handed      Per [facility] new patient packet:   Diet: Health      Caffeine:Yes, coffee      Married, if yes what year: 2      Do you live in a house, apartment, assisted living, condo, trailer, ect: House      Is it one or more stories: One      How many persons live in your home? 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 No   Do you have difficulty preparing food or eating? No   Do you have difficulty managing your medications?No   Do you have difficulty managing your finances? No   Do you have difficulty affording your medications? No   Social Determinants of Health   Financial Resource Strain: Low Risk  (08/01/2022)   Overall Financial  Resource Strain (CARDIA)    Difficulty of Paying Living Expenses: Not hard at all  Food Insecurity: No Food Insecurity (08/01/2022)   Hunger Vital Sign    Worried About Running Out of Food in the Last Year: Never true    Ran Out of Food in the Last Year: Never true  Transportation Needs: No Transportation Needs (08/01/2022)   PRAPARE - Administrator, Civil Service (Medical): No    Lack of Transportation (Non-Medical): No  Physical Activity: Sufficiently Active (08/01/2022)   Exercise Vital Sign    Days of Exercise per Week: 3 days    Minutes of Exercise per Session: 50 min  Stress: No Stress Concern Present (08/01/2022)   [employer] of Occupational Health - Occupational Stress Questionnaire    Feeling of Stress : Not at all  Social Connections: Unknown (08/01/2022)   Social Connection and Isolation Panel [NHANES]    Frequency of Communication with Friends and Family: Not on file    Frequency of Social Gatherings with Friends and Family: Once a week    Attends Religious Services: Never    Database administrator or Organizations: No    Attends Engineer, structural: Not on file    Marital Status: Married    Tobacco Counseling Counseling given: Not Answered   Clinical Intake:              How often do you need to have someone help you when you read instructions, pamphlets, or other written materials from your  doctor or pharmacy?: (P) 3 - Sometimes         Activities of Daily Living    10/08/2022    8:14 AM 01/07/2022    4:54 AM  In your present state of health, do you have any difficulty performing the following activities:  Hearing? 1   Vision? 1   Difficulty concentrating or making decisions? 0   Walking or climbing stairs? 0   Dressing or bathing? 0   Doing errands, shopping? 1 0  Preparing Food and eating ? N   Using the Toilet? N   In the past six months, have you accidently leaked urine? N   Do you have problems with loss of bowel  control? N   Managing your Medications? N   Managing your Finances? N   Housekeeping or managing your Housekeeping? N     Patient Care Team: Octavia Heir, NP as PCP - General (Adult Health Nurse Practitioner) [name], MD as PCP - Cardiology (Cardiology) [name], MD as Surgeon (General Surgery) [name] (Dermatology) [name], MD as Consulting Physician (Ophthalmology) [name], MD as Consulting Physician (Neurology) [name], MD as Consulting Physician (Urology) [name], MD as Consulting Physician (Ophthalmology)  Indicate any recent Medical Services you may have received from other than [facility] providers in the past year (date may be approximate).     Assessment:   This is a routine wellness examination for Travis Brewer.  Hearing/Vision screen Hearing Screening - Comments:: No hearing concerns. Patient wears hearing aids.  Vision Screening - Comments:: No vision concerns. Last eye exam 2023. Patient wears reading glasses.   Dietary issues and exercise activities discussed:     Goals Addressed   None    Depression Screen    10/11/2022    3:03 PM  PHQ 2/9 Scores  PHQ - 2 Score 0    Fall Risk    10/11/2022    3:03 PM 10/08/2022    8:14 AM 08/12/2022    1:04 PM 08/05/2022    1:13 PM 07/08/2022    1:58 PM  Fall Risk   Falls in the past year? 0 0 0 0 0  Number falls in past yr: 0  0 0 0  Injury with Fall? 0  0 0 0  Risk for fall due to : No Fall Risks  No Fall Risks No Fall Risks No Fall Risks  Follow up Falls evaluation completed  Falls evaluation completed Falls evaluation completed Falls evaluation completed    MEDICARE RISK AT HOME:   TIMED UP AND GO:  Was the test performed?  No    Cognitive Function:    08/05/2022    1:46 PM  MMSE - Mini Mental State Exam  Orientation to time 5  Orientation to Place 4  Registration 3  Attention/ Calculation 2  Recall 2  Language- name 2 objects 2  Language-  repeat 1  Language- follow 3 step command 3  Language- read & follow direction 0  Write a sentence 1  Copy design 0  Total score 23        Immunizations Immunization History  Administered Date(s) Administered   Fluad Quad(high Dose 65+) 01/01/2016, 12/11/2016, 12/28/2016, 11/29/2018, 01/15/2020, 01/20/2022   H1N1 03/20/2008   Influenza,inj,Quad PF,6+ Mos 01/15/2020   Influenza-Unspecified 01/11/2007, 12/26/2007, 01/11/2008, 11/28/2008, 12/25/2009, 12/24/2011, 01/05/2013, 01/01/2016, 12/11/2016, 12/28/2016, 01/05/2018, 11/29/2018   Moderna Sars-Covid-2 Vaccination 01/20/2022   PFIZER(Purple Top)SARS-COV-2 Vaccination 07/18/2019, 08/07/2019, 11/30/2019, 07/18/2020, 12/22/2020  Research officer, trade union 71yrs & up 12/22/2020   Pneumococcal Conjugate PCV 7 04/12/2002   Pneumococcal Conjugate,unspecified 08/29/2017   Pneumococcal Conjugate-13 08/13/2013   Pneumococcal Polysaccharide-23 09/05/2014, 09/04/2020   Pneumococcal-Unspecified 04/12/2002, 04/12/2002, 09/05/2014   Respiratory Syncytial Virus Vaccine,Recomb Aduvanted(Arexvy) 03/27/2022   Td 04/12/2002, 12/03/2010, 04/13/2011   Td (Adult),unspecified 04/12/2002, 12/03/2010, 04/13/2011   Tdap 12/27/2017   Zoster Recombinant(Shingrix) 12/27/2017, 06/20/2018, 10/02/2020   Zoster, Live 04/12/2008    TDAP status: Up to date  Flu Vaccine status: Up to date  Pneumococcal vaccine status: Up to date  Covid-19 vaccine status: Completed vaccines  Qualifies for Shingles Vaccine? Yes   Zostavax completed Yes   Shingrix Completed?: Yes  Screening Tests Health Maintenance  Topic Date Due   Medicare Annual Wellness (AWV)  02/26/2022   COVID-19 Vaccine (7 - 2023-24 season) 03/17/2022   INFLUENZA VACCINE  11/11/2022   DTaP/Tdap/Td (8 - Td or Tdap) 12/28/2027   Pneumonia Vaccine 51+ Years old  Completed   Zoster Vaccines- Shingrix  Completed   HPV VACCINES  Aged Out    Health Maintenance  Health Maintenance Due   Topic Date Due   Medicare Annual Wellness (AWV)  02/26/2022   COVID-19 Vaccine (7 - 2023-24 season) 03/17/2022    Colorectal cancer screening: No longer required.   Lung Cancer Screening: (Low Dose CT Chest recommended if Age 69-80 years, 20 pack-year currently smoking OR have quit w/in 15years.) does not qualify.   Lung Cancer Screening Referral: No  Additional Screening:  Hepatitis C Screening: does not qualify; Completed   Vision Screening: Recommended annual ophthalmology exams for early detection of glaucoma and other disorders of the eye.Dr. Luciana Axe Is the patient up to date with their annual eye exam?  Yes  Who is the provider or what is the name of the office in which the patient attends annual eye exams?   If pt is not established with a provider, would they like to be referred to a provider to establish care? No .   Dental Screening: Recommended annual dental exams for proper oral hygiene  Diabetic Foot Exam: Diabetic Foot Exam: Completed 08/30/2022  Community Resource Referral / Chronic Care Management: CRR required this visit?  No   CCM required this visit?  No     Plan:     I have personally reviewed and noted the following in the patient's chart:   Medical and social history Use of alcohol, tobacco or illicit drugs  Current medications and supplements including opioid prescriptions. Patient is not currently taking opioid prescriptions. Functional ability and status Nutritional status Physical activity Advanced directives List of other physicians Hospitalizations, surgeries, and ER visits in previous 12 months Vitals Screenings to include cognitive, depression, and falls Referrals and appointments  In addition, I have reviewed and discussed with patient certain preventive protocols, quality metrics, and best practice recommendations. A written personalized care plan for preventive services as well as general preventive health recommendations were  provided to patient.     Octavia Heir, NP   10/11/2022   After Visit Summary: (MyChart) Due to this being a telephonic visit, the after visit summary with patients personalized plan was offered to patient via MyChart   Nurse Notes: Recommend Prevnar 20 at local pharmacy  Virtual Visit via Telephone Note  I connected with No patient name on file. on @TODAY @ at  by telephone and verified that I am speaking with the correct person using two identifiers.  Location:[facility] clinic Patient: Travis Brewer  Provider: Octavia Heir, NP    I discussed the limitations, risks, security and privacy concerns of performing an evaluation and management service by telephone and the availability of in person appointments. I also discussed with the patient that there may be a patient responsible charge related to this service. The patient expressed understanding and agreed to proceed.   I discussed the assessment and treatment plan with the patient. The patient was provided an opportunity to ask questions and all were answered. The patient agreed with the plan and demonstrated an understanding of the instructions.   The patient was advised to call back or seek an in-person evaluation if the symptoms worsen or if the condition fails to improve as anticipated.  I provided 15 minutes of face-to-face time during this encounter.  Neva Ramaswamy Coletta Memos, AGNP Avs printed and mailed

## 2022-10-11 NOTE — Progress Notes (Signed)
   This service is provided via telemedicine  No vital signs collected/recorded due to the encounter was a telemedicine visit.   Location of patient (ex: home, work):  Home  Patient consents to a telephone visit:  Yes  Location of the provider (ex: office, home):  [facility]   Name of any referring provider:  Octavia Heir, NP   Names of all persons participating in the telemedicine service and their role in the encounter:  Patient, Wife Travis Brewer, Meda Klinefelter, RMA, [name], NP.    Time spent on call: 8 minutes spent on the phone with Medical Assistant.

## 2022-11-25 ENCOUNTER — Other Ambulatory Visit: Payer: Self-pay | Admitting: Internal Medicine

## 2022-12-14 ENCOUNTER — Other Ambulatory Visit: Payer: Self-pay | Admitting: Physician Assistant

## 2022-12-23 ENCOUNTER — Ambulatory Visit (INDEPENDENT_AMBULATORY_CARE_PROVIDER_SITE_OTHER): Payer: Medicare Other | Admitting: Orthopedic Surgery

## 2022-12-23 ENCOUNTER — Encounter: Payer: Self-pay | Admitting: Orthopedic Surgery

## 2022-12-23 VITALS — BP 106/80 | HR 62 | Temp 97.4°F | Resp 16 | Ht 67.0 in | Wt 163.6 lb

## 2022-12-23 DIAGNOSIS — H6123 Impacted cerumen, bilateral: Secondary | ICD-10-CM

## 2022-12-23 NOTE — Progress Notes (Signed)
Careteam: Patient Care Team: Travis Heir, NP as PCP - General (Adult Health Nurse Practitioner) [name], MD as PCP - Cardiology (Cardiology) [name], MD as Surgeon (General Surgery) [name] (Dermatology) [name], MD as Consulting Physician (Ophthalmology) [name], MD as Consulting Physician (Neurology) [name], MD as Consulting Physician (Urology) [name], MD as Consulting Physician (Ophthalmology)  Seen by: [name], AGNP-C  PLACE OF SERVICE:  [facility]  Advanced Directive information Does Patient Have a Medical Advance Directive?: Yes, Type of Advance Directive: Healthcare Power of [place];Living will, Does patient want to make changes to medical advance directive?: No - Patient declined  Allergies  Allergen Reactions   Quetiapine Nausea And Vomiting    When taken with clonazepam (seprately they are fine)   Clonazepam Nausea And Vomiting    When taken with quetiapine (seprately they are fine)    Chief Complaint  Patient presents with   Referral    Patient is requesting referral to EMT for hearing loss in right ear.      HPI: Patient is a 88 y.o. male seen today for acute visit due to hearing loss.   He lost hearing to right ear suddenly yesterday. Uses bilateral hearing aids. He is followed by [facility] and audiology. H/o cerumen impaction 1 year ago, improved with ear lavage. Denies ear pain or drainage.   Review of Systems:  Review of Systems  Constitutional:  Negative for fever.  HENT:  Negative for ear discharge, ear pain and tinnitus.   Respiratory:  Negative for cough.   Cardiovascular:  Negative for chest pain.  Psychiatric/Behavioral:  Negative for depression. The patient is not nervous/anxious.     Past Medical History:  Diagnosis Date   Acute on chronic respiratory failure with hypoxia (HCC)    Bone spur    Per [facility] new patient packet   BPH (benign prostatic hyperplasia)    CAD  (coronary artery disease)    Cancer (HCC)    Melonoma on left shoulder blade removed   Cerebral amyloid angiopathy (HCC)    Chronic atrial fibrillation (HCC)    Chronic congestive heart failure with left ventricular diastolic dysfunction (HCC)    Diaphragmatic paralysis    Dyspnea    Dysrhythmia    ED (erectile dysfunction)    Foot drop    Per [facility] new patient packet   Ganglion cyst    Per [facility] new patient packet   Hepatitis    Hep A   History of DVT of lower extremity    BLE DVT ~ 04/2019   Hyperplasia of prostate    Per [facility] new patient packet   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, Per [facility] new patient packet   Pneumonia    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   STROKE X 2   Past Surgical History:  Procedure Laterality Date   ASCENDING AORTIC ANEURYSM REPAIR  02/22/2019   32 mm Hemashild graft, STJ to ascending interposition graft, resuspension of AV ([name])   BACK SURGERY     CARDIAC CATHETERIZATION  02/15/2019   [facility]   CORONARY ARTERY BYPASS GRAFT  02/22/2019   LIMA to LAD, SVG to D1 [name])   EYE SURGERY Bilateral    Cataract surgery   INGUINAL HERNIA REPAIR Right 11/14/2019   Procedure: RIGHT INGUINAL HERNIA REPAIR WITH MESH;  Surgeon: [name], MD;  Location: [facility] OR;  Service: General;  Laterality:  Right;  LMA AND TAP BLOCK   JOINT REPLACEMENT Bilateral    knee replacement   L3-L4 Back surgery     LEFT ATRIAL APPENDAGE OCCLUSION N/A 05/29/2020   Procedure: LEFT ATRIAL APPENDAGE OCCLUSION;  Surgeon: [name], MD;  Location: [facility] INVASIVE CV LAB;  Service: Cardiovascular;  Laterality: N/A;   PEG TUBE PLACEMENT     sciatic nerve cyst removal     TEE WITHOUT CARDIOVERSION N/A 05/29/2020   Procedure: TRANSESOPHAGEAL ECHOCARDIOGRAM (TEE);  Surgeon: [name], MD;  Location: [facility] INVASIVE CV LAB;  Service: Cardiovascular;  Laterality: N/A;   TONSILLECTOMY     TRACHEOSTOMY     Social History:   reports that he has never  smoked. He has never used smokeless tobacco. He reports current alcohol use of about 1.0 - 2.0 standard drink of alcohol per week. He reports that he does not use drugs.  Family History  Problem Relation Age of Onset   Diabetes Mother    Dementia Mother        Per [facility] new patient packet   Stroke Father    Diabetes Brother        Per [facility] new patient packet    Medications: Patient's Medications  New Prescriptions   No medications on file  Previous Medications   AMOXICILLIN PO    Take 1 tablet by mouth daily. Before dentist appointments.   ASCORBIC ACID (VITAMIN C) 1000 MG TABLET    Take 1,000 mg by mouth daily.   ATORVASTATIN (LIPITOR) 80 MG TABLET    Take 1 tablet (80 mg total) by mouth daily.   CARBAMIDE PEROXIDE (DEBROX) 6.5 % OTIC SOLUTION    Place 5 drops into both ears 2 (two) times daily.   CARBOXYMETHYLCELLULOSE SOD PF 0.5 % SOLN    Place 1 drop into both eyes in the morning and at bedtime.   FINASTERIDE (PROSCAR) 5 MG TABLET    Take 5 mg by mouth daily.   FUROSEMIDE (LASIX) 20 MG TABLET    Take 1 tablet (20 mg total) by mouth daily. May also take an additional tablet as needed for swelling.   LORATADINE (CLARITIN) 10 MG TABLET    Take 10 mg by mouth daily as needed for allergies.   MELATONIN 5 MG TABS    Take 5 mg by mouth.   METOPROLOL TARTRATE (LOPRESSOR) 25 MG TABLET    TAKE 1 TABLET TWICE A DAY   MULTIPLE VITAMIN (MULTIVITAMIN ADULT PO)    Take 1 tablet by mouth daily. MATURE KIRKLAND   MULTIPLE VITAMINS-MINERALS (PRESERVISION AREDS 2 PO)    Take 1 tablet by mouth 2 (two) times daily.   PEGCETACOPLAN, OPHTHALMIC, (SYFOVRE) 15 MG/0.1ML SOLN    by Intravitreal route as directed. 1 injection into the eyes Every 6 weeks.   POLYVINYL ALCOHOL (LIQUIFILM TEARS) 1.4 % OPHTHALMIC SOLUTION    Place 1 drop into both eyes as needed for dry eyes.   STUDY - LIBREXIA-AF - APIXABAN 5 MG OR PLACEBO CAPSULE (PI-SETHI)    Take 1 capsule (5 mg total) by mouth 2 (two) times daily. Take at  approximately the same time of day with or without food. Please bring bottle back with you to every visit; do not discard bottle. (FOR INVESTIGATIONAL USE ONLY). Please contact [facility] for any questions or concerns regarding this medication.   STUDY - LIBREXIA-AF - [id number] (MILVEXIAN) 100 MG OR PLACEBO TABLET (PI-SETHI)    Take 1 tablet by mouth 2 (two) times daily. Take  at approximately the same time of day with or without food. Please bring bottle back with you to every visit; do not discard bottle. (FOR INVESTIGATIONAL USE ONLY). Please contact [facility] for any questions or concerns regarding this medication.   TADALAFIL (CIALIS) 5 MG TABLET    Take 5 mg by mouth daily.  Modified Medications   No medications on file  Discontinued Medications   No medications on file    Physical Exam:  Vitals:   12/23/22 1318  BP: 106/80  Pulse: 62  Resp: 16  Temp: (!) 97.4 F (36.3 C)  SpO2: 96%  Weight: 163 lb 9.6 oz (74.2 kg)  Height: 5\' 7"  (1.702 m)   Body mass index is 25.62 kg/m. Wt Readings from Last 3 Encounters:  12/23/22 163 lb 9.6 oz (74.2 kg)  08/12/22 167 lb (75.8 kg)  08/05/22 165 lb 6.4 oz (75 kg)    Physical Exam Vitals reviewed.  Constitutional:      General: He is not in acute distress. HENT:     Head: Normocephalic.     Right Ear: There is impacted cerumen.     Left Ear: There is impacted cerumen.  Eyes:     General:        Right eye: No discharge.        Left eye: No discharge.  Cardiovascular:     Rate and Rhythm: Normal rate. Rhythm irregular.     Pulses: Normal pulses.     Heart sounds: Normal heart sounds.  Pulmonary:     Effort: Pulmonary effort is normal. No respiratory distress.     Breath sounds: Normal breath sounds. No wheezing.  Skin:    Capillary Refill: Capillary refill takes less than 2 seconds.  Neurological:     General: No focal deficit present.     Mental Status: He is alert and oriented to person,  place, and time.  Psychiatric:        Mood and Affect: Mood normal.     Labs reviewed: Basic Metabolic Panel: Recent Labs    01/07/22 0241 01/07/22 0248 08/05/22 1354  NA 136 137 140  K 4.2 4.1 4.3  CL 104 105 106  CO2 22  --  26  GLUCOSE 100* 95 79  BUN 20 21 17   CREATININE 0.96 0.90 0.86  CALCIUM 9.3  --  8.8  TSH  --   --  3.31   Liver Function Tests: Recent Labs    01/07/22 0241 08/05/22 1354  AST 34 23  ALT 28 18  ALKPHOS 83  --   BILITOT 0.7 0.9  PROT 6.3* 5.9*  ALBUMIN 3.9  --    No results for input(s): "LIPASE", "AMYLASE" in the last 8760 hours. No results for input(s): "AMMONIA" in the last 8760 hours. CBC: Recent Labs    01/07/22 0241 01/07/22 0248 08/05/22 1354  WBC 6.6  --  8.0  NEUTROABS 3.0  --  5,528  HGB 14.7 14.3 14.3  HCT 44.3 42.0 42.9  MCV 103.0*  --  100.2*  PLT 147*  --  175   Lipid Panel: Recent Labs    01/07/22 0241  CHOL 92  HDL 58  LDLCALC 28  TRIG 32  CHOLHDL 1.6   TSH: Recent Labs    08/05/22 1354  TSH 3.31   A1C: Lab Results  Component Value Date   HGBA1C 5.1 01/07/2022     Assessment/Plan 1. Bilateral impacted cerumen - right ear with sudden hearing loss yesterday -  unable to assess TM due to cerumen - start debrox- 5 gtts to both ears x 5 days - scheduled 09/19 for ear lavage  Total time: 12 minutes. Greater than 50% of total time spent doing patient education regarding cerumen impaction including symptom/medication management.     Next appt: 02/03/2023  [name], Juel Burrow  [facility] [phone]

## 2022-12-23 NOTE — Patient Instructions (Signed)
Recommend Debrox (carbamide peroxide)- apply 5 drops to both ears twice daily x 5 days  We will flush next week at appointment

## 2022-12-30 ENCOUNTER — Ambulatory Visit: Payer: Medicare Other | Admitting: Orthopedic Surgery

## 2022-12-30 ENCOUNTER — Ambulatory Visit (INDEPENDENT_AMBULATORY_CARE_PROVIDER_SITE_OTHER): Payer: Medicare Other | Admitting: Orthopedic Surgery

## 2022-12-30 ENCOUNTER — Encounter: Payer: Self-pay | Admitting: Orthopedic Surgery

## 2022-12-30 VITALS — BP 122/80 | HR 75 | Temp 97.9°F | Resp 14 | Ht 67.0 in | Wt 162.2 lb

## 2022-12-30 DIAGNOSIS — H6123 Impacted cerumen, bilateral: Secondary | ICD-10-CM

## 2022-12-30 NOTE — Patient Instructions (Signed)
Recommend using 5 gtts to both ears x 3 days every month for prevention

## 2022-12-30 NOTE — Progress Notes (Signed)
Careteam: Patient Care Team: Octavia Heir, NP as PCP - General (Adult Health Nurse Practitioner) [name], MD as PCP - Cardiology (Cardiology) [name], MD as Surgeon (General Surgery) [name] (Dermatology) [name], MD as Consulting Physician (Ophthalmology) [name], MD as Consulting Physician (Neurology) [name], MD as Consulting Physician (Urology) [name], MD as Consulting Physician (Ophthalmology)  Seen by: [name], AGNP-C  PLACE OF SERVICE:  [facility]  Advanced Directive information Does Patient Have a Medical Advance Directive?: Yes, Type of Advance Directive: Healthcare Power of [place], Does patient want to make changes to medical advance directive?: Yes (Inpatient - patient defers changing a medical advance directive at this time - Information given)  Allergies  Allergen Reactions  . Quetiapine Nausea And Vomiting    When taken with clonazepam (seprately they are fine)  . Clonazepam Nausea And Vomiting    When taken with quetiapine (seprately they are fine)    Chief Complaint  Patient presents with  . Acute Visit    Ear Lavage - has been using drops     HPI: Patient is a 88 y.o. male seen today for acute visit due to cerumen impaction.   09/12 he had sudden hearing loss to right ear. Other provider noted wax buildup. He also wears hearing aids. Advised to use Debrox BID x 5 days. He has been using drops as advised.   Review of Systems:  Review of Systems  HENT:  Positive for hearing loss.   Respiratory:  Negative for shortness of breath.   Cardiovascular:  Negative for chest pain.  Psychiatric/Behavioral:  Negative for depression. The patient is not nervous/anxious.     Past Medical History:  Diagnosis Date  . Acute on chronic respiratory failure with hypoxia (HCC)   . Bone spur    Per [facility] new patient packet  . BPH (benign prostatic hyperplasia)   . CAD (coronary artery disease)    . Cancer (HCC)    Melonoma on left shoulder blade removed  . Cerebral amyloid angiopathy (HCC)   . Chronic atrial fibrillation (HCC)   . Chronic congestive heart failure with left ventricular diastolic dysfunction (HCC)   . Diaphragmatic paralysis   . Dyspnea   . Dysrhythmia   . ED (erectile dysfunction)   . Foot drop    Per [facility] new patient packet  . Ganglion cyst    Per [facility] new patient packet  . Hepatitis    Hep A  . History of DVT of lower extremity    BLE DVT ~ 04/2019  . Hyperplasia of prostate    Per [facility] new patient packet  . 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 Pneumonia   . 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 ASCENDING AORTIC ANEURYSM REPAIR  02/22/2019   32 mm Hemashild graft, STJ to ascending interposition graft, resuspension of AV [facility])  . BACK SURGERY    . CARDIAC CATHETERIZATION  02/15/2019   [facility]  . CORONARY ARTERY BYPASS GRAFT  02/22/2019   LIMA to LAD, SVG to D1 [facility])  . EYE SURGERY Bilateral    Cataract surgery  . INGUINAL HERNIA REPAIR Right 11/14/2019   Procedure: RIGHT INGUINAL HERNIA REPAIR WITH MESH;  Surgeon: [name], MD;  Location: [facility] OR;  Service: General;  Laterality: Right;  LMA AND TAP BLOCK  . JOINT REPLACEMENT Bilateral    knee replacement  . L3-L4 Back surgery    . LEFT ATRIAL APPENDAGE OCCLUSION N/A 05/29/2020   Procedure: LEFT ATRIAL APPENDAGE OCCLUSION;  Surgeon: [name], MD;  Location: [facility] INVASIVE CV LAB;  Service: Cardiovascular;  Laterality: N/A;  . PEG TUBE PLACEMENT    . sciatic nerve cyst removal    . TEE WITHOUT CARDIOVERSION N/A 05/29/2020   Procedure: TRANSESOPHAGEAL ECHOCARDIOGRAM (TEE);  Surgeon: [name], MD;  Location: [facility] INVASIVE CV LAB;  Service: Cardiovascular;  Laterality: N/A;  . TONSILLECTOMY    . TRACHEOSTOMY     Social History:   reports that he has never  smoked. He has never used smokeless tobacco. He reports current alcohol use of about 1.0 - 2.0 standard drink of alcohol per week. He reports that he does not use drugs.  Family History  Problem Relation Age of Onset  . Diabetes Mother   . Dementia Mother        Per [facility] new patient packet  . Stroke Father   . Diabetes Brother        Per [facility] new patient packet    Medications: Patient's Medications  New Prescriptions   No medications on file  Previous Medications   AMOXICILLIN PO    Take 1 tablet by mouth daily. Before dentist appointments.   ASCORBIC ACID (VITAMIN C) 1000 MG TABLET    Take 1,000 mg by mouth daily.   ATORVASTATIN (LIPITOR) 80 MG TABLET    Take 1 tablet (80 mg total) by mouth daily.   CARBAMIDE PEROXIDE (DEBROX) 6.5 % OTIC SOLUTION    Place 5 drops into both ears 2 (two) times daily.   CARBOXYMETHYLCELLULOSE SOD PF 0.5 % SOLN    Place 1 drop into both eyes in the morning and at bedtime.   FINASTERIDE (PROSCAR) 5 MG TABLET    Take 5 mg by mouth daily.   FUROSEMIDE (LASIX) 20 MG TABLET    Take 1 tablet (20 mg total) by mouth daily. May also take an additional tablet as needed for swelling.   LORATADINE (CLARITIN) 10 MG TABLET    Take 10 mg by mouth daily as needed for allergies.   MELATONIN 5 MG TABS    Take 5 mg by mouth.   METOPROLOL TARTRATE (LOPRESSOR) 25 MG TABLET    TAKE 1 TABLET TWICE A DAY   MULTIPLE VITAMIN (MULTIVITAMIN ADULT PO)    Take 1 tablet by mouth daily. MATURE KIRKLAND   MULTIPLE VITAMINS-MINERALS (PRESERVISION AREDS 2 PO)    Take 1 tablet by mouth 2 (two) times daily.   PEGCETACOPLAN, OPHTHALMIC, (SYFOVRE) 15 MG/0.1ML SOLN    by Intravitreal route as directed. 1 injection into the eyes Every 6 weeks.   POLYVINYL ALCOHOL (LIQUIFILM TEARS) 1.4 % OPHTHALMIC SOLUTION    Place 1 drop into both eyes as needed for dry eyes.   STUDY - LIBREXIA-AF - APIXABAN 5 MG OR PLACEBO CAPSULE (PI-SETHI)    Take 1 capsule (5 mg total) by mouth 2 (two) times daily. Take at  approximately the same time of day with or without food. Please bring bottle back with you to every visit; do not discard bottle. (FOR INVESTIGATIONAL USE ONLY). Please contact [facility] for any questions or concerns regarding this medication.   STUDY - LIBREXIA-AF - [id number] (MILVEXIAN) 100 MG OR PLACEBO TABLET (PI-SETHI)    Take 1 tablet by mouth 2 (two) times daily. Take at approximately  the same time of day with or without food. Please bring bottle back with you to every visit; do not discard bottle. (FOR INVESTIGATIONAL USE ONLY). Please contact [facility] for any questions or concerns regarding this medication.   TADALAFIL (CIALIS) 5 MG TABLET    Take 5 mg by mouth daily.  Modified Medications   No medications on file  Discontinued Medications   No medications on file    Physical Exam:  Vitals:   12/30/22 1424  BP: 122/80  Pulse: 75  Resp: 14  Temp: 97.9 F (36.6 C)  SpO2: 97%  Weight: 162 lb 3.2 oz (73.6 kg)  Height: 5\' 7"  (1.702 m)   Body mass index is 25.4 kg/m. Wt Readings from Last 3 Encounters:  12/30/22 162 lb 3.2 oz (73.6 kg)  12/23/22 163 lb 9.6 oz (74.2 kg)  08/12/22 167 lb (75.8 kg)    Physical Exam Vitals reviewed.  HENT:     Right Ear: Tympanic membrane and ear canal normal. There is no impacted cerumen.     Left Ear: Tympanic membrane and ear canal normal. There is no impacted cerumen.     Ears:     Comments: Post lavage Cardiovascular:     Rate and Rhythm: Normal rate and regular rhythm.  Pulmonary:     Effort: Pulmonary effort is normal.     Breath sounds: Normal breath sounds.  Neurological:     General: No focal deficit present.     Mental Status: He is alert. Mental status is at baseline.  Psychiatric:        Mood and Affect: Mood normal.   Labs reviewed: Basic Metabolic Panel: Recent Labs    01/07/22 0241 01/07/22 0248 08/05/22 1354  NA 136 137 140  K 4.2 4.1 4.3  CL 104 105 106  CO2 22  --  26   GLUCOSE 100* 95 79  BUN 20 21 17   CREATININE 0.96 0.90 0.86  CALCIUM 9.3  --  8.8  TSH  --   --  3.31   Liver Function Tests: Recent Labs    01/07/22 0241 08/05/22 1354  AST 34 23  ALT 28 18  ALKPHOS 83  --   BILITOT 0.7 0.9  PROT 6.3* 5.9*  ALBUMIN 3.9  --    No results for input(s): "LIPASE", "AMYLASE" in the last 8760 hours. No results for input(s): "AMMONIA" in the last 8760 hours. CBC: Recent Labs    01/07/22 0241 01/07/22 0248 08/05/22 1354  WBC 6.6  --  8.0  NEUTROABS 3.0  --  5,528  HGB 14.7 14.3 14.3  HCT 44.3 42.0 42.9  MCV 103.0*  --  100.2*  PLT 147*  --  175   Lipid Panel: Recent Labs    01/07/22 0241  CHOL 92  HDL 58  LDLCALC 28  TRIG 32  CHOLHDL 1.6   TSH: Recent Labs    08/05/22 1354  TSH 3.31   A1C: Lab Results  Component Value Date   HGBA1C 5.1 01/07/2022     Assessment/Plan 1. Bilateral impacted cerumen - 09/12 right ear with sudden hearing loss - HOH normally  - ear lavage successful> tolerated well - recommend Debrox 5 gtts to both ears x 3 days once a month for wax prevention  Total time: 15 minutes. Greater than 50% of total time spent doing patient education regarding hearing loss and cerumen impaction including symptom/medication management.     Next appt: 02/03/2023  [name], Juel Burrow  [facility] [phone]

## 2023-01-14 ENCOUNTER — Ambulatory Visit: Payer: Medicare Other | Attending: Internal Medicine | Admitting: Internal Medicine

## 2023-01-14 ENCOUNTER — Encounter: Payer: Self-pay | Admitting: Internal Medicine

## 2023-01-14 VITALS — BP 108/74 | HR 61 | Ht 67.0 in | Wt 162.0 lb

## 2023-01-14 DIAGNOSIS — Z8679 Personal history of other diseases of the circulatory system: Secondary | ICD-10-CM | POA: Insufficient documentation

## 2023-01-14 DIAGNOSIS — I4819 Other persistent atrial fibrillation: Secondary | ICD-10-CM | POA: Insufficient documentation

## 2023-01-14 DIAGNOSIS — Z951 Presence of aortocoronary bypass graft: Secondary | ICD-10-CM | POA: Diagnosis present

## 2023-01-14 DIAGNOSIS — I071 Rheumatic tricuspid insufficiency: Secondary | ICD-10-CM | POA: Insufficient documentation

## 2023-01-14 DIAGNOSIS — Z9889 Other specified postprocedural states: Secondary | ICD-10-CM | POA: Insufficient documentation

## 2023-01-14 DIAGNOSIS — I680 Cerebral amyloid angiopathy: Secondary | ICD-10-CM | POA: Insufficient documentation

## 2023-01-14 DIAGNOSIS — E854 Organ-limited amyloidosis: Secondary | ICD-10-CM | POA: Insufficient documentation

## 2023-01-14 MED ORDER — FUROSEMIDE 20 MG PO TABS: 20.0000 mg | ORAL_TABLET | Freq: Every day | ORAL | 3 refills | Status: DC

## 2023-01-14 MED ORDER — METOPROLOL TARTRATE 25 MG PO TABS: 25.0000 mg | ORAL_TABLET | Freq: Two times a day (BID) | ORAL | 3 refills | Status: DC

## 2023-01-14 MED ORDER — ATORVASTATIN CALCIUM 80 MG PO TABS: 80.0000 mg | ORAL_TABLET | Freq: Every day | ORAL | 3 refills | Status: DC

## 2023-01-14 NOTE — Progress Notes (Signed)
OFFICE NOTE  Chief Complaint:  Follow-up  Primary Care Physician: Octavia Heir, NP  HPI:  Travis Brewer is a 88 y.o. male with a past medial history significant for recent diagnosis of ascending aortic aneurysm status post urgent elective surgery for aneurysm repair and possible replacement of a bicuspid aortic valve.  He underwent surgery at [facility] on 02/22/2019, undergoing an aneurysm replacement, resuspension of the aortic valve and CABG x2 with LIMA to LAD, SVG to D1.  Surgery was complicated by cardiac arrest that occurred shortly after sternotomy thought to be related to air embolism.  Ultimately the patient was successfully placed on bypass, the groin was explored and thought to be the source of air introduction during vein harvest.  Surgery then proceeded as scheduled however postoperatively was complicated by atrial fibrillation, 2 strokes, failure to wean off of the ventilator and ultimate placement of a trach and PEG.  He was subsequently [facility] in [place] and spent most of March 2021 there, ultimately eventually being weaned off of the ventilator and having his PEG tube reversed.  He presents today with his wife is also a new patient of mine to establish cardiology care.  He was seen in the hospital by Dr. Algie Coffer, one of the local cardiologist, who had added digoxin for aid in A. fib rate control as well as possible RV inotropic stimulation.  He noted that the patient's chads vas score was 5 however anticoagulation was not recommended.  In fact I cannot understand why the patient has not been anticoagulated as there is no history of significant contraindication to this, but he did have 2 strokes and has had what appears to be persistent atrial fibrillation since surgery.  There is no evidence of left atrial appendage closure in fact it seemed that the surgery went pretty emergently after his arrest.  Recently Travis Brewer has been  complaining of positional dizziness, especially when bending over and sitting up too quickly or turning over to one side.  Blood pressure today was noted to be low at 104/72.  EKG showed that he is in A. fib with controlled ventricular response at 70-personally reviewed.  Other than this he reports he is slowly getting back to somewhat normal activities.  He did undergo some speech therapy.  He had an echo that was performed prior to bypass which showed normal LVEF and a subsequent echo in January which showed preserved LV function.  He denies any MI during the hospitalization.  He has no chest pain or shortness of breath.  He reports fatigue but it is difficult to know whether this is related to his A. fib.  The last echo in January while showing normal LVEF did show moderate to severe biatrial enlargement, suggesting that his A. fib may be challenging to rhythm control.  12/25/2019  Travis Brewer returns today for follow-up.  He recently underwent uncomplicated mesh hernia repair by Dr. Corliss Skains which went very well.  Apparently was discharged same day.  He has done well recovering.  He is interested in getting reestablished with some physical therapy to improve his strength.  He did undergo a repeat EF 60 to 65% without regional wall motion abnormalities however there are signs of right ventricular volume overload with mildly elevated pulmonary artery systolic pressure and RVSP of 38 mmHg.  The left atrium is mild to moderately dilated and the right atrium is severely dilated.  There is mild to moderate MR and  severe TR.  There is mild AI.  The aortic root is dilated at 42 mm without evidence of a sending aortic dilatation.  The ascending aorta as mentioned was grafted.  The IVC was dilated with elevated right atrial pressure 15 mmHg.  Despite these echo findings, Travis Brewer says he feels well.  He denies any shortness of breath with exertion, chest pain or associated symptoms.  He is not currently anticoagulated.  We  previously discussed going on Eliquis for anticoagulation however he is concerned about the risk for intracranial bleeding.  He remains on low-dose aspirin however this is not sufficient to protect him from stroke.  We discussed alternatives to anticoagulation including possible left atrial appendage occlusion with a watchman device, however he wished to consider that further and is not interested in anticoagulation at this time.  03/10/2020  Travis Brewer is seen today in follow-up.  Echo in September showed normal LVEF 60 to 65% with signs of RV volume overload.  There was only mild pulmonary hypertension with an RVSP of 38 mmHg.  There was mild to moderate left atrial enlargement and severe right atrial enlargement with mild to moderate MR and severe TR.  The aortic root was dilated at 4.2 cm.  He continues to have no complaints of worsening shortness of breath.  He was noted to have mildly elevated BNP and I had recommended increasing his Lasix up to 40 mg daily.  He seems to be tolerating this and had no significant change in his creatinine with the increase in the Lasix.  We again discussed the fact that he is not anticoagulated.  He is in persistent if not longstanding persistent atrial fibrillation.  He had concerns about anticoagulation and is not willing to take it due to his prior risk of intracranial bleeding.  We again discussed the watchman procedure and he is now interested in further evaluation.  Also, his wife who was at the visit today mentioned that he had been noticed to have witnessed episodes of apnea.  It also sounds like possibly a Cheyne Stokes pattern of respiration where she said after about every 8 or 9 breaths he had stopped breathing.  Reportedly he had a sleep study in [place] but never got the results of that.  11/25/2020  Travis Brewer returns today for follow-up.  He was referred for watchman left atrial appendage occlusion device.  Unfortunately after 2 attempts this could not be  successfully placed and the procedure was aborted.  He has seen neurology and because of his cerebral amyloid angiopathy, he is at high risk for bleeding on anticoagulation and therefore will remain on low-dose aspirin.  He is in likely permanent atrial fibrillation at this point.  He is asymptomatic with it and rate controlled.  He exercises regularly and says he is improving daily with regards to physical therapy.  11/27/2021  Mr. Hyder returns today for follow-up.  Overall he seems to be doing well.  He is still concerned however about his risk of stroke since he cannot be anticoagulated due to cerebral amyloid angiopathy.  He has some questions about this diagnosis and whether truly it would be a high risk of life-threatening bleeding if he were to be anticoagulated.  He is considering a second opinion.  Unfortunately as mentioned previously a significant attempt to place a Watchman left atrial appendage occluder device was unsuccessful.  01/14/2023  Mr. Ramson is seen today in follow-up.  He continues to feel well.  He is not aware of  his atrial fibrillation.  He has subsequently been involved in the research trial for milvexian, which is an alternative to the anticoagulants with presumed lower bleeding risk.  It is randomized.  Other than that he feels well.  Denies any chest pain or shortness of breath.  His blood pressure is well-controlled.  He has established with [employer].  He is happy with this.  PMHx:  Past Medical History:  Diagnosis Date   Acute on chronic respiratory failure with hypoxia (HCC)    Bone spur    Per [facility] new patient packet   BPH (benign prostatic hyperplasia)    CAD (coronary artery disease)    Cancer (HCC)    Melonoma on left shoulder blade removed   Cerebral amyloid angiopathy (HCC)    Chronic atrial fibrillation (HCC)    Chronic congestive heart failure with left ventricular diastolic dysfunction (HCC)    Diaphragmatic paralysis    Dyspnea     Dysrhythmia    ED (erectile dysfunction)    Foot drop    Per [facility] new patient packet   Ganglion cyst    Per [facility] new patient packet   Hepatitis    Hep A   History of DVT of lower extremity    BLE DVT ~ 04/2019   Hyperplasia of prostate    Per [facility] new patient packet   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 He has never used smokeless tobacco. He reports current alcohol use of about 1.0 - 2.0 standard drink of alcohol per week. He reports that he does not use drugs.  ALLERGIES:  Allergies  Allergen Reactions   Quetiapine Nausea And Vomiting    When taken with clonazepam (seprately they are fine)   Clonazepam Nausea And Vomiting    When taken with quetiapine (seprately they are fine)    ROS: Pertinent items noted in HPI and remainder of comprehensive ROS otherwise negative.  HOME MEDS: Current Outpatient Medications on File Prior to Visit  Medication Sig Dispense Refill   AMOXICILLIN PO Take 1 tablet by mouth daily. Before dentist appointments.     Ascorbic Acid (VITAMIN C) 1000 MG tablet Take 1,000 mg by mouth daily.     atorvastatin (LIPITOR) 80 MG tablet Take 1 tablet (80 mg total) by mouth daily. 90 tablet 2   carbamide peroxide (DEBROX) 6.5 % OTIC solution Place 5 drops into both ears 2 (two) times daily.     Carboxymethylcellulose Sod PF 0.5 % SOLN Place 1 drop into both eyes in the morning and at bedtime.     finasteride (PROSCAR) 5 MG tablet Take 5 mg by mouth daily.     furosemide (LASIX) 20 MG tablet Take 1 tablet (20 mg total) by mouth daily. May also take an additional tablet as needed for swelling. 90 tablet 0   loratadine (CLARITIN)  10 MG tablet Take 10 mg by mouth daily as needed for allergies.     melatonin 5 MG TABS Take 5 mg  by mouth.     metoprolol tartrate (LOPRESSOR) 25 MG tablet TAKE 1 TABLET TWICE A DAY 60 tablet 2   Multiple Vitamin (MULTIVITAMIN ADULT PO) Take 1 tablet by mouth daily. MATURE KIRKLAND     Multiple Vitamins-Minerals (PRESERVISION AREDS 2 PO) Take 1 tablet by mouth 2 (two) times daily.     Pegcetacoplan, Ophthalmic, (SYFOVRE) 15 MG/0.1ML SOLN by Intravitreal route as directed. 1 injection into the eyes Every 6 weeks.     polyvinyl alcohol (LIQUIFILM TEARS) 1.4 % ophthalmic solution Place 1 drop into both eyes as needed for dry eyes. 15 mL 0   STUDY - LIBREXIA-AF - apixaban 5 mg or placebo capsule (PI-Sethi) Take 1 capsule (5 mg total) by mouth 2 (two) times daily. Take at approximately the same time of day with or without food. Please bring bottle back with you to every visit; do not discard bottle. (FOR INVESTIGATIONAL USE ONLY). Please contact [facility] for any questions or concerns regarding this medication. 280 capsule 0   STUDY - LIBREXIA-AF - [id number] (Milvexian) 100 mg or placebo tablet (PI-Sethi) Take 1 tablet by mouth 2 (two) times daily. Take at approximately the same time of day with or without food. Please bring bottle back with you to every visit; do not discard bottle. (FOR INVESTIGATIONAL USE ONLY). Please contact [facility] for any questions or concerns regarding this medication. 280 tablet 0   tadalafil (CIALIS) 5 MG tablet Take 5 mg by mouth daily.     No current facility-administered medications on file prior to visit.    LABS/IMAGING: No results found for this or any previous visit (from the past 48 hour(s)). No results found.  LIPID PANEL:    Component Value Date/Time   CHOL 92 01/07/2022 0241   TRIG 32 01/07/2022 0241   HDL 58 01/07/2022 0241   CHOLHDL 1.6 01/07/2022 0241   VLDL 6 01/07/2022 0241   LDLCALC 28 01/07/2022 0241      WEIGHTS: Wt Readings from Last 3 Encounters:  01/14/23 162 lb (73.5 kg)  12/30/22 162 lb 3.2 oz (73.6 kg)  12/23/22 163 lb 9.6 oz (74.2 kg)    VITALS: BP 108/74   Pulse 61   Ht 5\' 7"  (1.702 m)   Wt 162 lb (73.5 kg)   BMI 25.37 kg/m   EXAM: General appearance: alert, no distress and pale Neck: no carotid bruit, no JVD and thyroid not enlarged, symmetric, no tenderness/mass/nodules Lungs: clear to auscultation bilaterally Heart: irregularly irregular rhythm Abdomen: soft, non-tender; bowel sounds normal; no masses,  no organomegaly Extremities: extremities normal, atraumatic, no cyanosis or edema Pulses: 2+ and symmetric Skin: Skin color, texture, turgor normal. No rashes or lesions Neurologic: Grossly normal Psych: Pleasant  EKG: EKG Interpretation Date/Time:  Friday January 14 2023 13:33:51 EDT Ventricular Rate:  61 PR Interval:    QRS Duration:  96 QT Interval:  432 QTC Calculation: 434 R Axis:   69  Text Interpretation: Atrial fibrillation Nonspecific T wave abnormality When compared with ECG of 07-Jan-2022 03:14, PREVIOUS ECG IS PRESENT No significant change since last tracing Confirmed by Zoila Shutter [id number]) on 01/14/2023 1:39:30 PM    ASSESSMENT: Status post ascending aneurysm replacement (02/2019-[facility]) CAD status post CABG x2 (LIMA to LAD, SVG to D1)-11/20, [facility] Intraprocedural cardiac arrest secondary to possible air embolus Prolonged ventilation necessitating trach and PEG[facility] Longstanding persistent atrial fibrillation postoperative with moderate to severe biatrial enlargement normal LV function by  echo in January 2021, mild to moderate RV hypokinesis, CHADSVASC Score of 5 -not anticoagulated due to concern for intracranial bleeding risk. Dyslipidemia BPH ED LVEF 60 to 65%, mild to moderate MR, severe TR (12/2019) Cerebral amyloid angiopathy  PLAN: 1.   Mr. Lemming seems to  be doing well without any anginal symptoms.  He has not had any worsening A-fib control.  He is in a clinical research trial with an anticoagulant which is supposed to be lower bleeding risk and possibly tolerable with his cerebral amyloid angiopathy.  No changes to his medicines today.  Follow-up with me annually or sooner as necessary.  [name], MD, [facility], FACP  [place]  [facility]  Medical Director of the Advanced Lipid Disorders &  Cardiovascular Risk Reduction Clinic Diplomate of the American Board of Clinical Lipidology Attending Cardiologist  Direct Dial: [phone]  Fax: [phone]  Website:  [email or web address] Nicely Franziska Podgurski 01/14/2023, 1:39 PM

## 2023-01-14 NOTE — Patient Instructions (Signed)
Medication Instructions:  NO CHANGES Refills for cardiac meds sent to [facility]  *If you need a refill on your cardiac medications before your next appointment, please call your pharmacy*   Follow-Up: At [facility], you and your health needs are our priority.  As part of our continuing mission to provide you with exceptional heart care, we have created designated Provider Care Teams.  These Care Teams include your primary Cardiologist (physician) and Advanced Practice Providers (APPs -  Physician Assistants and Nurse Practitioners) who all work together to provide you with the care you need, when you need it.  We recommend signing up for the patient portal called "MyChart".  Sign up information is provided on this After Visit Summary.  MyChart is used to connect with patients for Virtual Visits (Telemedicine).  Patients are able to view lab/test results, encounter notes, upcoming appointments, etc.  Non-urgent messages can be sent to your provider as well.   To learn more about what you can do with MyChart, go to [email or web address].    Your next appointment:   12 months with Dr. Rennis Golden

## 2023-02-03 ENCOUNTER — Ambulatory Visit: Payer: Medicare Other | Admitting: Orthopedic Surgery

## 2023-02-17 ENCOUNTER — Encounter: Payer: Self-pay | Admitting: Orthopedic Surgery

## 2023-02-17 ENCOUNTER — Ambulatory Visit (INDEPENDENT_AMBULATORY_CARE_PROVIDER_SITE_OTHER): Payer: Medicare Other | Admitting: Orthopedic Surgery

## 2023-02-17 VITALS — BP 110/70 | HR 60 | Temp 96.6°F | Resp 16 | Ht 67.0 in | Wt 156.6 lb

## 2023-02-17 DIAGNOSIS — N401 Enlarged prostate with lower urinary tract symptoms: Secondary | ICD-10-CM | POA: Diagnosis not present

## 2023-02-17 DIAGNOSIS — R053 Chronic cough: Secondary | ICD-10-CM

## 2023-02-17 DIAGNOSIS — I5032 Chronic diastolic (congestive) heart failure: Secondary | ICD-10-CM | POA: Diagnosis not present

## 2023-02-17 DIAGNOSIS — R350 Frequency of micturition: Secondary | ICD-10-CM

## 2023-02-17 DIAGNOSIS — I4819 Other persistent atrial fibrillation: Secondary | ICD-10-CM

## 2023-02-17 MED ORDER — OMEPRAZOLE 20 MG PO CPDR: 20.0000 mg | DELAYED_RELEASE_CAPSULE | Freq: Every day | ORAL | 3 refills | Status: DC

## 2023-02-17 NOTE — Patient Instructions (Addendum)
Try omeprazole 20 mg daily for chronic cough> take on empty stomach> may increase to 40 mg if medication helps but not to full effect

## 2023-02-17 NOTE — Progress Notes (Signed)
Careteam: Patient Care Team: Octavia Heir, NP as PCP - General (Adult Health Nurse Practitioner) [name], MD as PCP - Cardiology (Cardiology) [name], MD as Surgeon (General Surgery) [name] (Dermatology) [name], MD as Consulting Physician (Ophthalmology) [name], MD as Consulting Physician (Neurology) [name], MD (Inactive) as Consulting Physician (Urology) [name], MD as Consulting Physician (Ophthalmology)  Seen by: [name], AGNP-C  PLACE OF SERVICE:  [facility]  Advanced Directive information Does Patient Have a Medical Advance Directive?: Yes, Type of Advance Directive: Healthcare Power of [place];Living will, Does patient want to make changes to medical advance directive?: No - Patient declined  Allergies  Allergen Reactions   Quetiapine Nausea And Vomiting    When taken with clonazepam (seprately they are fine)   Clonazepam Nausea And Vomiting    When taken with quetiapine (seprately they are fine)    Chief Complaint  Patient presents with   Medical Management of Chronic Issues    6 month follow up.     HPI: Patient is a 87 y.o. male seen today for medical management of chronic conditions.   No health concerns today. Fasting for blood work today.   Continue to have chronic cough with throat clearing. Worse in AM. Mild sputum production. Xyzal did help symptoms some. He has been on omeprazole in past. He is willing to try again.   CHF- LVEF 60-65%. No weight fluctuations, sob, ankle edema. Remain on furosemide  PAF- HR controlled with metoprolol, not on anticoagulation due to intracranial risk.   Hearing improved with Debrox use/ ear lavage. Continues to use hearing aids.   BPH- followed by [facility], remains on finasteride.      Review of Systems:  Review of Systems  Constitutional: Negative.   HENT:  Positive for hearing loss.   Eyes: Negative.   Respiratory:   Positive for cough and shortness of breath. Negative for wheezing.   Cardiovascular: Negative.   Gastrointestinal:  Positive for heartburn.  Genitourinary:  Positive for frequency.  Musculoskeletal:  Negative for falls and joint pain.  Skin: Negative.   Neurological: Negative.   Psychiatric/Behavioral:  Positive for memory loss.     Past Medical History:  Diagnosis Date   Acute on chronic respiratory failure with hypoxia (HCC)    Bone spur    Per [facility] new patient packet   BPH (benign prostatic hyperplasia)    CAD (coronary artery disease)    Cancer (HCC)    Melonoma on left shoulder blade removed   Cerebral amyloid angiopathy (HCC)    Chronic atrial fibrillation (HCC)    Chronic congestive heart failure with left ventricular diastolic dysfunction (HCC)    Diaphragmatic paralysis    Dyspnea    Dysrhythmia    ED (erectile dysfunction)    Foot drop    Per [facility] new patient packet   Ganglion cyst    Per [facility] new patient packet   Hepatitis    Hep A   History of DVT of lower extremity    BLE DVT ~ 04/2019   Hyperplasia of prostate    Per [facility] new patient packet   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, Per [facility] new patient packet   Pneumonia    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   STROKE X 2   Past Surgical History:  Procedure Laterality Date   ASCENDING AORTIC ANEURYSM REPAIR  02/22/2019   32 mm Hemashild graft, STJ  to ascending interposition graft, resuspension of AV ([facility] [place])   BACK SURGERY     CARDIAC CATHETERIZATION  02/15/2019   [facility]   CORONARY ARTERY BYPASS GRAFT  02/22/2019   LIMA to LAD, SVG to D1 [facility])   EYE SURGERY Bilateral    Cataract surgery   INGUINAL HERNIA REPAIR Right 11/14/2019   Procedure: RIGHT INGUINAL HERNIA REPAIR WITH MESH;  Surgeon: [name], MD;  Location: [facility] OR;  Service: General;  Laterality: Right;  LMA AND TAP BLOCK   JOINT REPLACEMENT Bilateral    knee replacement   L3-L4 Back surgery     LEFT  ATRIAL APPENDAGE OCCLUSION N/A 05/29/2020   Procedure: LEFT ATRIAL APPENDAGE OCCLUSION;  Surgeon: [name], MD;  Location: [facility] INVASIVE CV LAB;  Service: Cardiovascular;  Laterality: N/A;   PEG TUBE PLACEMENT     sciatic nerve cyst removal     TEE WITHOUT CARDIOVERSION N/A 05/29/2020   Procedure: TRANSESOPHAGEAL ECHOCARDIOGRAM (TEE);  Surgeon: [name], MD;  Location: [facility] INVASIVE CV LAB;  Service: Cardiovascular;  Laterality: N/A;   TONSILLECTOMY     TRACHEOSTOMY     Social History:   reports that he has never smoked. He has never used smokeless tobacco. He reports current alcohol use of about 1.0 - 2.0 standard drink of alcohol per week. He reports that he does not use drugs.  Family History  Problem Relation Age of Onset   Diabetes Mother    Dementia Mother        Per [facility] new patient packet   Stroke Father    Diabetes Brother        Per [facility] new patient packet    Medications: Patient's Medications  New Prescriptions   No medications on file  Previous Medications   AMOXICILLIN PO    Take 1 tablet by mouth daily. Before dentist appointments.   ASCORBIC ACID (VITAMIN C) 1000 MG TABLET    Take 1,000 mg by mouth daily.   ATORVASTATIN (LIPITOR) 80 MG TABLET    Take 1 tablet (80 mg total) by mouth daily.   CARBAMIDE PEROXIDE (DEBROX) 6.5 % OTIC SOLUTION    Place 5 drops into both ears 2 (two) times daily.   CARBOXYMETHYLCELLULOSE SOD PF 0.5 % SOLN    Place 1 drop into both eyes in the morning and at bedtime.   FINASTERIDE (PROSCAR) 5 MG TABLET    Take 5 mg by mouth daily.   FUROSEMIDE (LASIX) 20 MG TABLET    Take 1 tablet (20 mg total) by mouth daily. May also take an additional tablet as needed for swelling.   LEVOCETIRIZINE DIHYDROCHLORIDE (XYZAL PO)    Take 2 tablets by mouth as needed.   MELATONIN 5 MG TABS    Take 5 mg by mouth.   METOPROLOL TARTRATE (LOPRESSOR) 25 MG TABLET    Take 1 tablet (25 mg total) by mouth 2 (two) times daily.   MULTIPLE VITAMIN (MULTIVITAMIN  ADULT PO)    Take 1 tablet by mouth daily. MATURE KIRKLAND   MULTIPLE VITAMINS-MINERALS (PRESERVISION AREDS 2 PO)    Take 1 tablet by mouth 2 (two) times daily.   PEGCETACOPLAN, OPHTHALMIC, (SYFOVRE) 15 MG/0.1ML SOLN    by Intravitreal route as directed. 1 injection into the eyes Every 6 weeks.   POLYVINYL ALCOHOL (LIQUIFILM TEARS) 1.4 % OPHTHALMIC SOLUTION    Place 1 drop into both eyes as needed for dry eyes.   STUDY - LIBREXIA-AF - APIXABAN 5 MG OR  PLACEBO CAPSULE (PI-SETHI)    Take 1 capsule (5 mg total) by mouth 2 (two) times daily. Take at approximately the same time of day with or without food. Please bring bottle back with you to every visit; do not discard bottle. (FOR INVESTIGATIONAL USE ONLY). Please contact [facility] for any questions or concerns regarding this medication.   STUDY - LIBREXIA-AF - [id number] (MILVEXIAN) 100 MG OR PLACEBO TABLET (PI-SETHI)    Take 1 tablet by mouth 2 (two) times daily. Take at approximately the same time of day with or without food. Please bring bottle back with you to every visit; do not discard bottle. (FOR INVESTIGATIONAL USE ONLY). Please contact [facility] for any questions or concerns regarding this medication.   TADALAFIL (CIALIS) 5 MG TABLET    Take 5 mg by mouth daily.  Modified Medications   No medications on file  Discontinued Medications   LORATADINE (CLARITIN) 10 MG TABLET    Take 10 mg by mouth daily as needed for allergies.    Physical Exam:  Vitals:   02/17/23 1022  BP: 110/70  Pulse: 60  Resp: 16  Temp: (!) 96.6 F (35.9 C)  SpO2: 96%  Weight: 156 lb 9.6 oz (71 kg)  Height: 5\' 7"  (1.702 m)   Body mass index is 24.53 kg/m. Wt Readings from Last 3 Encounters:  02/17/23 156 lb 9.6 oz (71 kg)  01/14/23 162 lb (73.5 kg)  12/30/22 162 lb 3.2 oz (73.6 kg)    Physical Exam Vitals reviewed.  Constitutional:      General: He is not in acute distress. HENT:     Head: Normocephalic.  Eyes:      General:        Right eye: No discharge.        Left eye: No discharge.  Cardiovascular:     Rate and Rhythm: Normal rate. Rhythm irregular.     Pulses: Normal pulses.     Heart sounds: Normal heart sounds.  Pulmonary:     Effort: Pulmonary effort is normal. No respiratory distress.     Breath sounds: Normal breath sounds. No wheezing or rales.  Abdominal:     General: Bowel sounds are normal.     Palpations: Abdomen is soft.  Musculoskeletal:     Cervical back: Neck supple.     Right lower leg: No edema.     Left lower leg: No edema.  Skin:    General: Skin is warm.     Capillary Refill: Capillary refill takes less than 2 seconds.  Neurological:     General: No focal deficit present.     Mental Status: He is alert and oriented to person, place, and time.     Motor: No weakness.     Gait: Gait normal.  Psychiatric:        Mood and Affect: Mood normal.     Labs reviewed: Basic Metabolic Panel: Recent Labs    08/05/22 1354  NA 140  K 4.3  CL 106  CO2 26  GLUCOSE 79  BUN 17  CREATININE 0.86  CALCIUM 8.8  TSH 3.31   Liver Function Tests: Recent Labs    08/05/22 1354  AST 23  ALT 18  BILITOT 0.9  PROT 5.9*   No results for input(s): "LIPASE", "AMYLASE" in the last 8760 hours. No results for input(s): "AMMONIA" in the last 8760 hours. CBC: Recent Labs    08/05/22 1354  WBC 8.0  NEUTROABS 5,528  HGB  14.3  HCT 42.9  MCV 100.2*  PLT 175   Lipid Panel: No results for input(s): "CHOL", "HDL", "LDLCALC", "TRIG", "CHOLHDL", "LDLDIRECT" in the last 8760 hours. TSH: Recent Labs    08/05/22 1354  TSH 3.31   A1C: Lab Results  Component Value Date   HGBA1C 5.1 01/07/2022     Assessment/Plan 1. Chronic cough - ongoing - worse in AM - improved with xyzal - ? Indigestion - will try omeprazole  - if not improvement consider CXR - omeprazole (PRILOSEC) 20 MG capsule; Take 1 capsule (20 mg total) by mouth daily.  Dispense: 30 capsule; Refill:  3  2. Chronic congestive heart failure with left ventricular diastolic dysfunction (HCC) - followed by cardiology - compensated - LVEF 60-65% 2023 - cont furosemide - Complete Metabolic Panel with eGFR - Lipid Panel  3. Persistent atrial fibrillation (HCC) - HR< 100 with metoprolol - not on anticoagulation due to intracranial risk - CBC with Differential/Platelet - TSH  4. Benign prostatic hyperplasia with urinary frequency - followed by [facility] - cont finasteride   Total time: 34 minutes. Greater than 50% of total time spent doing patient education regarding health maintenance, couch, CHF, PAF, and BPH including symptom/medication management.     Next appt: Visit date not found  Yashika Mask Scherry Ran  [facility] & Adult Medicine [phone]

## 2023-02-18 LAB — LIPID PANEL
Cholesterol: 106 mg/dL (ref <200)
HDL: 73 mg/dL (ref >=40)
LDL Cholesterol (Calc): 22 mg/dL
Non-HDL Cholesterol (Calc): 33 mg/dL (ref <130)
Total CHOL/HDL Ratio: 1.5 (calc) (ref <5.0)
Triglycerides: 39 mg/dL (ref <150)

## 2023-02-18 LAB — CBC WITH DIFFERENTIAL/PLATELET
Absolute Lymphocytes: 1652 {cells}/uL (ref 850–3900)
Absolute Monocytes: 561 {cells}/uL (ref 200–950)
Basophils Absolute: 30 {cells}/uL (ref 0–200)
Basophils Relative: 0.5 %
Eosinophils Absolute: 171 {cells}/uL (ref 15–500)
Eosinophils Relative: 2.9 %
HCT: 44.9 % (ref 38.5–50.0)
Hemoglobin: 14.9 g/dL (ref 13.2–17.1)
MCH: 33.7 pg — ABNORMAL HIGH (ref 27.0–33.0)
MCHC: 33.2 g/dL (ref 32.0–36.0)
MCV: 101.6 fL — ABNORMAL HIGH (ref 80.0–100.0)
MPV: 12.1 fL (ref 7.5–12.5)
Monocytes Relative: 9.5 %
Neutro Abs: 3487 {cells}/uL (ref 1500–7800)
Neutrophils Relative %: 59.1 %
Platelets: 165 10*3/uL (ref 140–400)
RBC: 4.42 10*6/uL (ref 4.20–5.80)
RDW: 11.8 % (ref 11.0–15.0)
Total Lymphocyte: 28 %
WBC: 5.9 10*3/uL (ref 3.8–10.8)

## 2023-02-18 LAB — COMPLETE METABOLIC PANEL WITH GFR
AG Ratio: 2.2 (calc) (ref 1.0–2.5)
ALT: 24 U/L (ref 9–46)
AST: 35 U/L (ref 10–35)
Albumin: 4.6 g/dL (ref 3.6–5.1)
Alkaline phosphatase (APISO): 72 U/L (ref 35–144)
BUN: 19 mg/dL (ref 7–25)
CO2: 28 mmol/L (ref 20–32)
Calcium: 9.4 mg/dL (ref 8.6–10.3)
Chloride: 102 mmol/L (ref 98–110)
Creat: 1 mg/dL (ref 0.70–1.22)
Globulin: 2.1 g/dL (ref 1.9–3.7)
Glucose, Bld: 94 mg/dL (ref 65–99)
Potassium: 4.4 mmol/L (ref 3.5–5.3)
Sodium: 139 mmol/L (ref 135–146)
Total Bilirubin: 1.4 mg/dL — ABNORMAL HIGH (ref 0.2–1.2)
Total Protein: 6.7 g/dL (ref 6.1–8.1)
eGFR: 73 mL/min/{1.73_m2} (ref >=60)

## 2023-02-18 LAB — TSH: TSH: 2.72 m[IU]/L (ref 0.40–4.50)

## 2023-04-11 ENCOUNTER — Telehealth: Payer: Self-pay | Admitting: Internal Medicine

## 2023-04-11 MED ORDER — ATORVASTATIN CALCIUM 80 MG PO TABS: 80.0000 mg | ORAL_TABLET | Freq: Every day | ORAL | 2 refills | Status: DC

## 2023-04-11 NOTE — Telephone Encounter (Signed)
*  STAT* If patient is at the pharmacy, call can be transferred to refill team.   1. Which medications need to be refilled? (please list name of each medication and dose if known) Atorvastatin- need a new prescription- changing pharmacy for all of his medicine   2. Would you like to learn more about the convenience, safety, & potential cost savings by using the [facility]?   3. Are you open to using the [facility] (Type [facility].   4. Which pharmacy/location (including street and city if local pharmacy) is medication to be sent to?[facility]  [address], [place]   5. Do they need a 30 day or 90 day supply? 90 days and refills

## 2023-04-11 NOTE — Telephone Encounter (Signed)
Pt's medication was sent to pt's pharmacy as requested. Confirmation received.  °

## 2023-07-26 ENCOUNTER — Other Ambulatory Visit (HOSPITAL_COMMUNITY): Payer: Self-pay | Admitting: Pharmacist

## 2023-07-26 MED ORDER — STUDY - LIBREXIA-AF - APIXABAN 5 MG OR PLACEBO CAPSULE (PI-SETHI): 5.0000 mg | ORAL_CAPSULE | Freq: Two times a day (BID) | ORAL | 0 refills | Status: DC

## 2023-07-26 MED ORDER — STUDY - LIBREXIA-AF - JNJ-70033093 (MILVEXIAN) 100 MG OR PLACEBO (PI-SETHI): 1.0000 | ORAL_TABLET | Freq: Two times a day (BID) | ORAL | 0 refills | Status: DC

## 2023-08-18 ENCOUNTER — Ambulatory Visit: Payer: Medicare Other | Admitting: Orthopedic Surgery

## 2023-08-18 ENCOUNTER — Encounter: Payer: Self-pay | Admitting: Orthopedic Surgery

## 2023-08-18 VITALS — BP 124/76 | HR 63 | Temp 96.3°F | Resp 16 | Ht 67.0 in | Wt 162.0 lb

## 2023-08-18 DIAGNOSIS — I4819 Other persistent atrial fibrillation: Secondary | ICD-10-CM | POA: Diagnosis not present

## 2023-08-18 DIAGNOSIS — H6122 Impacted cerumen, left ear: Secondary | ICD-10-CM

## 2023-08-18 DIAGNOSIS — R053 Chronic cough: Secondary | ICD-10-CM | POA: Diagnosis not present

## 2023-08-18 DIAGNOSIS — N401 Enlarged prostate with lower urinary tract symptoms: Secondary | ICD-10-CM

## 2023-08-18 DIAGNOSIS — I5032 Chronic diastolic (congestive) heart failure: Secondary | ICD-10-CM

## 2023-08-18 DIAGNOSIS — R350 Frequency of micturition: Secondary | ICD-10-CM

## 2023-08-18 DIAGNOSIS — R413 Other amnesia: Secondary | ICD-10-CM

## 2023-08-18 DIAGNOSIS — T162XXA Foreign body in left ear, initial encounter: Secondary | ICD-10-CM

## 2023-08-18 NOTE — Patient Instructions (Addendum)
 Please schedule with Dr. Maximo Spar  Try tylenol  1000 mg> up to 3 times daily for back pain  Try voltaren gel to back up tot 3 times daily  Recommend ice to lower back pain> 20 minute intervals> as many times daily  Avoid bending, lifting and twisting mechanics until back pain improved   Recommend left ear> 5 gtts of Debrox daily x 5 days > flush ears in shower > may use cotton ball after administering drops

## 2023-08-18 NOTE — Progress Notes (Signed)
 Careteam: Patient Care Team: Arnetha Bhat, NP as PCP - General (Adult Health Nurse Practitioner) Hazle Lites, MD as PCP - Cardiology (Cardiology) Arlean Bellow, MD as Surgeon (General Surgery) [facility] (Dermatology) Alvina Axon, MD as Consulting Physician (Ophthalmology) Lisabeth Rider, MD as Consulting Physician (Neurology) Sherlyn Ditto, MD (Inactive) as Consulting Physician (Urology) Seward Dao Alleen Arbour, MD as Consulting Physician (Ophthalmology)  Seen by: Ulyses Gandy, AGNP-C  PLACE OF SERVICE:  [facility]  Advanced Directive information Does Patient Have a Medical Advance Directive?: No, Would patient like information on creating a medical advance directive?: No - Patient declined  Allergies  Allergen Reactions   Quetiapine Nausea And Vomiting    When taken with clonazepam (seprately they are fine)   Clonazepam Nausea And Vomiting    When taken with quetiapine (seprately they are fine)    Chief Complaint  Patient presents with   Medical Management of Chronic Issues    6 month follow up. Discuss the need for Covid Booster.      HPI: Patient is a 89 y.o. male seen today for medical management of chronic conditions.   Discussed the use of AI scribe software for clinical note transcription with the patient, who gave verbal consent to proceed.  History of Present Illness    He has a history of persistent cough, previously attributed to allergies, and has been managing it with Zyrtec, which he plans to continue until the end of May and then resume in September. He previously used Xyzal during pollen season and noted improvement. He is no longer taking omeprazole  as heartburn was considered a potential cause of the cough.  Followed by cardiology for CHF and Afib. LVEF 60-65% 11/2021. No weight gain, sob or ankle edema. Remains on furosemide  and metoprolol . He is involved in a medical trial of Librexia Afib-stroke prevention trial, off Eliquis   at this time.   He is concerned about earwax buildup and mentions not being consistent with ear drops. He recalls a previous visit where a significant amount of wax was removed, improving his hearing. Recently, he lost a piece of his hearing aid in left ear, present on ear exam today.   He has been experiencing lumbar pain on the left side for the past week, which he associates with his workout routine. He exercises three times a week and suspects a particular machine might be contributing to the pain. He has not used Tylenol  yet but is considering it for pain management. He is aware of his limitations with NSAIDs due to his atrial fibrillation.        Review of Systems:  Review of Systems  Constitutional: Negative.   HENT:  Positive for congestion and hearing loss. Negative for ear discharge, ear pain and tinnitus.   Eyes: Negative.   Respiratory:  Negative for shortness of breath and wheezing.   Cardiovascular:  Negative for chest pain and leg swelling.  Gastrointestinal: Negative.   Genitourinary: Negative.   Musculoskeletal:  Positive for back pain. Negative for falls.  Skin: Negative.   Neurological: Negative.   Psychiatric/Behavioral:  Negative for depression. The patient is not nervous/anxious.     Past Medical History:  Diagnosis Date   Acute on chronic respiratory failure with hypoxia (HCC)    Bone spur    Per [facility] new patient packet   BPH (benign prostatic hyperplasia)    CAD (coronary artery disease)    Cancer (HCC)    Melonoma on left shoulder  blade removed   Cerebral amyloid angiopathy (HCC)    Chronic atrial fibrillation (HCC)    Chronic congestive heart failure with left ventricular diastolic dysfunction (HCC)    Diaphragmatic paralysis    Dyspnea    Dysrhythmia    ED (erectile dysfunction)    Foot drop    Per [facility] new patient packet   Ganglion cyst    Per [facility] new patient packet   Hepatitis    Hep A   History of DVT of lower extremity    BLE DVT ~ 04/2019    Hyperplasia of prostate    Per [facility] new patient packet   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, Per [facility] new patient packet   Pneumonia    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   STROKE X 2   Past Surgical History:  Procedure Laterality Date   ASCENDING AORTIC ANEURYSM REPAIR  02/22/2019   32 mm Hemashild graft, STJ to ascending interposition graft, resuspension of AV ([facility] [place])   BACK SURGERY     CARDIAC CATHETERIZATION  02/15/2019   [facility]   CORONARY ARTERY BYPASS GRAFT  02/22/2019   LIMA to LAD, SVG to D1 [facility])   EYE SURGERY Bilateral    Cataract surgery   INGUINAL HERNIA REPAIR Right 11/14/2019   Procedure: RIGHT INGUINAL HERNIA REPAIR WITH MESH;  Surgeon: Dareen Ebbing, MD;  Location: [facility] OR;  Service: General;  Laterality: Right;  LMA AND TAP BLOCK   JOINT REPLACEMENT Bilateral    knee replacement   L3-L4 Back surgery     LEFT ATRIAL APPENDAGE OCCLUSION N/A 05/29/2020   Procedure: LEFT ATRIAL APPENDAGE OCCLUSION;  Surgeon: Arnoldo Lapping, MD;  Location: [facility] INVASIVE CV LAB;  Service: Cardiovascular;  Laterality: N/A;   PEG TUBE PLACEMENT     sciatic nerve cyst removal     TEE WITHOUT CARDIOVERSION N/A 05/29/2020   Procedure: TRANSESOPHAGEAL ECHOCARDIOGRAM (TEE);  Surgeon: Arnoldo Lapping, MD;  Location: [facility] INVASIVE CV LAB;  Service: Cardiovascular;  Laterality: N/A;   TONSILLECTOMY     TRACHEOSTOMY     Social History:   reports that he has never smoked. He has never used smokeless tobacco. He reports current alcohol  use of about 1.0 - 2.0 standard drink of alcohol  per week. He reports that he does not use drugs.  Family History  Problem Relation Age of Onset   Diabetes Mother    Dementia Mother        Per [facility] new patient packet   Stroke Father    Diabetes Brother        Per [facility] new patient packet    Medications: Patient's Medications  New Prescriptions   No medications on file  Previous Medications   AMOXICILLIN PO    Take 1  tablet by mouth daily. Before dentist appointments.   ASCORBIC ACID (VITAMIN C) 1000 MG TABLET    Take 1,000 mg by mouth daily.   ATORVASTATIN  (LIPITOR ) 80 MG TABLET    Take 1 tablet (80 mg total) by mouth daily.   CARBAMIDE PEROXIDE (DEBROX) 6.5 % OTIC SOLUTION    Place 5 drops into both ears 2 (two) times daily.   CARBOXYMETHYLCELLULOSE SOD PF 0.5 % SOLN    Place 1 drop into both eyes in the morning and at bedtime.   FINASTERIDE  (PROSCAR ) 5 MG TABLET    Take 5 mg by mouth daily.   FUROSEMIDE  (LASIX ) 20 MG TABLET    Take 1 tablet (20 mg total)  by mouth daily. May also take an additional tablet as needed for swelling.   LEVOCETIRIZINE DIHYDROCHLORIDE (XYZAL PO)    Take 2 tablets by mouth as needed.   MELATONIN 5 MG TABS    Take 5 mg by mouth.   METOPROLOL  TARTRATE (LOPRESSOR ) 25 MG TABLET    Take 1 tablet (25 mg total) by mouth 2 (two) times daily.   MULTIPLE VITAMIN (MULTIVITAMIN ADULT PO)    Take 1 tablet by mouth daily. MATURE KIRKLAND   MULTIPLE VITAMINS-MINERALS (PRESERVISION AREDS 2 PO)    Take 1 tablet by mouth 2 (two) times daily.   OMEPRAZOLE  (PRILOSEC) 20 MG CAPSULE    Take 1 capsule (20 mg total) by mouth daily.   PEGCETACOPLAN, OPHTHALMIC, (SYFOVRE) 15 MG/0.1ML SOLN    by Intravitreal route as directed. 1 injection into the eyes Every 6 weeks.   POLYVINYL ALCOHOL  (LIQUIFILM TEARS) 1.4 % OPHTHALMIC SOLUTION    Place 1 drop into both eyes as needed for dry eyes.   STUDY - LIBREXIA-AF - APIXABAN  5 MG OR PLACEBO CAPSULE (PI-SETHI)    Take 1 capsule (5 mg total) by mouth 2 (two) times daily. Take at approximately the same time of day with or without food. Please bring bottle back with you to every visit; do not discard bottle. (FOR INVESTIGATIONAL USE ONLY). Please contact [facility] for any questions or concerns regarding this medication.   STUDY - LIBREXIA-AF - [id number] (MILVEXIAN) 100 MG OR PLACEBO TABLET (PI-SETHI)    Take 1 tablet by mouth 2 (two) times daily. Take at  approximately the same time of day with or without food. Please bring bottle back with you to every visit; do not discard bottle. (FOR INVESTIGATIONAL USE ONLY). Please contact [facility] for any questions or concerns regarding this medication.   TADALAFIL (CIALIS) 5 MG TABLET    Take 5 mg by mouth daily.  Modified Medications   No medications on file  Discontinued Medications   No medications on file    Physical Exam:  Vitals:   08/18/23 1101  Pulse: 63  Resp: 16  Temp: (!) 96.3 F (35.7 C)  SpO2: 95%  Weight: 162 lb (73.5 kg)  Height: 5\' 7"  (1.702 m)   Body mass index is 25.37 kg/m. Wt Readings from Last 3 Encounters:  08/18/23 162 lb (73.5 kg)  02/17/23 156 lb 9.6 oz (71 kg)  01/14/23 162 lb (73.5 kg)    Physical Exam Vitals reviewed.  Constitutional:      General: He is not in acute distress. HENT:     Head: Normocephalic.  Eyes:     General:        Right eye: No discharge.        Left eye: No discharge.  Cardiovascular:     Rate and Rhythm: Normal rate. Rhythm irregular.     Pulses: Normal pulses.     Heart sounds: No murmur heard.    No friction rub.  Pulmonary:     Effort: Pulmonary effort is normal. No respiratory distress.     Breath sounds: Normal breath sounds. No wheezing.  Abdominal:     General: Bowel sounds are normal. There is no distension.     Palpations: Abdomen is soft.     Tenderness: There is no abdominal tenderness.  Musculoskeletal:     Cervical back: Neck supple.     Right lower leg: No edema.     Left lower leg: No edema.  Skin:  General: Skin is warm.     Capillary Refill: Capillary refill takes less than 2 seconds.  Neurological:     General: No focal deficit present.     Mental Status: He is alert and oriented to person, place, and time.  Psychiatric:        Mood and Affect: Mood normal.     Labs reviewed: Basic Metabolic Panel: Recent Labs    02/17/23 1106  NA 139  K 4.4  CL 102  CO2 28  GLUCOSE  94  BUN 19  CREATININE 1.00  CALCIUM  9.4  TSH 2.72   Liver Function Tests: Recent Labs    02/17/23 1106  AST 35  ALT 24  BILITOT 1.4*  PROT 6.7   No results for input(s): "LIPASE", "AMYLASE" in the last 8760 hours. No results for input(s): "AMMONIA" in the last 8760 hours. CBC: Recent Labs    02/17/23 1106  WBC 5.9  NEUTROABS 3,487  HGB 14.9  HCT 44.9  MCV 101.6*  PLT 165   Lipid Panel: Recent Labs    02/17/23 1106  CHOL 106  HDL 73  LDLCALC 22  TRIG 39  CHOLHDL 1.5   TSH: Recent Labs    02/17/23 1106  TSH 2.72   A1C: Lab Results  Component Value Date   HGBA1C 5.1 01/07/2022     Assessment/Plan 1. Chronic congestive heart failure with left ventricular diastolic dysfunction (HCC) (Primary) - followed by cardiology - LVEF 60-65% 12/2021 - compensated - cont furosemide   2. Persistent atrial fibrillation (HCC) - followed by cardiology - HR< 100 - off Eliquis  due to afib/stroke medical trial> Librexia - cont metoprolol  for rate control   3. Benign prostatic hyperplasia with urinary frequency - followed by urology - cont finasteride  and tadalafil  4. Chronic cough - resolved with Zyrtec - off omeprazole   5. Ear foreign body, left, initial encounter - tip of hearing aid stuck in left ear canal - tolerated removal today  6. Hearing loss of left ear due to cerumen impaction - ongoing - start Debrox 5 gtts to left ear at bedtime - flush left ear in shower when Debrox complete  7. Memory impairment - MMSE 23/30 - no behaviors  - not on medication  Total time: 42 minutes. Greater than 50% of total time spent doing patient education regarding CHF, Afib, cough, back pain and cerumen impaction including symptom/medication management.    Next appt: Visit date not found  Joniel Graumann Darral Ellis  [facility] & Adult Medicine [phone]

## 2023-09-14 ENCOUNTER — Encounter: Payer: Self-pay | Admitting: Orthopedic Surgery

## 2024-01-17 LAB — OPHTHALMOLOGY REPORT-SCANNED

## 2024-01-18 ENCOUNTER — Ambulatory Visit: Payer: Self-pay | Admitting: Orthopedic Surgery

## 2024-01-30 ENCOUNTER — Other Ambulatory Visit (HOSPITAL_COMMUNITY): Payer: Self-pay | Admitting: Pharmacist

## 2024-01-30 MED ORDER — STUDY - LIBREXIA-AF - JNJ-70033093 (MILVEXIAN) 100 MG OR PLACEBO (PI-SETHI): 1.0000 | ORAL_TABLET | Freq: Two times a day (BID) | ORAL | 0 refills | Status: AC

## 2024-01-30 MED ORDER — STUDY - LIBREXIA-AF - APIXABAN 5 MG OR PLACEBO CAPSULE (PI-SETHI): 5.0000 mg | ORAL_CAPSULE | Freq: Two times a day (BID) | ORAL | 0 refills | Status: AC

## 2024-02-01 ENCOUNTER — Other Ambulatory Visit: Payer: Self-pay | Admitting: Internal Medicine

## 2024-02-03 ENCOUNTER — Other Ambulatory Visit: Payer: Self-pay | Admitting: Internal Medicine

## 2024-02-12 ENCOUNTER — Other Ambulatory Visit: Payer: Self-pay | Admitting: Internal Medicine

## 2024-03-03 ENCOUNTER — Other Ambulatory Visit: Payer: Self-pay | Admitting: Internal Medicine

## 2024-03-10 ENCOUNTER — Other Ambulatory Visit: Payer: Self-pay | Admitting: Internal Medicine

## 2024-03-12 ENCOUNTER — Telehealth: Payer: Self-pay | Admitting: Internal Medicine

## 2024-03-12 NOTE — Telephone Encounter (Signed)
*  STAT* If patient is at the pharmacy, call can be transferred to refill team.   1. Which medications need to be refilled? (please list name of each medication and dose if known) atorvastatin  (LIPITOR ) 80 MG tablet    2. Would you like to learn more about the convenience, safety, & potential cost savings by using the [facility]?    3. Are you open to using the [facility] (Type [facility].  ).   4. Which pharmacy/location (including street and city if local pharmacy) is medication to be sent to?  [id number] - [place], [place] - [address]       5. Do they need a 30 day or 90 day supply? 90 day

## 2024-03-13 MED ORDER — ATORVASTATIN CALCIUM 80 MG PO TABS: 80.0000 mg | ORAL_TABLET | Freq: Every day | ORAL | 0 refills | Status: DC

## 2024-03-13 NOTE — Telephone Encounter (Signed)
 Pt scheduled to see Dr. Mona 05/28/24.  Refill sent.

## 2024-03-17 ENCOUNTER — Other Ambulatory Visit: Payer: Self-pay | Admitting: Internal Medicine

## 2024-03-20 ENCOUNTER — Other Ambulatory Visit: Payer: Self-pay

## 2024-03-20 ENCOUNTER — Ambulatory Visit (INDEPENDENT_AMBULATORY_CARE_PROVIDER_SITE_OTHER)

## 2024-03-20 ENCOUNTER — Ambulatory Visit
Admission: EM | Admit: 2024-03-20 | Discharge: 2024-03-20 | Disposition: A | Discharge status: Home / Self Care | Attending: Family Medicine | Admitting: Family Medicine

## 2024-03-20 ENCOUNTER — Ambulatory Visit: Payer: Self-pay | Admitting: Nurse Practitioner

## 2024-03-20 DIAGNOSIS — R051 Acute cough: Secondary | ICD-10-CM

## 2024-03-20 DIAGNOSIS — J22 Unspecified acute lower respiratory infection: Secondary | ICD-10-CM

## 2024-03-20 MED ORDER — AZITHROMYCIN 250 MG PO TABS: 250.0000 mg | ORAL_TABLET | Freq: Every day | ORAL | 0 refills | Status: AC

## 2024-03-20 NOTE — ED Provider Notes (Signed)
 [facility]    CSN: [id number] Arrival date & time: 03/20/24  1445      History   Chief Complaint No chief complaint on file.   HPI Travis Brewer is a 89 y.o. male  presents for evaluation of URI symptoms for 2 weeks. Patient reports associated symptoms of productive cough with dark sputum and nasal drainage. Denies N/V/D, sore throat, fevers, ear pain, body aches, shortness of breath. Patient does not have a hx of asthma. Patient is not an active smoker.   Reports wife had similar symptoms.  Pt has taken his wife's Tessalon  Perles for symptoms. Pt has no other concerns at this time.   HPI  Past Medical History:  Diagnosis Date   Acute on chronic respiratory failure with hypoxia (HCC)    Bone spur    Per [facility] new patient packet   BPH (benign prostatic hyperplasia)    CAD (coronary artery disease)    Cancer (HCC)    Melonoma on left shoulder blade removed   Cerebral amyloid angiopathy (HCC)    Chronic atrial fibrillation (HCC)    Chronic congestive heart failure with left ventricular diastolic dysfunction (HCC)    Diaphragmatic paralysis    Dyspnea    Dysrhythmia    ED (erectile dysfunction)    Foot drop    Per [facility] new patient packet   Ganglion cyst    Per [facility] new patient packet   Hepatitis    Hep A   History of DVT of lower extremity    BLE DVT ~ 04/2019   Hyperplasia of prostate    Per [facility] new patient packet   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, Per [facility] new patient packet   Pneumonia    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   STROKE X 2    Patient Active Problem List   Diagnosis Date Noted   Stroke (cerebrum) (HCC) 01/07/2022   Persistent atrial fibrillation (HCC) 05/21/2020   Secondary hypercoagulable state 05/21/2020   Right inguinal hernia 11/14/2019   Acute on chronic respiratory failure with hypoxia (HCC)    Diaphragmatic paralysis    History of DVT of lower extremity    Chronic congestive heart failure with left ventricular diastolic dysfunction  (HCC)    Chronic atrial fibrillation (HCC)     Past Surgical History:  Procedure Laterality Date   ASCENDING AORTIC ANEURYSM REPAIR  02/22/2019   32 mm Hemashild graft, STJ to ascending interposition graft, resuspension of AV ([facility])   BACK SURGERY     CARDIAC CATHETERIZATION  02/15/2019   [facility]   CORONARY ARTERY BYPASS GRAFT  02/22/2019   LIMA to LAD, SVG to D1 [facility])   EYE SURGERY Bilateral    Cataract surgery   INGUINAL HERNIA REPAIR Right 11/14/2019   Procedure: RIGHT INGUINAL HERNIA REPAIR WITH MESH;  Surgeon: Belinda Cough, MD;  Location: [facility] OR;  Service: General;  Laterality: Right;  LMA AND TAP BLOCK   JOINT REPLACEMENT Bilateral    knee replacement   L3-L4 Back surgery     LEFT ATRIAL APPENDAGE OCCLUSION N/A 05/29/2020   Procedure: LEFT ATRIAL APPENDAGE OCCLUSION;  Surgeon: Wonda Sharper, MD;  Location: [facility] INVASIVE CV LAB;  Service: Cardiovascular;  Laterality: N/A;   PEG TUBE PLACEMENT     sciatic nerve cyst removal     TEE WITHOUT CARDIOVERSION N/A 05/29/2020   Procedure: TRANSESOPHAGEAL ECHOCARDIOGRAM (TEE);  Surgeon: Wonda Sharper, MD;  Location: [facility] INVASIVE CV LAB;  Service:  Cardiovascular;  Laterality: N/A;   TONSILLECTOMY     TRACHEOSTOMY         Home Medications    Prior to Admission medications   Medication Sig Start Date End Date Taking? Authorizing Provider  azithromycin  (ZITHROMAX ) 250 MG tablet Take 1 tablet (250 mg total) by mouth daily. Take first 2 tablets together, then 1 every day until finished. 03/20/24  Yes Lavone Barrientes, Jodi R, NP  AMOXICILLIN PO Take 1 tablet by mouth daily. Before dentist appointments.    [provider]  Ascorbic Acid (VITAMIN C) 1000 MG tablet Take 1,000 mg by mouth daily.    [provider]  atorvastatin  (LIPITOR ) 80 MG tablet Take 1 tablet (80 mg total) by mouth daily. 03/13/24   Hilty, Vinie BROCKS, MD  carbamide peroxide (DEBROX) 6.5 % OTIC solution Place 5 drops into  both ears 2 (two) times daily.    [provider]  Carboxymethylcellulose Sod PF 0.5 % SOLN Place 1 drop into both eyes in the morning and at bedtime.    [provider]  finasteride  (PROSCAR ) 5 MG tablet Take 5 mg by mouth daily. 02/05/21   [provider]  furosemide  (LASIX ) 20 MG tablet TAKE 1 TABLET BY MOUTH DAILY. MAY ALSO TAKE AN ADDITIONAL TABLET AS NEEDED FOR SWELLING. 03/15/24   Hilty, Vinie BROCKS, MD  Levocetirizine Dihydrochloride (XYZAL PO) Take 2 tablets by mouth as needed.    [provider]  melatonin 5 MG TABS Take 5 mg by mouth.    [provider]  metoprolol  tartrate (LOPRESSOR ) 25 MG tablet TAKE 1 TABLET(25 MG) BY MOUTH TWICE DAILY 03/20/24   Hilty, Vinie BROCKS, MD  Multiple Vitamin (MULTIVITAMIN ADULT PO) Take 1 tablet by mouth daily. MATURE KIRKLAND    [provider]  Multiple Vitamins-Minerals (PRESERVISION AREDS 2 PO) Take 1 tablet by mouth 2 (two) times daily.    [provider]  Pegcetacoplan, Ophthalmic, (SYFOVRE) 15 MG/0.1ML SOLN by Intravitreal route as directed. 1 injection into the eyes Every 6 weeks.    [provider]  polyvinyl alcohol  (LIQUIFILM TEARS) 1.4 % ophthalmic solution Place 1 drop into both eyes as needed for dry eyes. 01/08/22   de Clint Kill, Cortney E, NP  STUDY - LIBREXIA-AF - apixaban  5 mg or placebo capsule (PI-Sethi) Take 1 capsule (5 mg total) by mouth 2 (two) times daily. Take at approximately the same time of day with or without food. Please bring bottle back with you to every visit; do not discard bottle. (FOR INVESTIGATIONAL USE ONLY). Please contact [facility] for any questions or concerns regarding this medication. 01/30/24   Rosemarie Eather RAMAN, MD  STUDY - LIBREXIA-AF - [id number] (Milvexian) 100 mg or placebo tablet (PI-Sethi) Take 1 tablet by mouth 2 (two) times daily. Take at approximately the same time of day with or without food. Please bring bottle back with you  to every visit; do not discard bottle. (FOR INVESTIGATIONAL USE ONLY). Please contact [facility] for any questions or concerns regarding this medication. 01/30/24   Sethi, Pramod S, MD  tadalafil (CIALIS) 5 MG tablet Take 5 mg by mouth daily.    [provider]    Family History Family History  Problem Relation Age of Onset   Diabetes Mother    Dementia Mother        Per [facility] new patient packet   Stroke Father    Diabetes Brother        Per [facility] new patient  packet    Social History Social History   Tobacco Use   Smoking status: Never   Smokeless tobacco: Never  Vaping Use   Vaping status: Never Used  Substance Use Topics   Alcohol  use: Yes    Alcohol /week: 14.0 standard drinks of alcohol     Types: 14 Shots of liquor per week   Drug use: Never     Allergies   Quetiapine and Clonazepam   Review of Systems Review of Systems  HENT:  Positive for congestion.   Respiratory:  Positive for cough.      Physical Exam Triage Vital Signs ED Triage Vitals  Encounter Vitals Group     BP 03/20/24 1537 120/71     Girls Systolic BP Percentile --      Girls Diastolic BP Percentile --      Boys Systolic BP Percentile --      Boys Diastolic BP Percentile --      Pulse Rate 03/20/24 1537 67     Resp 03/20/24 1537 18     Temp 03/20/24 1537 98.7 F (37.1 C)     Temp Source 03/20/24 1537 Oral     SpO2 03/20/24 1537 96 %     Weight --      Height --      Head Circumference --      Peak Flow --      Pain Score 03/20/24 1534 0     Pain Loc --      Pain Education --      Exclude from Growth Chart --    No data found.  Updated Vital Signs BP 120/71   Pulse 67   Temp 98.7 F (37.1 C) (Oral)   Resp 18   SpO2 96%   Visual Acuity Right Eye Distance:   Left Eye Distance:   Bilateral Distance:    Right Eye Near:   Left Eye Near:    Bilateral Near:     Physical Exam Vitals and nursing note reviewed.  Constitutional:      General: He is not  in acute distress.    Appearance: Normal appearance. He is not ill-appearing or toxic-appearing.  HENT:     Head: Normocephalic and atraumatic.     Right Ear: Tympanic membrane and ear canal normal.     Left Ear: Tympanic membrane and ear canal normal.     Nose: Congestion present.     Mouth/Throat:     Mouth: Mucous membranes are moist.     Pharynx: No oropharyngeal exudate or posterior oropharyngeal erythema.  Eyes:     Pupils: Pupils are equal, round, and reactive to light.  Cardiovascular:     Rate and Rhythm: Normal rate and regular rhythm.     Heart sounds: Normal heart sounds.  Pulmonary:     Effort: Pulmonary effort is normal.     Breath sounds: Normal breath sounds. No wheezing, rhonchi or rales.  Musculoskeletal:     Cervical back: Normal range of motion and neck supple.  Lymphadenopathy:     Cervical: No cervical adenopathy.  Skin:    General: Skin is warm and dry.  Neurological:     General: No focal deficit present.     Mental Status: He is alert and oriented to person, place, and time.  Psychiatric:        Mood and Affect: Mood normal.        Behavior: Behavior normal.      UC Treatments / Results  Labs (  all labs ordered are listed, but only abnormal results are displayed) Labs Reviewed - No data to display  EKG   Radiology No results found.  Procedures Procedures (including critical care time)  Medications Ordered in UC Medications - No data to display  Initial Impression / Assessment and Plan / UC Course  I have reviewed the triage vital signs and the nursing notes.  Pertinent labs & imaging results that were available during my care of the patient were reviewed by me and considered in my medical decision making (see chart for details).     Reviewed exam and symptoms with patient.  Wet read of x-ray without any obvious consolidation, will contact for any positive results based on radiology overread once available.  Will do azithromycin  for  bronchitis.  He is to continue OTC cough medicines as needed PCP follow-up 2 days for recheck.  ER precautions reviewed Final Clinical Impressions(s) / UC Diagnoses   Final diagnoses:  Acute cough  Lower respiratory infection (e.g., bronchitis, pneumonia, pneumonitis, pulmonitis)     Discharge Instructions      Azithromycin  and a biotic as prescribed.  Continue rest fluids and over-the-counter cough medicine as needed.  Follow-up with your PCP in 2 days for recheck.  Please go to the emergency room for any worsening symptoms.  Hope you feel better soon!     ED Prescriptions     Medication Sig Dispense Auth. Provider   azithromycin  (ZITHROMAX ) 250 MG tablet Take 1 tablet (250 mg total) by mouth daily. Take first 2 tablets together, then 1 every day until finished. 6 tablet Katelynd Blauvelt, Jodi R, NP      PDMP not reviewed this encounter.   Loreda Myla SAUNDERS, NP 03/20/24 [phone]

## 2024-03-20 NOTE — Discharge Instructions (Signed)
 Azithromycin  and a biotic as prescribed.  Continue rest fluids and over-the-counter cough medicine as needed.  Follow-up with your PCP in 2 days for recheck.  Please go to the emergency room for any worsening symptoms.  Hope you feel better soon!

## 2024-03-20 NOTE — ED Triage Notes (Signed)
 Pt c/o productive cough w/dark sputum and nasal [id number].

## 2024-04-09 ENCOUNTER — Other Ambulatory Visit: Payer: Self-pay | Admitting: Orthopedic Surgery

## 2024-04-09 MED ORDER — OSELTAMIVIR PHOSPHATE 75 MG PO CAPS: 75.0000 mg | ORAL_CAPSULE | Freq: Two times a day (BID) | ORAL | 0 refills | Status: AC

## 2024-04-10 ENCOUNTER — Telehealth: Payer: Self-pay | Admitting: Internal Medicine

## 2024-04-10 NOTE — Telephone Encounter (Signed)
" °*  STAT* If patient is at the pharmacy, call can be transferred to refill team.   1. Which medications need to be refilled? (please list name of each medication and dose if known) atorvastatin  (LIPITOR ) 80 MG tablet  furosemide  (LASIX ) 20 MG tablet  metoprolol  tartrate (LOPRESSOR ) 25 MG tablet  2. Which pharmacy/location (including street and city if local pharmacy) is medication to be sent to? [id number] - [place], [place] - [address] [facility] [address] Phone: [phone]  Fax: [phone]      3. Do they need a 30 day or 90 day supply? Pt had to r/s due to his wife having surgery and he doesn't drive  6/80/73 "

## 2024-04-11 MED ORDER — FUROSEMIDE 20 MG PO TABS: ORAL_TABLET | ORAL | 0 refills | Status: AC

## 2024-04-11 MED ORDER — ATORVASTATIN CALCIUM 80 MG PO TABS: 80.0000 mg | ORAL_TABLET | Freq: Every day | ORAL | 0 refills | Status: AC

## 2024-04-11 MED ORDER — METOPROLOL TARTRATE 25 MG PO TABS: 25.0000 mg | ORAL_TABLET | Freq: Two times a day (BID) | ORAL | 0 refills | Status: AC

## 2024-04-11 NOTE — Telephone Encounter (Signed)
 Requested Prescriptions   Signed Prescriptions Disp Refills   atorvastatin  (LIPITOR ) 80 MG tablet 90 tablet 0    Sig: Take 1 tablet (80 mg total) by mouth daily.    Authorizing Provider: MONA VINIE BROCKS    Ordering User: Gwenyth Dingee  C   furosemide  (LASIX ) 20 MG tablet 135 tablet 0    Sig: TAKE 1 TABLET BY MOUTH DAILY. MAY ALSO TAKE AN ADDITIONAL TABLET AS NEEDED FOR SWELLING    Authorizing Provider: MONA VINIE BROCKS    Ordering User: Kayon Dozier  C   metoprolol  tartrate (LOPRESSOR ) 25 MG tablet 90 tablet 0    Sig: Take 1 tablet (25 mg total) by mouth 2 (two) times daily.    Authorizing Provider: MONA VINIE BROCKS    Ordering User: Calley Drenning  C

## 2024-04-13 ENCOUNTER — Other Ambulatory Visit: Payer: Self-pay | Admitting: Internal Medicine

## 2024-05-11 ENCOUNTER — Encounter (HOSPITAL_BASED_OUTPATIENT_CLINIC_OR_DEPARTMENT_OTHER): Payer: Self-pay | Admitting: Emergency Medicine

## 2024-05-11 ENCOUNTER — Other Ambulatory Visit: Payer: Self-pay

## 2024-05-11 ENCOUNTER — Emergency Department (HOSPITAL_BASED_OUTPATIENT_CLINIC_OR_DEPARTMENT_OTHER)
Admission: EM | Admit: 2024-05-11 | Discharge: 2024-05-11 | Disposition: A | Discharge status: Home / Self Care | Attending: Emergency Medicine | Admitting: Emergency Medicine

## 2024-05-11 ENCOUNTER — Other Ambulatory Visit (HOSPITAL_BASED_OUTPATIENT_CLINIC_OR_DEPARTMENT_OTHER): Payer: Self-pay

## 2024-05-11 ENCOUNTER — Emergency Department (HOSPITAL_BASED_OUTPATIENT_CLINIC_OR_DEPARTMENT_OTHER)

## 2024-05-11 DIAGNOSIS — Z7901 Long term (current) use of anticoagulants: Secondary | ICD-10-CM | POA: Diagnosis not present

## 2024-05-11 DIAGNOSIS — R10A2 Flank pain, left side: Secondary | ICD-10-CM | POA: Diagnosis present

## 2024-05-11 DIAGNOSIS — Z85828 Personal history of other malignant neoplasm of skin: Secondary | ICD-10-CM | POA: Diagnosis not present

## 2024-05-11 DIAGNOSIS — K5732 Diverticulitis of large intestine without perforation or abscess without bleeding: Secondary | ICD-10-CM | POA: Diagnosis not present

## 2024-05-11 DIAGNOSIS — N202 Calculus of kidney with calculus of ureter: Secondary | ICD-10-CM | POA: Diagnosis not present

## 2024-05-11 DIAGNOSIS — I5032 Chronic diastolic (congestive) heart failure: Secondary | ICD-10-CM | POA: Insufficient documentation

## 2024-05-11 DIAGNOSIS — I2510 Atherosclerotic heart disease of native coronary artery without angina pectoris: Secondary | ICD-10-CM | POA: Diagnosis not present

## 2024-05-11 DIAGNOSIS — Z8673 Personal history of transient ischemic attack (TIA), and cerebral infarction without residual deficits: Secondary | ICD-10-CM | POA: Diagnosis not present

## 2024-05-11 DIAGNOSIS — N201 Calculus of ureter: Secondary | ICD-10-CM

## 2024-05-11 DIAGNOSIS — I482 Chronic atrial fibrillation, unspecified: Secondary | ICD-10-CM | POA: Diagnosis not present

## 2024-05-11 DIAGNOSIS — K5792 Diverticulitis of intestine, part unspecified, without perforation or abscess without bleeding: Secondary | ICD-10-CM

## 2024-05-11 LAB — CBC WITH DIFFERENTIAL/PLATELET
Abs Immature Granulocytes: 0.02 10*3/uL (ref 0.00–0.07)
Basophils Absolute: 0 10*3/uL (ref 0.0–0.1)
Basophils Relative: 0 %
Eosinophils Absolute: 0.1 10*3/uL (ref 0.0–0.5)
Eosinophils Relative: 2 %
HCT: 42.8 % (ref 39.0–52.0)
Hemoglobin: 14.4 g/dL (ref 13.0–17.0)
Immature Granulocytes: 0 %
Lymphocytes Relative: 21 %
Lymphs Abs: 1.2 10*3/uL (ref 0.7–4.0)
MCH: 34.1 pg — ABNORMAL HIGH (ref 26.0–34.0)
MCHC: 33.6 g/dL (ref 30.0–36.0)
MCV: 101.4 fL — ABNORMAL HIGH (ref 80.0–100.0)
Monocytes Absolute: 0.5 10*3/uL (ref 0.1–1.0)
Monocytes Relative: 9 %
Neutro Abs: 4 10*3/uL (ref 1.7–7.7)
Neutrophils Relative %: 68 %
Platelets: 131 10*3/uL — ABNORMAL LOW (ref 150–400)
RBC: 4.22 MIL/uL (ref 4.22–5.81)
RDW: 13.5 % (ref 11.5–15.5)
WBC: 5.8 10*3/uL (ref 4.0–10.5)
nRBC: 0 % (ref 0.0–0.2)

## 2024-05-11 LAB — COMPREHENSIVE METABOLIC PANEL WITH GFR
ALT: 27 U/L (ref 0–44)
AST: 38 U/L (ref 15–41)
Albumin: 4.3 g/dL (ref 3.5–5.0)
Alkaline Phosphatase: 79 U/L (ref 38–126)
Anion gap: 10 (ref 5–15)
BUN: 16 mg/dL (ref 8–23)
CO2: 25 mmol/L (ref 22–32)
Calcium: 9.1 mg/dL (ref 8.9–10.3)
Chloride: 102 mmol/L (ref 98–111)
Creatinine, Ser: 0.93 mg/dL (ref 0.61–1.24)
GFR, Estimated: >60 mL/min
Glucose, Bld: 70 mg/dL (ref 70–99)
Potassium: 4.9 mmol/L (ref 3.5–5.1)
Sodium: 137 mmol/L (ref 135–145)
Total Bilirubin: 0.9 mg/dL (ref 0.0–1.2)
Total Protein: 6.6 g/dL (ref 6.5–8.1)

## 2024-05-11 LAB — URINALYSIS, ROUTINE W REFLEX MICROSCOPIC
Bilirubin Urine: NEGATIVE
Glucose, UA: NEGATIVE mg/dL
Hgb urine dipstick: NEGATIVE
Ketones, ur: NEGATIVE mg/dL
Nitrite: NEGATIVE
Protein, ur: NEGATIVE mg/dL
Specific Gravity, Urine: 1.01 (ref 1.005–1.030)
pH: 7 (ref 5.0–8.0)

## 2024-05-11 LAB — URINALYSIS, MICROSCOPIC (REFLEX)

## 2024-05-11 LAB — LIPASE, BLOOD: Lipase: 49 U/L (ref 11–51)

## 2024-05-11 MED ORDER — SODIUM CHLORIDE 0.9 % IV BOLUS
1000.0000 mL | Freq: Once | INTRAVENOUS | Status: AC
  Administered 2024-05-11: 1000 mL via INTRAVENOUS

## 2024-05-11 MED ORDER — TAMSULOSIN HCL 0.4 MG PO CAPS: 0.4000 mg | ORAL_CAPSULE | Freq: Every day | ORAL | 0 refills | Status: AC

## 2024-05-11 MED ORDER — TAMSULOSIN HCL 0.4 MG PO CAPS
0.4000 mg | ORAL_CAPSULE | Freq: Every day | ORAL | 0 refills | Status: DC
  Filled 2024-05-11: qty 30, 30d supply, fill #0

## 2024-05-11 MED ORDER — AMOXICILLIN-POT CLAVULANATE 875-125 MG PO TABS: 1.0000 | ORAL_TABLET | Freq: Two times a day (BID) | ORAL | 0 refills | Status: AC

## 2024-05-11 MED ORDER — IOHEXOL 300 MG/ML  SOLN
100.0000 mL | Freq: Once | INTRAMUSCULAR | Status: AC | PRN
  Administered 2024-05-11: 100 mL via INTRAVENOUS

## 2024-05-11 MED ORDER — AMOXICILLIN-POT CLAVULANATE 875-125 MG PO TABS
1.0000 | ORAL_TABLET | Freq: Two times a day (BID) | ORAL | 0 refills | Status: DC
  Filled 2024-05-11: qty 14, 7d supply, fill #0

## 2024-05-11 MED ORDER — OXYCODONE HCL 5 MG PO TABS
5.0000 mg | ORAL_TABLET | Freq: Four times a day (QID) | ORAL | 0 refills | Status: DC | PRN
  Filled 2024-05-11: qty 10, 3d supply, fill #0

## 2024-05-11 MED ORDER — ONDANSETRON 4 MG PO TBDP
4.0000 mg | ORAL_TABLET | Freq: Three times a day (TID) | ORAL | 0 refills | Status: DC | PRN
  Filled 2024-05-11: qty 20, 7d supply, fill #0

## 2024-05-11 MED ORDER — OXYCODONE HCL 5 MG PO TABS: 5.0000 mg | ORAL_TABLET | Freq: Four times a day (QID) | ORAL | 0 refills | Status: AC | PRN

## 2024-05-11 MED ORDER — ONDANSETRON 4 MG PO TBDP: 4.0000 mg | ORAL_TABLET | Freq: Three times a day (TID) | ORAL | 0 refills | Status: AC | PRN

## 2024-05-11 NOTE — ED Notes (Signed)
 Patient transported to CT

## 2024-05-11 NOTE — ED Triage Notes (Signed)
 Left flank pain , Hx kidney stone . Denies urinary symptoms , no NV

## 2024-05-11 NOTE — ED Provider Notes (Signed)
 " [place] [facility] Provider Note   CSN: [id number] Arrival date & time: 05/11/24  1137     Patient presents with: Flank Pain   Travis Brewer is a 89 y.o. male.   Eight 89-year-old male presents today for concern of left flank pain.  This has been ongoing since Tuesday.  Initially he thought this was a muscle strain so he tried to treat it as such but today he could not get the pain to ease off as quick as he could the past few days so he came in for evaluation.  He does have history of kidney stones.  Has had prior 2 episodes.  He states this does feel similar.  Denies any dysuria, hematuria, or difficulty urinating.  Pain does not radiate into his groin.  He states pain is worse when he first wakes up in the morning but then improves after he has his meal.  Starts off at about 9 or 10/10 and improves to about a 5/10 after eating.  The history is provided by the patient. No language interpreter was used.       Prior to Admission medications  Medication Sig Start Date End Date Taking? Authorizing Provider  AMOXICILLIN  PO Take 1 tablet by mouth daily. Before dentist appointments.    [provider]  Ascorbic Acid (VITAMIN C) 1000 MG tablet Take 1,000 mg by mouth daily.    [provider]  atorvastatin  (LIPITOR ) 80 MG tablet Take 1 tablet (80 mg total) by mouth daily. 04/11/24   Hilty, Vinie BROCKS, MD  azithromycin  (ZITHROMAX ) 250 MG tablet Take 1 tablet (250 mg total) by mouth daily. Take first 2 tablets together, then 1 every day until finished. 03/20/24   Mayer, Jodi R, NP  carbamide peroxide (DEBROX) 6.5 % OTIC solution Place 5 drops into both ears 2 (two) times daily.    [provider]  Carboxymethylcellulose Sod PF 0.5 % SOLN Place 1 drop into both eyes in the morning and at bedtime.    [provider]  finasteride  (PROSCAR ) 5 MG tablet Take 5 mg by mouth daily. 02/05/21   [provider]  furosemide   (LASIX ) 20 MG tablet TAKE 1 TABLET BY MOUTH DAILY. MAY ALSO TAKE AN ADDITIONAL TABLET AS NEEDED FOR SWELLING 04/11/24   Hilty, Vinie BROCKS, MD  Levocetirizine Dihydrochloride (XYZAL PO) Take 2 tablets by mouth as needed.    [provider]  melatonin 5 MG TABS Take 5 mg by mouth.    [provider]  metoprolol  tartrate (LOPRESSOR ) 25 MG tablet Take 1 tablet (25 mg total) by mouth 2 (two) times daily. 04/11/24   Mona Vinie BROCKS, MD  Multiple Vitamin (MULTIVITAMIN ADULT PO) Take 1 tablet by mouth daily. MATURE KIRKLAND    [provider]  Multiple Vitamins-Minerals (PRESERVISION AREDS 2 PO) Take 1 tablet by mouth 2 (two) times daily.    [provider]  Pegcetacoplan, Ophthalmic, (SYFOVRE) 15 MG/0.1ML SOLN by Intravitreal route as directed. 1 injection into the eyes Every 6 weeks.    [provider]  polyvinyl alcohol  (LIQUIFILM TEARS) 1.4 % ophthalmic solution Place 1 drop into both eyes as needed for dry eyes. 01/08/22   de Clint Kill, Cortney E, NP  STUDY - LIBREXIA-AF - apixaban  5 mg or placebo capsule (PI-Sethi) Take 1 capsule (5 mg total) by mouth 2 (two) times daily. Take at approximately the same time of day with or without food. Please bring bottle back with  you to every visit; do not discard bottle. (FOR INVESTIGATIONAL USE ONLY). Please contact [facility] for any questions or concerns regarding this medication. 01/30/24   Rosemarie Eather RAMAN, MD  STUDY - LIBREXIA-AF - [id number] (Milvexian) 100 mg or placebo tablet (PI-Sethi) Take 1 tablet by mouth 2 (two) times daily. Take at approximately the same time of day with or without food. Please bring bottle back with you to every visit; do not discard bottle. (FOR INVESTIGATIONAL USE ONLY). Please contact [facility] for any questions or concerns regarding this medication. 01/30/24   Sethi, Pramod S, MD  tadalafil (CIALIS) 5 MG tablet Take 5 mg by mouth daily.    [provider]    Allergies: Quetiapine and Clonazepam    Review of Systems  Constitutional:  Negative for chills and fever.  Gastrointestinal:  Negative for nausea and vomiting.  Genitourinary:  Positive for flank pain. Negative for difficulty urinating, dysuria and hematuria.  All other systems reviewed and are negative.   Updated Vital Signs BP 125/81   Pulse 61   Temp 97.7 F (36.5 C)   Resp 16   Wt 74.8 kg   SpO2 100%   BMI 25.84 kg/m   Physical Exam Vitals and nursing note reviewed.  Constitutional:      General: He is not in acute distress.    Appearance: Normal appearance. He is not ill-appearing.  HENT:     Head: Normocephalic and atraumatic.     Nose: Nose normal.  Eyes:     Conjunctiva/sclera: Conjunctivae normal.  Cardiovascular:     Rate and Rhythm: Normal rate and regular rhythm.  Pulmonary:     Effort: Pulmonary effort is normal. No respiratory distress.  Abdominal:     General: There is no distension.     Palpations: Abdomen is soft.     Tenderness: There is no abdominal tenderness. There is no right CVA tenderness, left CVA tenderness or guarding.  Musculoskeletal:        General: No deformity. Normal range of motion.     Cervical back: Normal range of motion.  Skin:    Findings: No rash.  Neurological:     Mental Status: He is alert.     (all labs ordered are listed, but only abnormal results are displayed) Labs Reviewed  CBC WITH DIFFERENTIAL/PLATELET  COMPREHENSIVE METABOLIC PANEL WITH GFR  LIPASE, BLOOD  URINALYSIS, ROUTINE W REFLEX MICROSCOPIC    EKG: None  Radiology: No results found.   Procedures   Medications Ordered in the ED  sodium chloride  0.9 % bolus 1,000 mL (has no administration in time range)    Clinical Course as of 05/11/24 1646  Fri May 11, 2024  1645 Notified by our pharmacy at [facility] that they are not contracted with patient's insurance and patient would like his prescription sent to  [facility] in [place].  Change made. [AA]    Clinical Course User Index [AA] Hildegard Loge, PA-C                                 Medical Decision Making Amount and/or Complexity of Data Reviewed Labs: ordered. Radiology: ordered.  Risk Prescription drug management.   Medical Decision Making / ED Course   This patient presents to the ED for concern of left flank pain, this involves an extensive number of treatment options, and is a complaint that carries with it a high  risk of complications and morbidity.  The differential diagnosis includes nephrolithiasis, UTI, pyelonephritis, colitis, diverticulitis  MDM: 89 year old male presents today for concern of left flank pain.  He states this feels similar to his previous kidney stone however it is unusual that the pain starts intense when he first wakes up and improves after eating and then remains relatively reasonable throughout the day.  Not consistent with kidney stone.  For this reason after our discussion he agrees to pursue a CT scan with contrast as long as his kidney function is stable.  Will obtain labs and fluids.  CT with evidence of diverticulitis and a ureterolithiasis.  No evidence of UTI.  No evidence of septic stone.  CBC without leukocytosis or anemia.  CMP unremarkable.  Lipase within normal.  Discussed symptom management and follow-up with urology, and PCP.  GI referral given since this is his first episode of diverticulitis.  However he will discuss this with his PCP before establishing care with this clinic.  Patient discharged in stable condition.   Additional history obtained: -Additional history obtained from wife at bedside -External records from outside source obtained and reviewed including: Chart review including previous notes, labs, imaging, consultation notes   Lab Tests: -I ordered, reviewed, and interpreted labs.   The pertinent results include:   Labs Reviewed  CBC WITH DIFFERENTIAL/PLATELET   COMPREHENSIVE METABOLIC PANEL WITH GFR  LIPASE, BLOOD  URINALYSIS, ROUTINE W REFLEX MICROSCOPIC      EKG  EKG Interpretation Date/Time:    Ventricular Rate:    PR Interval:    QRS Duration:    QT Interval:    QTC Calculation:   R Axis:      Text Interpretation:           Imaging Studies ordered: I ordered imaging studies including CT abdomen pelvis with contrast I independently visualized and interpreted imaging. I agree with the radiologist interpretation   Medicines ordered and prescription drug management: Meds ordered this encounter  Medications   sodium chloride  0.9 % bolus 1,000 mL    -I have reviewed the patients home medicines and have made adjustments as needed . Social Determinants of Health:  Factors impacting patients care include: Good family support, good outpatient follow-up   Reevaluation: After the interventions noted above, I reevaluated the patient and found that they have :improved  Co morbidities that complicate the patient evaluation  Past Medical History:  Diagnosis Date   Acute on chronic respiratory failure with hypoxia (HCC)    Bone spur    Per [facility] new patient packet   BPH (benign prostatic hyperplasia)    CAD (coronary artery disease)    Cancer (HCC)    Melonoma on left shoulder blade removed   Cerebral amyloid angiopathy (HCC)    Chronic atrial fibrillation (HCC)    Chronic congestive heart failure with left ventricular diastolic dysfunction (HCC)    Diaphragmatic paralysis    Dyspnea    Dysrhythmia    ED (erectile dysfunction)    Foot drop    Per [facility] new patient packet   Ganglion cyst    Per [facility] new patient packet   Hepatitis    Hep A   History of DVT of lower extremity    BLE DVT ~ 04/2019   Hyperplasia of prostate    Per [facility] new patient packet   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, Per [facility] new patient packet   Pneumonia    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  Return precaution discussed.  Patient voices understanding and is in agreement with plan.  Final diagnoses:  Ureterolithiasis  Diverticulitis    ED Discharge Orders          Ordered    amoxicillin -clavulanate (AUGMENTIN ) 875-125 MG tablet  Every 12 hours        05/11/24 1614    oxyCODONE  (ROXICODONE ) 5 MG immediate release tablet  Every 6 hours PRN        05/11/24 1614    ondansetron  (ZOFRAN -ODT) 4 MG disintegrating tablet  Every 8 hours PRN        05/11/24 1614    tamsulosin  (FLOMAX ) 0.4 MG CAPS capsule  Daily        05/11/24 1614               Hildegard Loge, PA-C 05/11/24 1619    Ruthe Cornet, DO 05/11/24 1831  "

## 2024-05-11 NOTE — Discharge Instructions (Signed)
 You have diverticulitis which is likely causing your pain.  On the right side you also have a kidney stone.  You may develop pain from this later.  Follow-up with the urologist regarding this.  I have sent pain medication into the pharmacy.  This will make you drowsy so do not drive, combine with alcohol , or workout while you are taking this medication.  Take nausea medication as you need to.  Drink plenty of fluids.  Follow-up with your primary care doctor.  Of also listed information for gastroenterologist.  Sometimes it like to do a colonoscopy after your first episode of diverticulitis.  However you could discuss this with your primary care doctor before you follow-up with a gastroenterologist.

## 2024-05-15 ENCOUNTER — Encounter: Payer: Self-pay | Admitting: Gastroenterology

## 2024-05-15 ENCOUNTER — Encounter: Payer: Self-pay | Admitting: Orthopedic Surgery

## 2024-05-24 ENCOUNTER — Ambulatory Visit: Payer: Self-pay | Admitting: Orthopedic Surgery

## 2024-05-28 ENCOUNTER — Ambulatory Visit: Admitting: Internal Medicine

## 2024-06-12 ENCOUNTER — Ambulatory Visit: Admitting: Gastroenterology

## 2024-06-28 ENCOUNTER — Ambulatory Visit: Admitting: Internal Medicine
